# Patient Record
Sex: Male | Born: 1965 | Race: Black or African American | Hispanic: No | Marital: Married | Smoking: Never smoker
Health system: Southern US, Community
[De-identification: ages and names within clinical notes are randomized; demographics above are authoritative.]

## PROBLEM LIST (undated history)

## (undated) DIAGNOSIS — T7840XA Allergy, unspecified, initial encounter: Secondary | ICD-10-CM

## (undated) DIAGNOSIS — S83006A Unspecified dislocation of unspecified patella, initial encounter: Secondary | ICD-10-CM

## (undated) DIAGNOSIS — F419 Anxiety disorder, unspecified: Secondary | ICD-10-CM

## (undated) DIAGNOSIS — E669 Obesity, unspecified: Secondary | ICD-10-CM

## (undated) DIAGNOSIS — R0902 Hypoxemia: Secondary | ICD-10-CM

## (undated) DIAGNOSIS — N200 Calculus of kidney: Secondary | ICD-10-CM

## (undated) DIAGNOSIS — J309 Allergic rhinitis, unspecified: Secondary | ICD-10-CM

## (undated) DIAGNOSIS — G4730 Sleep apnea, unspecified: Secondary | ICD-10-CM

## (undated) DIAGNOSIS — K649 Unspecified hemorrhoids: Secondary | ICD-10-CM

## (undated) DIAGNOSIS — K219 Gastro-esophageal reflux disease without esophagitis: Secondary | ICD-10-CM

## (undated) DIAGNOSIS — K529 Noninfective gastroenteritis and colitis, unspecified: Secondary | ICD-10-CM

## (undated) DIAGNOSIS — G4733 Obstructive sleep apnea (adult) (pediatric): Secondary | ICD-10-CM

## (undated) DIAGNOSIS — M109 Gout, unspecified: Secondary | ICD-10-CM

## (undated) HISTORY — PX: HEMORRHOID SURGERY: SHX153

## (undated) HISTORY — DX: Unspecified dislocation of unspecified patella, initial encounter: S83.006A

## (undated) HISTORY — PX: WISDOM TOOTH EXTRACTION: SHX21

## (undated) HISTORY — DX: Allergy, unspecified, initial encounter: T78.40XA

## (undated) HISTORY — DX: Unspecified hemorrhoids: K64.9

## (undated) HISTORY — DX: Hypoxemia: R09.02

## (undated) HISTORY — DX: Obesity, unspecified: E66.9

## (undated) HISTORY — DX: Anxiety disorder, unspecified: F41.9

## (undated) HISTORY — DX: Gout, unspecified: M10.9

## (undated) HISTORY — DX: Obstructive sleep apnea (adult) (pediatric): G47.33

## (undated) HISTORY — DX: Calculus of kidney: N20.0

## (undated) HISTORY — DX: Noninfective gastroenteritis and colitis, unspecified: K52.9

## (undated) HISTORY — DX: Sleep apnea, unspecified: G47.30

## (undated) HISTORY — DX: Gastro-esophageal reflux disease without esophagitis: K21.9

## (undated) HISTORY — DX: Allergic rhinitis, unspecified: J30.9

---

## 1998-07-11 ENCOUNTER — Ambulatory Visit: Admission: RE | Admit: 1998-07-11 | Discharge: 1998-07-11 | Payer: Self-pay | Admitting: Internal Medicine

## 1998-12-23 ENCOUNTER — Ambulatory Visit: Admission: RE | Admit: 1998-12-23 | Discharge: 1998-12-23 | Payer: Self-pay | Admitting: Pulmonary Disease

## 2000-08-19 ENCOUNTER — Encounter: Admission: RE | Admit: 2000-08-19 | Discharge: 2000-08-19 | Payer: Self-pay | Admitting: Internal Medicine

## 2000-08-19 ENCOUNTER — Encounter: Payer: Self-pay | Admitting: Internal Medicine

## 2004-05-19 DIAGNOSIS — N200 Calculus of kidney: Secondary | ICD-10-CM

## 2004-05-19 HISTORY — DX: Calculus of kidney: N20.0

## 2004-06-07 ENCOUNTER — Ambulatory Visit: Payer: Self-pay | Admitting: Internal Medicine

## 2004-06-08 ENCOUNTER — Ambulatory Visit: Payer: Self-pay | Admitting: Cardiology

## 2005-12-12 ENCOUNTER — Ambulatory Visit: Payer: Self-pay | Admitting: Internal Medicine

## 2006-02-03 ENCOUNTER — Ambulatory Visit: Payer: Self-pay | Admitting: Internal Medicine

## 2006-02-03 LAB — CONVERTED CEMR LAB
ALT: 22 units/L (ref 0–40)
AST: 20 units/L (ref 0–37)
Albumin: 4 g/dL (ref 3.5–5.2)
Alkaline Phosphatase: 78 units/L (ref 39–117)
BUN: 11 mg/dL (ref 6–23)
Basophils Absolute: 0.1 10*3/uL (ref 0.0–0.1)
Basophils Relative: 0.4 % (ref 0.0–1.0)
Bilirubin Urine: NEGATIVE
CO2: 28 meq/L (ref 19–32)
Calcium: 9.3 mg/dL (ref 8.4–10.5)
Chloride: 107 meq/L (ref 96–112)
Chol/HDL Ratio, serum: 3.3
Cholesterol: 110 mg/dL (ref 0–200)
Creatinine, Ser: 1.2 mg/dL (ref 0.4–1.5)
Eosinophil percent: 1.7 % (ref 0.0–5.0)
GFR calc non Af Amer: 71 mL/min
Glomerular Filtration Rate, Af Am: 86 mL/min/{1.73_m2}
Glucose, Bld: 103 mg/dL — ABNORMAL HIGH (ref 70–99)
HCT: 37.5 % — ABNORMAL LOW (ref 39.0–52.0)
HDL: 33.5 mg/dL — ABNORMAL LOW (ref >39.0)
Hemoglobin, Urine: NEGATIVE
Hemoglobin: 11.7 g/dL — ABNORMAL LOW (ref 13.0–17.0)
Ketones, ur: NEGATIVE mg/dL
LDL Cholesterol: 69 mg/dL (ref 0–99)
Leukocytes, UA: NEGATIVE
Lymphocytes Relative: 22.5 % (ref 12.0–46.0)
MCHC: 31.3 g/dL (ref 30.0–36.0)
MCV: 68.8 fL — ABNORMAL LOW (ref 78.0–100.0)
Monocytes Absolute: 0.6 10*3/uL (ref 0.2–0.7)
Monocytes Relative: 5.8 % (ref 3.0–11.0)
Neutro Abs: 6.7 10*3/uL (ref 1.4–7.7)
Neutrophils Relative %: 69.6 % (ref 43.0–77.0)
Nitrite: NEGATIVE
Platelets: 241 10*3/uL (ref 150–400)
Potassium: 4.1 meq/L (ref 3.5–5.1)
RBC: 5.44 M/uL (ref 4.22–5.81)
RDW: 16.7 % — ABNORMAL HIGH (ref 11.5–14.6)
Sodium: 142 meq/L (ref 135–145)
Specific Gravity, Urine: >=1.03 (ref 1.000–1.03)
TSH: 1.76 microintl units/mL (ref 0.35–5.50)
Total Bilirubin: 1.2 mg/dL (ref 0.3–1.2)
Total Protein, Urine: NEGATIVE mg/dL
Total Protein: 6.9 g/dL (ref 6.0–8.3)
Triglyceride fasting, serum: 36 mg/dL (ref 0–149)
Urine Glucose: NEGATIVE mg/dL
Urobilinogen, UA: 0.2 (ref 0.0–1.0)
VLDL: 7 mg/dL (ref 0–40)
WBC: 9.7 10*3/uL (ref 4.5–10.5)
pH: 6 (ref 5.0–8.0)

## 2006-02-07 ENCOUNTER — Ambulatory Visit: Payer: Self-pay | Admitting: Internal Medicine

## 2006-06-02 ENCOUNTER — Emergency Department (HOSPITAL_COMMUNITY): Admission: EM | Admit: 2006-06-02 | Discharge: 2006-06-02 | Payer: Self-pay | Admitting: Family Medicine

## 2007-04-28 ENCOUNTER — Encounter: Payer: Self-pay | Admitting: *Deleted

## 2007-04-28 DIAGNOSIS — G4733 Obstructive sleep apnea (adult) (pediatric): Secondary | ICD-10-CM | POA: Insufficient documentation

## 2007-04-28 DIAGNOSIS — Z8719 Personal history of other diseases of the digestive system: Secondary | ICD-10-CM

## 2007-04-28 DIAGNOSIS — S83006A Unspecified dislocation of unspecified patella, initial encounter: Secondary | ICD-10-CM

## 2007-04-28 DIAGNOSIS — Z9189 Other specified personal risk factors, not elsewhere classified: Secondary | ICD-10-CM | POA: Insufficient documentation

## 2007-04-28 DIAGNOSIS — J309 Allergic rhinitis, unspecified: Secondary | ICD-10-CM | POA: Insufficient documentation

## 2007-09-14 ENCOUNTER — Ambulatory Visit: Payer: Self-pay | Admitting: Internal Medicine

## 2007-09-14 DIAGNOSIS — B079 Viral wart, unspecified: Secondary | ICD-10-CM | POA: Insufficient documentation

## 2008-06-16 ENCOUNTER — Ambulatory Visit: Payer: Self-pay | Admitting: Internal Medicine

## 2008-06-16 LAB — CONVERTED CEMR LAB
ALT: 26 units/L (ref 0–53)
AST: 24 units/L (ref 0–37)
Albumin: 4.4 g/dL (ref 3.5–5.2)
Alkaline Phosphatase: 74 units/L (ref 39–117)
BUN: 11 mg/dL (ref 6–23)
Basophils Absolute: 0 10*3/uL (ref 0.0–0.1)
Basophils Relative: 0.2 % (ref 0.0–3.0)
Bilirubin Urine: NEGATIVE
Bilirubin, Direct: 0.3 mg/dL (ref 0.0–0.3)
CO2: 32 meq/L (ref 19–32)
Calcium: 9.6 mg/dL (ref 8.4–10.5)
Chloride: 107 meq/L (ref 96–112)
Cholesterol: 130 mg/dL (ref 0–200)
Creatinine, Ser: 1.1 mg/dL (ref 0.4–1.5)
Eosinophils Absolute: 0.1 10*3/uL (ref 0.0–0.7)
Eosinophils Relative: 1.1 % (ref 0.0–5.0)
GFR calc non Af Amer: 93.67 mL/min (ref >60)
Glucose, Bld: 82 mg/dL (ref 70–99)
HCT: 36.6 % — ABNORMAL LOW (ref 39.0–52.0)
HDL: 37.3 mg/dL — ABNORMAL LOW (ref >39.00)
Hemoglobin, Urine: NEGATIVE
Hemoglobin: 11.8 g/dL — ABNORMAL LOW (ref 13.0–17.0)
Ketones, ur: NEGATIVE mg/dL
LDL Cholesterol: 85 mg/dL (ref 0–99)
Leukocytes, UA: NEGATIVE
Lymphocytes Relative: 16.7 % (ref 12.0–46.0)
Lymphs Abs: 2 10*3/uL (ref 0.7–4.0)
MCHC: 32.2 g/dL (ref 30.0–36.0)
MCV: 68.4 fL — ABNORMAL LOW (ref 78.0–100.0)
Monocytes Absolute: 0.6 10*3/uL (ref 0.1–1.0)
Monocytes Relative: 5.3 % (ref 3.0–12.0)
Neutro Abs: 9.1 10*3/uL — ABNORMAL HIGH (ref 1.4–7.7)
Neutrophils Relative %: 76.7 % (ref 43.0–77.0)
Nitrite: NEGATIVE
PSA: 0.38 ng/mL (ref 0.10–4.00)
Platelets: 207 10*3/uL (ref 150.0–400.0)
Potassium: 4.3 meq/L (ref 3.5–5.1)
RBC: 5.35 M/uL (ref 4.22–5.81)
RDW: 18.1 % — ABNORMAL HIGH (ref 11.5–14.6)
Sodium: 142 meq/L (ref 135–145)
Specific Gravity, Urine: 1.015 (ref 1.000–1.030)
TSH: 1.43 microintl units/mL (ref 0.35–5.50)
Total Bilirubin: 1.8 mg/dL — ABNORMAL HIGH (ref 0.3–1.2)
Total CHOL/HDL Ratio: 3
Total Protein, Urine: NEGATIVE mg/dL
Total Protein: 7.7 g/dL (ref 6.0–8.3)
Triglycerides: 40 mg/dL (ref 0.0–149.0)
Urine Glucose: NEGATIVE mg/dL
Urobilinogen, UA: 1 (ref 0.0–1.0)
VLDL: 8 mg/dL (ref 0.0–40.0)
WBC: 11.8 10*3/uL — ABNORMAL HIGH (ref 4.5–10.5)
pH: 6.5 (ref 5.0–8.0)

## 2008-06-20 ENCOUNTER — Ambulatory Visit: Payer: Self-pay | Admitting: Internal Medicine

## 2008-12-08 ENCOUNTER — Ambulatory Visit: Payer: Self-pay | Admitting: Internal Medicine

## 2009-08-02 ENCOUNTER — Encounter: Payer: Self-pay | Admitting: Internal Medicine

## 2009-08-15 ENCOUNTER — Ambulatory Visit: Payer: Self-pay | Admitting: Internal Medicine

## 2009-08-15 LAB — CONVERTED CEMR LAB
ALT: 49 units/L (ref 0–53)
AST: 32 units/L (ref 0–37)
Albumin: 4.2 g/dL (ref 3.5–5.2)
Alkaline Phosphatase: 62 units/L (ref 39–117)
BUN: 18 mg/dL (ref 6–23)
Basophils Absolute: 0 10*3/uL (ref 0.0–0.1)
Basophils Relative: 0.3 % (ref 0.0–3.0)
Bilirubin Urine: NEGATIVE
Bilirubin, Direct: 0.2 mg/dL (ref 0.0–0.3)
CO2: 29 meq/L (ref 19–32)
Calcium: 9.1 mg/dL (ref 8.4–10.5)
Chloride: 106 meq/L (ref 96–112)
Cholesterol: 120 mg/dL (ref 0–200)
Creatinine, Ser: 1.1 mg/dL (ref 0.4–1.5)
Eosinophils Absolute: 0.1 10*3/uL (ref 0.0–0.7)
Eosinophils Relative: 1.2 % (ref 0.0–5.0)
GFR calc non Af Amer: 96.19 mL/min (ref >60)
Glucose, Bld: 91 mg/dL (ref 70–99)
HCT: 37.2 % — ABNORMAL LOW (ref 39.0–52.0)
HDL: 29.2 mg/dL — ABNORMAL LOW (ref >39.00)
Hemoglobin, Urine: NEGATIVE
Hemoglobin: 11.9 g/dL — ABNORMAL LOW (ref 13.0–17.0)
Hgb A1c MFr Bld: 5.9 % (ref 4.6–6.5)
Ketones, ur: NEGATIVE mg/dL
LDL Cholesterol: 76 mg/dL (ref 0–99)
Leukocytes, UA: NEGATIVE
Lymphocytes Relative: 19 % (ref 12.0–46.0)
Lymphs Abs: 2.1 10*3/uL (ref 0.7–4.0)
MCHC: 31.8 g/dL (ref 30.0–36.0)
MCV: 70.6 fL — ABNORMAL LOW (ref 78.0–100.0)
Monocytes Absolute: 0.6 10*3/uL (ref 0.1–1.0)
Monocytes Relative: 5.5 % (ref 3.0–12.0)
Neutro Abs: 8.1 10*3/uL — ABNORMAL HIGH (ref 1.4–7.7)
Neutrophils Relative %: 74 % (ref 43.0–77.0)
Nitrite: NEGATIVE
PSA: 0.4 ng/mL (ref 0.10–4.00)
Platelets: 258 10*3/uL (ref 150.0–400.0)
Potassium: 4.7 meq/L (ref 3.5–5.1)
RBC: 5.28 M/uL (ref 4.22–5.81)
RDW: 18.7 % — ABNORMAL HIGH (ref 11.5–14.6)
Sodium: 140 meq/L (ref 135–145)
Specific Gravity, Urine: 1.025 (ref 1.000–1.030)
TSH: 1 microintl units/mL (ref 0.35–5.50)
Total Bilirubin: 1.4 mg/dL — ABNORMAL HIGH (ref 0.3–1.2)
Total CHOL/HDL Ratio: 4
Total Protein, Urine: NEGATIVE mg/dL
Total Protein: 7 g/dL (ref 6.0–8.3)
Triglycerides: 75 mg/dL (ref 0.0–149.0)
Urine Glucose: NEGATIVE mg/dL
Urobilinogen, UA: 0.2 (ref 0.0–1.0)
VLDL: 15 mg/dL (ref 0.0–40.0)
WBC: 10.9 10*3/uL — ABNORMAL HIGH (ref 4.5–10.5)
pH: 6 (ref 5.0–8.0)

## 2009-08-17 ENCOUNTER — Ambulatory Visit: Payer: Self-pay | Admitting: Internal Medicine

## 2010-03-22 NOTE — Assessment & Plan Note (Signed)
Summary: physical--stc   Vital Signs:  Patient profile:   45 year old male Height:      70 inches Weight:      312 pounds BMI:     44.93 O2 Sat:      96 % on Room air Temp:     98.2 degrees F oral Pulse rate:   71 / minute BP sitting:   114 / 76  (left arm) Cuff size:   large  Vitals Entered By: Bill Salinas CMA (August 17, 2009 1:36 PM)  O2 Flow:  Room air CC: Pt here for CPX/ ab  Vision Screening:      Vision Comments: Normal eye exam April 2011   Primary Care Provider:  Jacques Navy MD  CC:  Pt here for CPX/ ab.  History of Present Illness: Patient presents for routine medical exam. IN the interval since his last visit he has been seen once for abdominal  pain which has resolved. He has no other intercurrent illness, injury or surgery. He has not been able to gain control of his weight with a gain of several pounds since his last exam. He has no specific complaints.   Preventive Screening-Counseling & Management  Alcohol-Tobacco     Alcohol drinks/day: 0     Smoking Status: never     Smoking Cessation Counseling: no  Current Medications (verified): 1)  Multivitamins   Tabs (Multiple Vitamin) .... Take One Tablet Once Daily 2)  Allegra 180 Mg  Tabs (Fexofenadine Hcl) .... Take One Tablet Once Daily 3)  Astepro 0.15 % Soln (Azelastine Hcl) .... Use As Directed 4)  Nasonex 50 Mcg/act  Susp (Mometasone Furoate) .... Use Daily As Directed. 5)  Sulfamethoxazole-Tmp Ds 800-160 Mg Tabs (Sulfamethoxazole-Trimethoprim) .Marland Kitchen.. 1 By Mouth Two Times A Day X 5 For Cystitis  Allergies (verified): No Known Drug Allergies  Past History:  Past Medical History: Last updated: 04/28/2007 ALLERGIC RHINITIS (ICD-477.9) HEMORRHOIDS, HX OF (ICD-V12.79) GASTROENTERITIS, HX OF (ICD-V12.79) SLEEP APNEA (ICD-780.57) Hx of PATELLAR DISLOCATION, RIGHT (ICD-836.3)  Past Surgical History: Last updated: 04/28/2007 * HEMORRHOID INCISION AND DRAINAGE. WISDOM TEETH EXTRACTION, HX OF  (ICD-V15.9)    Family History: Last updated: 06/20/2008 father - 1940: CVA at 73; early dementia; HTN;  mother - 61: DM; chronic anemia; immune deficiency problem;  Neg-prostate or colon cancer.  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   Risk Factors: Alcohol Use: 0 (08/17/2009) Caffeine Use: 3 caffeinated beverages/wk (06/20/2008) Diet: needs low calorie diet.  (06/20/2008) Exercise: no (06/20/2008)  Risk Factors: Smoking Status: never (08/17/2009)  Review of Systems  The patient denies anorexia, fever, weight loss, weight gain, vision loss, decreased hearing, hoarseness, chest pain, syncope, dyspnea on exertion, peripheral edema, prolonged cough, headaches, abdominal pain, severe indigestion/heartburn, incontinence, genital sores, muscle weakness, suspicious skin lesions, difficulty walking, depression, abnormal bleeding, enlarged lymph nodes, angioedema, and testicular masses.    Physical Exam  General:  Obese AA male in no distress Head:  Normocephalic and atraumatic without obvious abnormalities. No apparent alopecia or balding. Eyes:  No corneal or conjunctival inflammation noted. EOMI. Perrla. Funduscopic exam benign, without hemorrhages, exudates or papilledema. Vision grossly normal. Ears:  External ear exam shows no significant lesions or deformities.  Otoscopic examination reveals clear canals, tympanic membranes  are intact bilaterally without bulging, retraction, inflammation or discharge. Hearing is grossly normal bilaterally. Nose:  no external deformity and no external erythema.   Mouth:  Oral mucosa and oropharynx without lesions or exudates.  Teeth in good repair. Neck:  supple, full ROM, no thyromegaly, and no carotid  bruits.   Chest Wall:  No deformities, masses, tenderness or gynecomastia noted. Lungs:  Normal respiratory effort, chest expands symmetrically. Lungs are clear to auscultation, no crackles or wheezes. Heart:  Normal rate and regular rhythm. S1 and S2 normal without gallop, murmur, click, rub or other extra sounds. Abdomen:  obese, soft, non-tender, and normal bowel sounds.   Prostate:  deferred to normal PSA Msk:  normal ROM, no joint tenderness, no joint swelling, no joint warmth, no redness over joints, and no joint instability.   Pulses:  2+ radial pulses Extremities:  1+ distal LE edema, increased distal LE rubor Neurologic:  alert & oriented X3, cranial nerves II-XII intact, strength normal in all extremities, sensation intact to light touch, and gait normal.   Skin:  turgor normal, color normal, and no suspicious lesions.   Cervical Nodes:  no anterior cervical adenopathy and no posterior cervical adenopathy.   Psych:  Oriented X3, memory intact for recent and remote, normally interactive, and good eye contact.     Impression & Recommendations:  Problem # 1:  OBESITY, CLASS II (ICD-278.00) revisited counseling about weight management: Smart food choices; PORTION SIZE, PORTION SIZE, PORTION SIZE; regular exercise. [employer] -  250 lbs, GOAL to loose 2 lbs/month.  Problem # 2:  ALLERGIC RHINITIS (ICD-477.9) stable   His updated medication list for this problem includes:    Allegra 180 Mg Tabs (Fexofenadine hcl) .Marland Kitchen... Take one tablet once daily    Astepro 0.15 % Soln (Azelastine hcl) ..... Use as directed    Nasonex 50 Mcg/act Susp (Mometasone furoate) ..... Use daily as directed.  Problem # 3:  SLEEP APNEA (ICD-780.57) no report by patient of any on-going problems.   Problem # 4:  Preventive Health Care (ICD-V70.0) Unremarkable exam except for obesity. Lab results are normal. Last tetnus '06  In summary - a very nice man who is medically stable with the primary medical issue being  weight management.   Complete Medication List: 1)  Multivitamins Tabs (Multiple vitamin) .... Take one tablet once daily 2)  Allegra 180 Mg Tabs (Fexofenadine hcl) .... Take one tablet once daily 3)  Astepro 0.15 % Soln (Azelastine hcl) .... Use as directed 4)  Nasonex 50 Mcg/act Susp (Mometasone furoate) .... Use daily as directed. 5)  Sulfamethoxazole-tmp Ds 800-160 Mg Tabs (Sulfamethoxazole-trimethoprim) .Marland Kitchen.. 1 by mouth two times a day x 5 for cystitis  Patient: Randy Hendrix Note: All result statuses are Final unless otherwise noted.  Tests: (1) BMP (METABOL)   Sodium                    140 mEq/L                   135-145   Potassium                 4.7 mEq/L                   3.5-5.1   Chloride                  106 mEq/L  96-112   Carbon Dioxide            29 mEq/L                    19-32   Glucose                   91 mg/dL                    04-54   BUN                       18 mg/dL                    0-98   Creatinine                1.1 mg/dL                   1.1-9.1   Calcium                   9.1 mg/dL                   4.7-82.9   GFR                       96.19 mL/min                >60  Tests: (2) Lipid Panel (LIPID)   Cholesterol               120 mg/dL                   5-621     ATP III Classification            Desirable:  < 200 mg/dL                    Borderline High:  200 - 239 mg/dL               High:  > = 240 mg/dL   Triglycerides             75.0 mg/dL                  3.0-865.7     Normal:  <150 mg/dL     Borderline High:  846 - 199 mg/dL   HDL                  [L]  96.29 mg/dL                 >52.84   VLDL Cholesterol          15.0 mg/dL                  1.3-24.4   LDL Cholesterol           76 mg/dL                    0-10  CHO/HDL Ratio:  CHD Risk                             4                    Men          Women     1/2 Average  Risk     3.4          3.3     Average Risk          5.0          4.4     2X Average Risk           9.6          7.1     3X Average Risk          15.0          11.0                           Tests: (3) CBC Platelet w/Diff (CBCD)   White Cell Count     [H]  10.9 K/uL                   4.5-10.5   Red Cell Count            5.28 Mil/uL                 4.22-5.81   Hemoglobin           [L]  11.9 g/dL                   04.5-40.9   Hematocrit           [L]  37.2 %                      39.0-52.0   MCV                  [L]  70.6 fl                     78.0-100.0     Rechecked and verified result.   MCHC                      31.8 g/dL                   81.1-91.4   RDW                  [H]  18.7 %                      11.5-14.6   Platelet Count            258.0 K/uL                  150.0-400.0   Neutrophil %              74.0 %                      43.0-77.0   Lymphocyte %              19.0 %                      12.0-46.0   Monocyte %                5.5 %                       3.0-12.0   Eosinophils%              1.2 %  0.0-5.0   Basophils %               0.3 %                       0.0-3.0   Neutrophill Absolute [H]  8.1 K/uL                    1.4-7.7   Lymphocyte Absolute       2.1 K/uL                    0.7-4.0   Monocyte Absolute         0.6 K/uL                    0.1-1.0  Eosinophils, Absolute                             0.1 K/uL                    0.0-0.7   Basophils Absolute        0.0 K/uL                    0.0-0.1  Tests: (4) Hepatic/Liver Function Panel (HEPATIC)   Total Bilirubin      [H]  1.4 mg/dL                   4.0-9.8   Direct Bilirubin          0.2 mg/dL                   1.1-9.1   Alkaline Phosphatase      62 U/L                      39-117   AST                       32 U/L                      0-37   ALT                       49 U/L                      0-53   Total Protein             7.0 g/dL                    4.7-8.2   Albumin                   4.2 g/dL                    9.5-6.2  Tests: (5) TSH (TSH)   FastTSH                   1.00  uIU/mL                 0.35-5.50  Tests: (6) Prostate Specific Antigen (PSA)   PSA-Hyb                   0.40 ng/mL  0.10-4.00  Tests: (7) UDip Only (UDIP)   Color                     LT. YELLOW       RANGE:  Yellow;Lt. Yellow   Clarity                   CLEAR                       Clear   Specific Gravity          1.025                       1.000 - 1.030   Urine Ph                  6.0                         5.0-8.0   Protein                   NEGATIVE                    Negative   Urine Glucose             NEGATIVE                    Negative   Ketones                   NEGATIVE                    Negative   Urine Bilirubin           NEGATIVE                    Negative   Blood                     NEGATIVE                    Negative   Urobilinogen              0.2                         0.0 - 1.0   Leukocyte Esterace        NEGATIVE                    Negative   Nitrite                   NEGATIVE                    Negative  Tests: (8) Hemoglobin A1C (A1C)   Hemoglobin A1C            5.9 %                       4.6-6.5

## 2010-03-22 NOTE — Letter (Signed)
Summary: CMN/[facility]  CMN/[facility]   Imported By: Lester [place] 08/04/2009 07:44:52  _____________________________________________________________________  External Attachment:    Type:   Image     Comment:   External Document

## 2010-07-06 NOTE — Assessment & Plan Note (Signed)
[facility]                           PRIMARY CARE OFFICE NOTE   NAME:Randy Hendrix                MRN:          [id number]  DATE:02/09/2006                            DOB:          Dec 19, 1965    [name] is a pleasant 45 year old African-American gentleman who  presents for routine followup evaluation and exam.  Last physical exam  was March 05, 2003.  The patient's last office visit was December 12, 2005 for painful hemorrhoid requiring incision and drainage.   The patient reports that he is now feeling well.  He has had no  recurrent problems with his hemorrhoids and he feels he has healed  completely with no rectal pain or discomfort.   PAST MEDICAL HISTORY:  Well documented in my note of March 08, 2003  without significant change.   FAMILY HISTORY/SOCIAL HISTORY:  Likewise documented.  The patient does  continue to work for [employer].  He has now been there 17  years and gets 4 weeks of vacation.  His children are very active and  healthy.   CURRENT MEDICATIONS:  Multivitamin daily, Allegra 180 mg daily, Afrin  nasal spray daily, Nasonex daily, lidocaine hydrochloride patch as  needed.  Anucort HC 2.5 mg suppositories p.r.n.   REVIEW OF SYSTEMS:  The patient has had no fevers or chills.  He has had  a mild weight loss.  We discussed weight loss program with a target of  210 over 4 years with a rate of 18 to 20 pounds per year.  The patient  has had an eye exam in the last 24 months.  The patient is scheduled for  cavity repair.  He has had no cardiovascular, respiratory, GI, GU  complaints.  He does have mild carpal tunnel on the left, not requiring  splinting.  No dermatologic or neurological problems.   EXAMINATION:  Temperature was 97.5, blood pressure 115/70, pulse 71,  weight 288, height is 5 feet 10 inches.  GENERAL APPEARANCE:  This is an overweight African-American gentleman in  no acute distress.  HEENT:  Normocephalic, atraumatic.  Unremarkable.  Pupils are equal,  round, and reactive to light and accommodation.  NECK:  Supple without thyromegaly.  NODES:  Adenopathy is not noted in the cervical or supraclavicular  regions.  CHEST:  No CVA tenderness.  LUNGS:  Clear to auscultation and percussion.  CARDIOVASCULAR:  2+ radial pulses.  He had a quiet precordium.  He had a  regular rate and rhythm without murmurs, rubs, or gallops.  He had no  JVD.  No carotid bruits.  ABDOMEN:  Obese, soft, no guarding or rebound.  No organo-splenomegaly  was noted.  GENITALIA:  Normal male.  Bilaterally descended testicles without  masses.  RECTAL:  The patient has no residual scarring from hemorrhoid incision  and drainage.  No palpable hemorrhoids are present.  He had normal  sphincter tone.  Prostate was smooth, round, normal size and contour  without tenderness.  EXTREMITIES:  Without cyanosis, clubbing, or edema, deformity.  NEUROLOGIC:  Nonfocal.   DATABASE:  Hemoglobin 11.7 g, white count 9700 with a normal  differential.  Cholesterol 110, triglycerides 26, HDL 33.5, LDL 69.  Serum glucose 103.  Electrolytes are normal.  Kidney function normal  with a creatinine of 1.2 and a glomerular filtration rate of 86 ml per  minute.  Liver function tests normal.  Thyroid function normal with a  TSH of 1.76.  Urinalysis was negative.   ASSESSMENT AND PLAN:  1. Weight management.  Discussed this at length with the patient.  I      have advised smart food choices, single portions, and portion size      control.  Avoiding empty calories in snacks.  Plan is a weight loss      of 1 to 1.5 pounds per month with a total goal of 18 to 20 pounds      per year with a target weight of 210.  2. Hemorrhoids, resolved.  3. Chronic allergies.  Stable on his present regimen.  4. Sleep apnea.  The patient is using a continuous positive airway      pressure mask and reports that he has been sleeping and  resting      better.  5. Health maintenance.  The patient is current with a normal      examination as noted.  Laboratory as noted.  6. The patient is asked to return to see me on a p.r.n. basis or in 1      year.     Rosalyn Gess Norins, MD  Electronically Signed    MEN/MedQ  DD: 02/09/2006  DT: 02/09/2006  Job #: [id number]   cc:   Adora Fridge

## 2011-03-19 ENCOUNTER — Telehealth: Payer: Self-pay | Admitting: *Deleted

## 2011-03-19 NOTE — Telephone Encounter (Signed)
Last ov May '00; last appt with Dr. Shelle Iron Nov '00. Needs OV with Dr. Shelle Iron for follow-up of CPAP

## 2011-03-19 NOTE — Telephone Encounter (Signed)
Patients wife called and left voice message stating she was advised to contact Dr Debby Bud by [facility] in [place].She is requesting a rx for cpap change.  Drinda Butts states the patients pressure is at 15.They are requesting this by Friday.

## 2011-03-20 NOTE — Telephone Encounter (Signed)
LMOM to Inform patient. 

## 2011-04-09 ENCOUNTER — Encounter: Payer: Self-pay | Admitting: Pulmonary Disease

## 2011-04-10 ENCOUNTER — Ambulatory Visit (INDEPENDENT_AMBULATORY_CARE_PROVIDER_SITE_OTHER): Payer: BC Managed Care – PPO | Admitting: Pulmonary Disease

## 2011-04-10 ENCOUNTER — Encounter: Payer: Self-pay | Admitting: Pulmonary Disease

## 2011-04-10 VITALS — BP 114/66 | HR 68 | Temp 97.7°F | Ht 70.0 in | Wt 321.4 lb

## 2011-04-10 DIAGNOSIS — G4730 Sleep apnea, unspecified: Secondary | ICD-10-CM

## 2011-04-10 NOTE — Assessment & Plan Note (Signed)
The patient has a history of very severe obstructive sleep apnea, but states that he has been doing well since his initial evaluation 13 years ago.  He has remained compliant with CPAP, but currently feels that he is not sleeping as well and is noting some daytime sleepiness.  His weight has increased by 30 pounds, and likely his pressure needs have increased.  He has a fairly new machine, but feels that it is making noise and possibly not functioning properly.  At this point, I would like to have his machine checked, get him new supplies and mask, and also re\re optimize his pressure in light of his weight gain and worsening symptoms.  I have also encouraged the patient to work aggressively on weight loss, and to followup with me on a yearly basis.

## 2011-04-10 NOTE — Progress Notes (Signed)
Subjective:    Patient ID: Randy Hendrix, male    DOB: 11/14/1965, 46 y.o.   MRN: [id number]  HPI The patient is a 46 your old male who I've been asked to see for management of obstructive sleep apnea.  He has a history of very severe sleep apnea, with an AHI of 124 events per hour.  He was started on CPAP and optimize to a pressure of 15 cm of water, but has not been seen since 2000.  He comes in today where he is having a little more of an issue with his sleep apnea, and is concerned that his machine may not be working properly.  His current machine is only 47-17 years old.  The patient states that his wife has noted an abnormal breathing pattern during sleep even on CPAP, and he states that he has had increased daytime sleepiness with periods of inactivity.  He states that his machine is louder than it used to be, and it is also noted that his weight is up 30 pounds since his last visit here.  He is currently using nasal pillows, and denies mouth opening.  His mask is only one year old by his history.  The patient's Epworth score today is 9  Sleep Questionnaire: What time do you typically go to bed?( Between what hours) 11:00-12:00 How long does it take you to fall asleep? 5 mins How many times during the night do you wake up? What time do you get out of bed to start your day? 0500 Do you drive or operate heavy machinery in your occupation? Yes How much has your weight changed (up or down) over the past two years? (In pounds) 20 lb (9.072 kg) Have you ever had a sleep study before? Yes If yes, location of study? [facility] If yes, date of study? 2000 Do you currently use CPAP? Yes If so, what pressure? ? Do you wear oxygen at any time? No     Review of Systems  Constitutional: Positive for unexpected weight change. Negative for fever.  HENT: Positive for sneezing. Negative for ear pain, nosebleeds, congestion, sore throat, rhinorrhea, trouble swallowing, dental problem, postnasal drip and sinus  pressure.   Eyes: Negative for redness and itching.  Respiratory: Negative for cough, chest tightness, shortness of breath and wheezing.   Cardiovascular: Negative for palpitations and leg swelling.  Gastrointestinal: Positive for abdominal pain. Negative for nausea and vomiting.  Genitourinary: Negative for dysuria.  Musculoskeletal: Negative for joint swelling.  Skin: Negative for rash.  Neurological: Negative for headaches.  Hematological: Does not bruise/bleed easily.  Psychiatric/Behavioral: Negative for dysphoric mood. The patient is not nervous/anxious.        Objective:   Physical Exam Constitutional:  Obese male, no acute distress  HENT:  Nares patent without discharge  Oropharynx without exudate, palate and uvula are very thickened and elongated.  Eyes:  Perrla, eomi, no scleral icterus  Neck:  No JVD, no TMG  Cardiovascular:  Normal rate, regular rhythm, no rubs or gallops.  No murmurs        Intact distal pulses  Pulmonary :  Normal breath sounds, no stridor or respiratory distress   No rales, rhonchi, or wheezing  Abdominal:  Soft, nondistended, bowel sounds present.  No tenderness noted.   Musculoskeletal:  2+ lower extremity edema noted.  Lymph Nodes:  No cervical lymphadenopathy noted  Skin:  No cyanosis noted  Neurologic:  Alert, appropriate, moves all 4 extremities without obvious deficit.  Assessment & Plan:

## 2011-04-10 NOTE — Patient Instructions (Signed)
Will have your dme check your current machine to see if in working order.  It will be up to insurance company whether you qualify for a new machine.  Will re-optimize your pressure on auto setting for the next 2 weeks.  Will call you with the results. Work on weight loss followup with me in one year, but call if having issues with cpap.

## 2011-04-12 ENCOUNTER — Emergency Department (HOSPITAL_COMMUNITY)
Admission: EM | Admit: 2011-04-12 | Discharge: 2011-04-12 | Disposition: A | Discharge status: Home / Self Care | Payer: BC Managed Care – PPO | Attending: Emergency Medicine | Admitting: Emergency Medicine

## 2011-04-12 ENCOUNTER — Encounter (HOSPITAL_COMMUNITY): Payer: Self-pay

## 2011-04-12 ENCOUNTER — Emergency Department (HOSPITAL_COMMUNITY): Payer: BC Managed Care – PPO

## 2011-04-12 ENCOUNTER — Ambulatory Visit: Payer: BC Managed Care – PPO | Admitting: Endocrinology

## 2011-04-12 DIAGNOSIS — R197 Diarrhea, unspecified: Secondary | ICD-10-CM | POA: Insufficient documentation

## 2011-04-12 DIAGNOSIS — G4733 Obstructive sleep apnea (adult) (pediatric): Secondary | ICD-10-CM | POA: Insufficient documentation

## 2011-04-12 DIAGNOSIS — R109 Unspecified abdominal pain: Secondary | ICD-10-CM | POA: Insufficient documentation

## 2011-04-12 LAB — CBC
HCT: 36.9 % — ABNORMAL LOW (ref 39.0–52.0)
Hemoglobin: 11.8 g/dL — ABNORMAL LOW (ref 13.0–17.0)
MCH: 21.7 pg — ABNORMAL LOW (ref 26.0–34.0)
MCV: 67.8 fL — ABNORMAL LOW (ref 78.0–100.0)
RBC: 5.44 MIL/uL (ref 4.22–5.81)

## 2011-04-12 LAB — URINALYSIS, ROUTINE W REFLEX MICROSCOPIC
Bilirubin Urine: NEGATIVE
Hgb urine dipstick: NEGATIVE
Specific Gravity, Urine: 1.025 (ref 1.005–1.030)
Urobilinogen, UA: 1 mg/dL (ref 0.0–1.0)
pH: 6 (ref 5.0–8.0)

## 2011-04-12 LAB — DIFFERENTIAL
Basophils Relative: 0 % (ref 0–1)
Eosinophils Relative: 1 % (ref 0–5)
Lymphs Abs: 1.6 10*3/uL (ref 0.7–4.0)
Monocytes Absolute: 0.4 10*3/uL (ref 0.1–1.0)
Neutro Abs: 8.2 10*3/uL — ABNORMAL HIGH (ref 1.7–7.7)

## 2011-04-12 LAB — COMPREHENSIVE METABOLIC PANEL
ALT: 31 U/L (ref 0–53)
CO2: 27 mEq/L (ref 19–32)
Calcium: 9.7 mg/dL (ref 8.4–10.5)
Creatinine, Ser: 1.12 mg/dL (ref 0.50–1.35)
GFR calc Af Amer: 89 mL/min — ABNORMAL LOW (ref >90)
GFR calc non Af Amer: 77 mL/min — ABNORMAL LOW (ref >90)
Glucose, Bld: 100 mg/dL — ABNORMAL HIGH (ref 70–99)
Sodium: 140 mEq/L (ref 135–145)

## 2011-04-12 MED ORDER — ONDANSETRON HCL 4 MG/2ML IJ SOLN
4.0000 mg | Freq: Once | INTRAMUSCULAR | Status: AC
  Administered 2011-04-12: 4 mg via INTRAVENOUS

## 2011-04-12 MED ORDER — SODIUM CHLORIDE 0.9 % IV SOLN
INTRAVENOUS | Status: DC
  Administered 2011-04-12: 11:00:00 via INTRAVENOUS

## 2011-04-12 MED ORDER — ONDANSETRON HCL 4 MG/2ML IJ SOLN
INTRAMUSCULAR | Status: AC
  Filled 2011-04-12: qty 2

## 2011-04-12 MED ORDER — FENTANYL CITRATE 0.05 MG/ML IJ SOLN
50.0000 ug | Freq: Once | INTRAMUSCULAR | Status: AC
  Administered 2011-04-12: 50 ug via INTRAVENOUS
  Filled 2011-04-12: qty 2

## 2011-04-12 NOTE — ED Provider Notes (Signed)
History     CSN: [id number]  Arrival date & time 04/12/11  1041   First MD Initiated Contact with Patient 04/12/11 1100      Chief Complaint  Patient presents with  . Abdominal Pain    also having diahreea and lt. rib pain     (Consider location/radiation/quality/duration/timing/severity/associated sxs/prior treatment) HPI  Past Medical History  Diagnosis Date  . Allergic rhinitis   . Hemorrhoid   . Gastroenteritis   . OSA (obstructive sleep apnea)   . Patellar dislocation     right    Past Surgical History  Procedure Date  . Hemorrhoid surgery   . Wisdom tooth extraction     Family History  Problem Relation Age of Onset  . Stroke Father   . Dementia Father   . Hypertension Father   . Diabetes Mother   . Anemia Mother   . Immunodeficiency Mother     History  Substance Use Topics  . Smoking status: Never Smoker   . Smokeless tobacco: Not on file  . Alcohol Use: No      Review of Systems  Allergies  Review of patient's allergies indicates no known allergies.  Home Medications   Current Outpatient Rx  Name Route Sig Dispense Refill  . AZELASTINE HCL 0.15 % NA SOLN Nasal Place 2 sprays into the nose daily.    Marland Kitchen VITAMIN B 12 PO Oral Take 1 tablet by mouth daily.    Marland Kitchen FEXOFENADINE HCL 180 MG PO TABS Oral Take 180 mg by mouth daily.    . OMEGA-3 FATTY ACIDS 1000 MG PO CAPS Oral Take 1 g by mouth daily.    Marland Kitchen GREEN TEA PO Oral Take 1 capsule by mouth daily.    . MOMETASONE FUROATE 50 MCG/ACT NA SUSP Nasal Place 2 sprays into the nose daily.    . MULTIVITAMINS PO CAPS Oral Take 1 capsule by mouth daily.      BP 113/69  Pulse 86  Temp(Src) 98.5 F (36.9 C) (Oral)  Resp 20  Ht 5\' 10"  (1.778 m)  Wt 312 lb (141.522 kg)  BMI 44.77 kg/m2  SpO2 98%  Physical Exam  ED Course  Procedures (including critical care time)  Labs Reviewed  CBC - Abnormal; Notable for the following:    Hemoglobin 11.8 (*)    HCT 36.9 (*)    MCV 67.8 (*)    MCH 21.7 (*)     RDW 18.5 (*)    All other components within normal limits  DIFFERENTIAL - Abnormal; Notable for the following:    Neutrophils Relative 79 (*)    Neutro Abs 8.2 (*)    All other components within normal limits  COMPREHENSIVE METABOLIC PANEL - Abnormal; Notable for the following:    Glucose, Bld 100 (*)    Total Bilirubin 1.3 (*)    GFR calc non Af Amer 77 (*)    GFR calc Af Amer 89 (*)    All other components within normal limits  URINALYSIS, ROUTINE W REFLEX MICROSCOPIC  LIPASE, BLOOD   Dg Ribs Unilateral W/chest Left  04/12/2011  *RADIOLOGY REPORT*  Clinical Data: Left rib pain after coughing  LEFT RIBS AND CHEST - 3+ VIEW  Comparison: None.  Findings: Heart and mediastinal contours are within normal limits. The lung fields appear clear with no signs of focal infiltrate or congestive failure.  No pleural fluid is suggested. No definite focal bony abnormality is identified around the thoracic cage  A metallic BB was utilized  to mark the location of the patient's pain.  Visualized ribs appear intact with the lower ribs well assessed.  No displaced or apparent nondisplaced rib fracture is identified.  Visualized thoracic and upper lumbar vertebral bodies appear intact.  IMPRESSION: No evidence for tussive rib fracture radiographically.  Clear chest.  Original Report Authenticated By: Bertha Stakes, M.D.     No diagnosis found.    MDM  Duplicate note        Doug Sou, MD 04/12/11 1721

## 2011-04-12 NOTE — ED Notes (Signed)
Complaint of diffuse abdominal pain onset yesterday pain has resolved since treatment in the emergency department. Patient reports multiple episodes watery diarrhea yesterday one episode today. No blood per him asymptomatic as I examine him on exam  alert nontoxic abdomen obese normal active bowel sounds nontender pain felt to be nonspecific. Plan bland diet for 24 hours avoid milk and milk products return as needed  Doug Sou, MD 04/12/11 1410

## 2011-04-12 NOTE — ED Provider Notes (Signed)
  Physical Exam  BP 101/84  Pulse 86  Temp(Src) 98.5 F (36.9 C) (Oral)  Resp 20  Ht 5\' 10"  (1.778 m)  Wt 312 lb (141.522 kg)  BMI 44.77 kg/m2  Physical Exam  ED Course  Procedures  MDM Patient had his left posterior rib pain after a cough couple weeks ago. Worse with moving and coughing. He also has had diarrhea for the last day. Decreased appetite but no nauseous or vomiting. No fevers. He will be      Juliet Rude. Rubin Payor, MD 04/12/11 [phone]

## 2011-04-12 NOTE — ED Notes (Signed)
Care assumed. Pt here with c/o Mid abd pain radiating to L side.denies urinary symptoms.denies nausea and vomiting but states diarrhea.states pain 3/10 at present.

## 2011-04-12 NOTE — ED Notes (Signed)
Pt states that he has been having generalized abdominal pain with nausea and diarrhea for the past couple of days. He states that recently he also tried to hold in a sneeze and felt like something pulled in his left sided rib area. He is having pain with movement and deep breathing. Alert and oriented. Protocols initiated.

## 2011-04-12 NOTE — Discharge Instructions (Signed)

## 2011-04-12 NOTE — ED Notes (Signed)
Lt. Rib pain when pt. Sneezes, denies any injury also having abdominal pain with diahreea symptoms began yesterday

## 2011-04-12 NOTE — ED Provider Notes (Signed)
History     CSN: [id number]  Arrival date & time 04/12/11  1041   First MD Initiated Contact with Patient 04/12/11 1100     11:47 AM HPI Patient reports the last 2 weeks has had pain in his left lower ribs. When he points, he points to his left lower quadrant. Reports pain is worse with sneezing, coughing, in moving. Reports yesterday had mild diarrhea. Denies nausea, vomiting, urinary symptoms, penile discharge, testicular tenderness, fevers. Denies history of abdominal surgeries. Reports pain improved after medication given in triage.  Patient is a 46 y.o. male presenting with abdominal pain. The history is provided by the patient and the spouse.  Abdominal Pain The primary symptoms of the illness include abdominal pain and diarrhea. The primary symptoms of the illness do not include fever, shortness of breath, nausea, vomiting or dysuria. Episode onset: 2 weeks. The onset of the illness was gradual. The problem has been gradually worsening.  The abdominal pain is located in the left flank. The abdominal pain does not radiate. The severity of the abdominal pain is 3/10. The abdominal pain is exacerbated by coughing and movement.  The patient has not had a change in bowel habit. Symptoms associated with the illness do not include chills, diaphoresis, heartburn, constipation, urgency, hematuria, frequency or back pain.    Past Medical History  Diagnosis Date  . Allergic rhinitis   . Hemorrhoid   . Gastroenteritis   . OSA (obstructive sleep apnea)   . Patellar dislocation     right    Past Surgical History  Procedure Date  . Hemorrhoid surgery   . Wisdom tooth extraction     Family History  Problem Relation Age of Onset  . Stroke Father   . Dementia Father   . Hypertension Father   . Diabetes Mother   . Anemia Mother   . Immunodeficiency Mother     History  Substance Use Topics  . Smoking status: Never Smoker   . Smokeless tobacco: Not on file  . Alcohol Use: No       Review of Systems  Constitutional: Negative for fever, chills and diaphoresis.  Respiratory: Negative for shortness of breath.   Cardiovascular: Negative for chest pain.  Gastrointestinal: Positive for abdominal pain and diarrhea. Negative for heartburn, nausea, vomiting, constipation, blood in stool and rectal pain.  Genitourinary: Negative for dysuria, urgency, frequency, hematuria, flank pain, discharge, penile pain and testicular pain.  Musculoskeletal: Negative for back pain.       Left rib pain  Neurological: Negative for dizziness, weakness, numbness and headaches.  All other systems reviewed and are negative.    Allergies  Review of patient's allergies indicates no known allergies.  Home Medications   Current Outpatient Rx  Name Route Sig Dispense Refill  . AZELASTINE HCL 0.15 % NA SOLN Nasal Place 2 sprays into the nose daily.    Marland Kitchen VITAMIN B 12 PO Oral Take 1 tablet by mouth daily.    Marland Kitchen FEXOFENADINE HCL 180 MG PO TABS Oral Take 180 mg by mouth daily.    . OMEGA-3 FATTY ACIDS 1000 MG PO CAPS Oral Take 1 g by mouth daily.    Marland Kitchen GREEN TEA PO Oral Take 1 capsule by mouth daily.    . MOMETASONE FUROATE 50 MCG/ACT NA SUSP Nasal Place 2 sprays into the nose daily.    . MULTIVITAMINS PO CAPS Oral Take 1 capsule by mouth daily.      BP 101/84  Pulse 86  Temp(Src)  98.5 F (36.9 C) (Oral)  Resp 20  Ht 5\' 10"  (1.778 m)  Wt 312 lb (141.522 kg)  BMI 44.77 kg/m2  Physical Exam  Vitals reviewed. Constitutional: He is oriented to person, place, and time. He appears well-developed and well-nourished.  HENT:  Head: Normocephalic and atraumatic.  Eyes: Conjunctivae are normal. Pupils are equal, round, and reactive to light.  Neck: Normal range of motion. Neck supple.  Cardiovascular: Normal rate, regular rhythm and normal heart sounds.   Pulmonary/Chest: Effort normal and breath sounds normal. No respiratory distress. He has no wheezes. He has no rales. He exhibits no  tenderness.  Abdominal: Soft. Bowel sounds are normal. He exhibits no distension and no mass. There is no tenderness. There is no rebound and no guarding.  Neurological: He is alert and oriented to person, place, and time.  Skin: Skin is warm and dry. No rash noted. No erythema. No pallor.  Psychiatric: He has a normal mood and affect. His behavior is normal.    ED Course  Procedures  Results for orders placed during the hospital encounter of 04/12/11  URINALYSIS, ROUTINE W REFLEX MICROSCOPIC      Component Value Range   Color, Urine YELLOW  YELLOW    APPearance CLEAR  CLEAR    Specific Gravity, Urine 1.025  1.005 - 1.030    pH 6.0  5.0 - 8.0    Glucose, UA NEGATIVE  NEGATIVE (mg/dL)   Hgb urine dipstick NEGATIVE  NEGATIVE    Bilirubin Urine NEGATIVE  NEGATIVE    Ketones, ur NEGATIVE  NEGATIVE (mg/dL)   Protein, ur NEGATIVE  NEGATIVE (mg/dL)   Urobilinogen, UA 1.0  0.0 - 1.0 (mg/dL)   Nitrite NEGATIVE  NEGATIVE    Leukocytes, UA NEGATIVE  NEGATIVE   CBC      Component Value Range   WBC 10.3  4.0 - 10.5 (K/uL)   RBC 5.44  4.22 - 5.81 (MIL/uL)   Hemoglobin 11.8 (*) 13.0 - 17.0 (g/dL)   HCT 16.1 (*) 09.6 - 52.0 (%)   MCV 67.8 (*) 78.0 - 100.0 (fL)   MCH 21.7 (*) 26.0 - 34.0 (pg)   MCHC 32.0  30.0 - 36.0 (g/dL)   RDW 04.5 (*) 40.9 - 15.5 (%)   Platelets 185  150 - 400 (K/uL)  DIFFERENTIAL      Component Value Range   Neutrophils Relative 79 (*) 43 - 77 (%)   Lymphocytes Relative 16  12 - 46 (%)   Monocytes Relative 4  3 - 12 (%)   Eosinophils Relative 1  0 - 5 (%)   Basophils Relative 0  0 - 1 (%)   Neutro Abs 8.2 (*) 1.7 - 7.7 (K/uL)   Lymphs Abs 1.6  0.7 - 4.0 (K/uL)   Monocytes Absolute 0.4  0.1 - 1.0 (K/uL)   Eosinophils Absolute 0.1  0.0 - 0.7 (K/uL)   Basophils Absolute 0.0  0.0 - 0.1 (K/uL)   RBC Morphology POLYCHROMASIA PRESENT    COMPREHENSIVE METABOLIC PANEL      Component Value Range   Sodium 140  135 - 145 (mEq/L)   Potassium 3.8  3.5 - 5.1 (mEq/L)    Chloride 105  96 - 112 (mEq/L)   CO2 27  19 - 32 (mEq/L)   Glucose, Bld 100 (*) 70 - 99 (mg/dL)   BUN 15  6 - 23 (mg/dL)   Creatinine, Ser 8.11  0.50 - 1.35 (mg/dL)   Calcium 9.7  8.4 - 91.4 (mg/dL)  Total Protein 7.3  6.0 - 8.3 (g/dL)   Albumin 3.9  3.5 - 5.2 (g/dL)   AST 21  0 - 37 (U/L)   ALT 31  0 - 53 (U/L)   Alkaline Phosphatase 74  39 - 117 (U/L)   Total Bilirubin 1.3 (*) 0.3 - 1.2 (mg/dL)   GFR calc non Af Amer 77 (*) >90 (mL/min)   GFR calc Af Amer 89 (*) >90 (mL/min)  LIPASE, BLOOD      Component Value Range   Lipase 18  11 - 59 (U/L)   Dg Ribs Unilateral W/chest Left  04/12/2011  *RADIOLOGY REPORT*  Clinical Data: Left rib pain after coughing  LEFT RIBS AND CHEST - 3+ VIEW  Comparison: None.  Findings: Heart and mediastinal contours are within normal limits. The lung fields appear clear with no signs of focal infiltrate or congestive failure.  No pleural fluid is suggested. No definite focal bony abnormality is identified around the thoracic cage  A metallic BB was utilized to mark the location of the patient's pain.  Visualized ribs appear intact with the lower ribs well assessed.  No displaced or apparent nondisplaced rib fracture is identified.  Visualized thoracic and upper lumbar vertebral bodies appear intact.  IMPRESSION: No evidence for tussive rib fracture radiographically.  Clear chest.  Original Report Authenticated By: Bertha Stakes, M.D.   MDM   1:41 PM History reports pain is a 1/10. Discussed differential diagnosis with patient that he may just have muscular pain pain is worse with movement, sneezing, and coughing. Also discussed it could be an early systemic infection with history of recent diarrhea. Spouse reports he is eating a lot of nuts seeds and is concerned he may have diverticulitis. Slight elevation in neutrophil count. Potentially the patient could have a diverticulitis however does not appear to be in acute distress. Discharge with muscle relaxants  antiemetics and advised followup with Dr. Alvera Novel. Discussed reasons for antibiotic use. Patient ready for discharge and voices understanding we discharged instructions.    Thomasene Lot, PA-C 04/12/11 1400

## 2011-04-12 NOTE — ED Provider Notes (Signed)
Medical screening examination/treatment/procedure(s) were conducted as a shared visit with non-physician practitioner(s) and myself.  I personally evaluated the patient during the encounter  Doug Sou, MD 04/12/11 1723

## 2011-05-20 ENCOUNTER — Encounter: Payer: Self-pay | Admitting: Internal Medicine

## 2011-05-21 ENCOUNTER — Encounter: Payer: BC Managed Care – PPO | Admitting: Internal Medicine

## 2011-05-21 DIAGNOSIS — Z0289 Encounter for other administrative examinations: Secondary | ICD-10-CM

## 2011-05-27 ENCOUNTER — Telehealth: Payer: Self-pay | Admitting: *Deleted

## 2011-05-27 ENCOUNTER — Other Ambulatory Visit (INDEPENDENT_AMBULATORY_CARE_PROVIDER_SITE_OTHER): Payer: BC Managed Care – PPO

## 2011-05-27 ENCOUNTER — Ambulatory Visit (INDEPENDENT_AMBULATORY_CARE_PROVIDER_SITE_OTHER): Payer: BC Managed Care – PPO | Admitting: Internal Medicine

## 2011-05-27 ENCOUNTER — Encounter: Payer: Self-pay | Admitting: Internal Medicine

## 2011-05-27 VITALS — BP 112/70 | HR 83 | Temp 98.0°F | Resp 16 | Ht 70.0 in | Wt 322.0 lb

## 2011-05-27 DIAGNOSIS — Z87442 Personal history of urinary calculi: Secondary | ICD-10-CM

## 2011-05-27 DIAGNOSIS — R109 Unspecified abdominal pain: Secondary | ICD-10-CM

## 2011-05-27 LAB — URINALYSIS
Hgb urine dipstick: NEGATIVE
Urine Glucose: NEGATIVE
Urobilinogen, UA: 0.2 (ref 0.0–1.0)

## 2011-05-27 MED ORDER — HYDROCODONE-ACETAMINOPHEN 10-325 MG PO TABS: 1.0000 | ORAL_TABLET | Freq: Three times a day (TID) | ORAL | Status: AC | PRN

## 2011-05-27 MED ORDER — PROMETHAZINE HCL 25 MG PO TABS: 25.0000 mg | ORAL_TABLET | Freq: Four times a day (QID) | ORAL | Status: DC | PRN

## 2011-05-27 NOTE — Telephone Encounter (Signed)
We do what we can do.

## 2011-05-27 NOTE — Patient Instructions (Signed)
Kidney Stones Kidney stones (ureteral lithiasis) are solid masses that form inside your kidneys. The intense pain is caused by the stone moving through the kidney, ureter, bladder, and urethra (urinary tract). When the stone moves, the ureter starts to spasm around the stone. The stone is usually passed in the urine.  HOME CARE  Drink enough fluids to keep your pee (urine) clear or pale yellow. This helps to get the stone out.   Strain all pee through the provided strainer. Do not pee without peeing through the strainer, not even once. If you pee the stone out, catch it. The stone may be as small as a grain of salt. Take this to your doctor.   Only take medicine as told by your doctor.   Follow up with your doctor as told.   Get follow-up X-rays as told by your doctor.  GET HELP RIGHT AWAY IF:   Your pain does not get better with medicine.   You have a fever.   Your pain increases and gets worse over 18 hours.   You have new belly (abdominal) pain.   You feel faint or pass out.  MAKE SURE YOU:   Understand these instructions.   Will watch your condition.   Will get help right away if you are not doing well or get worse.  Document Released: 07/24/2007 Document Revised: 01/24/2011 Document Reviewed: 12/02/2008 [employer] Patient Information 2012 [employer]. 

## 2011-05-27 NOTE — Progress Notes (Signed)
  Subjective:    Patient ID: Randy Hendrix, male    DOB: [birth year], 46 y.o.   MRN: [id number]  HPI complains of L flank pain Feels similar to prior kidney stone Denies overuse or precipitating injury Onset of pain symptoms 3 days ago, currently resolved Pain intensity 6/10 3 days ago, relieved with over-the-counter ibuprofen Denies radiation of pain into abdomen or groin/testicle Prior kidney stone associated with uncontrolled nausea vomiting, but no intervention required  Past Medical History  Diagnosis Date  . Allergic rhinitis   . Hemorrhoid   . Gastroenteritis   . OSA (obstructive sleep apnea)     CPAP qhs  . ALLERGIC RHINITIS   . OBESITY, CLASS II   . PATELLAR DISLOCATION, RIGHT   . Kidney stone on right side 05/2004    Review of Systems  Constitutional: Negative for fever and fatigue.  Genitourinary: Positive for flank pain. Negative for dysuria, urgency, hematuria, decreased urine volume, difficulty urinating and testicular pain.  Skin: Negative for rash and wound.       Objective:   Physical Exam BP 112/70  Pulse 83  Temp(Src) 98 F (36.7 C) (Oral)  Resp 16  Ht 5\' 10"  (1.778 m)  Wt 322 lb (146.058 kg)  BMI 46.20 kg/m2  SpO2 96% Wt Readings from Last 3 Encounters:  05/27/11 322 lb (146.058 kg)  04/12/11 312 lb (141.522 kg)  04/10/11 321 lb 6.4 oz (145.786 kg)   Constitutional:  He is oveweight, but appears well-developed and well-nourished. No distress.  Neck: Normal range of motion. Neck supple. No JVD present. No thyromegaly present.  Cardiovascular: Normal rate, regular rhythm and normal heart sounds.  No murmur heard. no BLE edema Pulmonary/Chest: Effort normal and breath sounds normal. No respiratory distress. no wheezes.  Abdominal: Soft. Bowel sounds are normal. Patient exhibits no distension. There is no tenderness.  Musculoskeletal: Back: full range of motion of thoracic and lumbar spine. Non tender to palpation. Negative straight leg raise.  DTR's are symmetrically intact. Sensation intact in all dermatomes of the lower extremities. Full strength to manual muscle testing. patient is able to heel toe walk without difficulty and ambulates with antalgic gait. Skin: Skin is warm and dry.  No erythema or ulceration.  Psychiatric: he has a normal mood and affect. behavior is normal. Judgment and thought content normal.   Lab Results  Component Value Date   WBC 10.3 04/12/2011   HGB 11.8* 04/12/2011   HCT 36.9* 04/12/2011   PLT 185 04/12/2011   GLUCOSE 100* 04/12/2011   CHOL 120 08/15/2009   TRIG 75.0 08/15/2009   HDL 29.20* 08/15/2009   LDLCALC 76 08/15/2009   ALT 31 04/12/2011   AST 21 04/12/2011   NA 140 04/12/2011   K 3.8 04/12/2011   CL 105 04/12/2011   CREATININE 1.12 04/12/2011   BUN 15 04/12/2011   CO2 27 04/12/2011   TSH 1.00 08/15/2009   PSA 0.40 08/15/2009   HGBA1C 5.9 08/15/2009      Assessment & Plan:  L flank pain Hx prior kidney stone (similar to current pain) MSkel exam benign - no history to suggest back problem  Check UA - reviewed recent labs (normal BUN/Cr 03/2011) Norco prn pain unrelieved by ibuprofen  Also promethazine if needed for nausea (none at this time) If pain symptoms uncontrolled with current medications, or if other new symptoms such as fever, uncontrolled nausea/vomiting or new abdominal pain, patient will call for reevaluation as needed

## 2011-05-27 NOTE — Telephone Encounter (Signed)
LM on both home & mobile phone numbers to inform patient that if possible for him, per MEN to come on in to office where we can do urinalysis and see if there is further need for testing today for possible kidney stone; per Harriett Sine, when wife called to schedule OV this AM, she stated that patient could not get here earlier do to work.

## 2011-06-01 ENCOUNTER — Other Ambulatory Visit: Payer: Self-pay | Admitting: Internal Medicine

## 2011-06-09 ENCOUNTER — Other Ambulatory Visit: Payer: Self-pay | Admitting: Pulmonary Disease

## 2011-06-10 ENCOUNTER — Telehealth: Payer: Self-pay | Admitting: Pulmonary Disease

## 2011-06-10 NOTE — Telephone Encounter (Signed)
Spoke to tammy @[facility] and she will redo the download on this pt and refax it Randy Hendrix

## 2011-07-25 ENCOUNTER — Ambulatory Visit (INDEPENDENT_AMBULATORY_CARE_PROVIDER_SITE_OTHER): Payer: BC Managed Care – PPO | Admitting: Internal Medicine

## 2011-07-25 ENCOUNTER — Encounter: Payer: Self-pay | Admitting: Internal Medicine

## 2011-07-25 VITALS — BP 110/68 | HR 72 | Temp 98.1°F | Resp 16 | Ht 70.0 in | Wt 315.0 lb

## 2011-07-25 DIAGNOSIS — Z0000 Encounter for general adult medical examination without abnormal findings: Secondary | ICD-10-CM

## 2011-07-25 DIAGNOSIS — G4733 Obstructive sleep apnea (adult) (pediatric): Secondary | ICD-10-CM

## 2011-07-25 DIAGNOSIS — E669 Obesity, unspecified: Secondary | ICD-10-CM

## 2011-07-28 ENCOUNTER — Encounter: Payer: Self-pay | Admitting: Internal Medicine

## 2011-07-28 DIAGNOSIS — Z0000 Encounter for general adult medical examination without abnormal findings: Secondary | ICD-10-CM | POA: Insufficient documentation

## 2011-07-28 NOTE — Assessment & Plan Note (Signed)
Tolerates and benefits from CPAP. Last saw Dr. Shelle Iron in February '13.  Plan -  no change in treatment.  Follow-up with Dr. Shelle Iron as directed.

## 2011-07-28 NOTE — Assessment & Plan Note (Signed)
Interval medical history negative. Physical exam notable for obesity, otherwise OK. Reviewed all recent lab - no indication for repeat labs. Blood sugar and cholesterol levels are in normal range. Immunizations - last tetanus in '06, good until 2016.  In summary - a very nice man who appears to be medically stable. His biggest health issue is his weight and he is willing to work on this.

## 2011-07-28 NOTE — Assessment & Plan Note (Signed)
Discussed the importance of weight management in regard to his overall health - a key risk factor and contributor to his chronic medical problems.  Plan: weight mangement - smart food choices - always taking the low calorie option, i.e. Baked not fried, pretzels over chips, etc.; PORTION SIZE CONTROL  - hand as a guide to portion size proportional to his frame; regular exercise - 3 times a week for 30 minutes with a target heart rate of 120 (multiple short episodes of exercise equal to single longer episode).  Target weight 220 lbs; goal - to loose 2 lbs per month ( 5 year project).

## 2011-07-28 NOTE — Progress Notes (Signed)
Subjective:    Patient ID: Randy Hendrix, male    DOB: 11/11/1965, 46 y.o.   MRN: [id number]  HPI Mr. Griffith presents for a wellness exam. He reports that he is feeling well and has not had any major illness, surgery or injury in the interval since his last visit. He is independent in all his activities of daily living. He has kept up with his dentist and has had an eye exam in the last 2 years. He does try to eat a healthy diet but does not have a regular exercise program.  Past Medical History  Diagnosis Date  . Allergic rhinitis   . Hemorrhoid   . Gastroenteritis   . OSA (obstructive sleep apnea)     CPAP qhs  . ALLERGIC RHINITIS   . OBESITY, CLASS II   . PATELLAR DISLOCATION, RIGHT   . Kidney stone on right side 05/2004   Past Surgical History  Procedure Date  . Hemorrhoid surgery   . Wisdom tooth extraction    Family History  Problem Relation Age of Onset  . Stroke Father   . Dementia Father   . Hypertension Father   . Diabetes Mother   . Anemia Mother   . Immunodeficiency Mother   . Colon cancer Neg Hx   . Prostate cancer Neg Hx   . Cancer Neg Hx   . Heart disease Neg Hx   . COPD Neg Hx    History   Social History  . Marital Status: Married    Spouse Name: Annetta    Number of Children: 2  . Years of Education: 12   Occupational History  . [employer] in maintenance   .  [employer]   Social History Main Topics  . Smoking status: Never Smoker   . Smokeless tobacco: Never Used  . Alcohol Use: No  . Drug Use: No  . Sexually Active: Yes -- Male partner(s)   Other Topics Concern  . 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 Married 1990. 1 daughter 2002 and 1 son 60. SO-multiple medical problems- but currently stable. Daughter w/ petit mal seizures. Marriage is in good health.         Review of Systems Constitutional:  Negative for fever, chills, activity change and  unexpected weight change.  HEENT:  Negative for hearing loss, ear pain, congestion, neck stiffness and postnasal drip. Negative for sore throat or swallowing problems. Negative for dental complaints.   Eyes: Negative for vision loss or change in visual acuity.  Respiratory: Negative for chest tightness and wheezing. Negative for DOE.   Cardiovascular: Negative for chest pain or palpitations. No decreased exercise tolerance Gastrointestinal: No change in bowel habit. No bloating or gas. No reflux or indigestion Genitourinary: Negative for urgency, frequency, flank pain and difficulty urinating.  Musculoskeletal: Negative for myalgias, back pain, arthralgias and gait problem.  Neurological: Negative for dizziness, tremors, weakness and headaches.  Hematological: Negative for adenopathy.  Psychiatric/Behavioral: Negative for behavioral problems and dysphoric mood.       Objective:   Physical Exam Filed Vitals:   07/25/11 1311  BP: 110/68  Pulse: 72  Temp: 98.1 F (36.7 C)  Resp: 16   Wt Readings from Last 3 Encounters:  07/25/11 315 lb (142.883 kg)  05/27/11 322 lb (146.058 kg)  04/12/11 312 lb (141.522 kg)    Gen'l: Well nourished well developed, obese AA male in no acute distress  HEENT: Head: Normocephalic and atraumatic. Right Ear: External ear normal. EAC/TM nl. Left Ear: External ear normal.  EAC/TM nl. Nose: Nose normal. Mouth/Throat: Oropharynx is clear and moist. Dentition - native, in good repair. No buccal or palatal lesions. Posterior pharynx clear. Eyes: Conjunctivae and sclera clear. EOM intact. Pupils are equal, round, and reactive to light. Right eye exhibits no discharge. Left eye exhibits no discharge. Neck: Normal range of motion. Neck supple. No JVD present. No tracheal deviation present. No thyromegaly present.  Cardiovascular: Normal rate, regular rhythm, no gallop, no friction rub, no murmur heard.      Quiet precordium. 2+ radial and DP pulses . No carotid  bruits Pulmonary/Chest: Effort normal. No respiratory distress or increased WOB, no wheezes, no rales. No chest wall deformity or CVAT. Abdominal: Soft. Bowel sounds are normal in all quadrants. He exhibits no distension, no tenderness, no rebound or guarding, No heptosplenomegaly  Genitourinary:  deferred Musculoskeletal: Normal range of motion. He exhibits no edema and no tenderness.       Small and large joints without redness, synovial thickening or deformity. Full range of motion preserved about all small, median and large joints.  Lymphadenopathy:    He has no cervical or supraclavicular adenopathy.  Neurological: He is alert and oriented to person, place, and time. CN II-XII intact. DTRs 2+ and symmetrical biceps, radial and patellar tendons. Cerebellar function normal with no tremor, rigidity, normal gait and station.  Skin: Skin is warm and dry. No rash noted. No erythema.  Psychiatric: He has a normal mood and affect. His behavior is normal. Thought content normal.   Lab Results  Component Value Date   WBC 10.3 04/12/2011   HGB 11.8* 04/12/2011   HCT 36.9* 04/12/2011   PLT 185 04/12/2011   GLUCOSE 100* 04/12/2011   CHOL 120 08/15/2009   TRIG 75.0 08/15/2009   HDL 29.20* 08/15/2009   LDLCALC 76 08/15/2009   ALT 31 04/12/2011   AST 21 04/12/2011   NA 140 04/12/2011   K 3.8 04/12/2011   CL 105 04/12/2011   CREATININE 1.12 04/12/2011   BUN 15 04/12/2011   CO2 27 04/12/2011   TSH 1.00 08/15/2009   PSA 0.40 08/15/2009   HGBA1C 5.9 08/15/2009           Assessment & Plan:

## 2011-12-04 ENCOUNTER — Ambulatory Visit: Payer: BC Managed Care – PPO

## 2011-12-11 ENCOUNTER — Ambulatory Visit (INDEPENDENT_AMBULATORY_CARE_PROVIDER_SITE_OTHER): Payer: BC Managed Care – PPO | Admitting: *Deleted

## 2011-12-11 DIAGNOSIS — Z23 Encounter for immunization: Secondary | ICD-10-CM

## 2012-01-29 ENCOUNTER — Ambulatory Visit: Payer: BC Managed Care – PPO | Admitting: Internal Medicine

## 2012-02-13 ENCOUNTER — Encounter: Payer: Self-pay | Admitting: Internal Medicine

## 2012-02-13 ENCOUNTER — Ambulatory Visit (INDEPENDENT_AMBULATORY_CARE_PROVIDER_SITE_OTHER): Payer: BC Managed Care – PPO | Admitting: Internal Medicine

## 2012-02-13 VITALS — BP 122/68 | HR 84 | Temp 98.0°F | Resp 12 | Wt 320.1 lb

## 2012-02-13 NOTE — Patient Instructions (Addendum)
  Weight management - this is the major threat to your health. Fortunately you do not have co-morbidities at this time. It is still a major health threat in the long run.  Plan  Weight management: smart food choices, portion size control and exercise. Try to loose 1-2 lbs a month. You should eat three meals a day. Snacks need to be in portions.  Sleep - getting adequate sleep is very important. Try to get your rest. Try to avoid excessive video: TV or other. Shoot for 6 hours sleep every night.  Come see me in June for your annual.

## 2012-02-15 NOTE — Assessment & Plan Note (Signed)
Discussed the high risk for medical illness and orthopedic illness associated with morbid obesity. Reviewed all his labs which are really ok. Reviewed the principles of weight management: smart food choices, portion size control including snacks and regular exercise. Reemphasized the health hazards and gave real encouragement to a gradual weight loss program. His wife is present and definitely reenforces the "nag."  Plan  weight management with goal of loosing 1-2 lbs/month.  (greater than 50% of 30 min visit spent on education and counseling)

## 2012-02-15 NOTE — Progress Notes (Signed)
Subjective:    Patient ID: Randy Hendrix, male    DOB: Jul 12, 1965, 46 y.o.   MRN: [id number]  HPI Randy Hendrix presents for follow up re: weight management. He has no complaints but does admit that he has had a hard time adhering to weight management regimen.  Past Medical History  Diagnosis Date  . Allergic rhinitis   . Hemorrhoid   . Gastroenteritis   . OSA (obstructive sleep apnea)     CPAP qhs  . ALLERGIC RHINITIS   . OBESITY, CLASS II   . PATELLAR DISLOCATION, RIGHT   . Kidney stone on right side 05/2004   Past Surgical History  Procedure Date  . Hemorrhoid surgery   . Wisdom tooth extraction    Family History  Problem Relation Age of Onset  . Stroke Father   . Dementia Father   . Hypertension Father   . Diabetes Mother   . Anemia Mother   . Immunodeficiency Mother   . Colon cancer Neg Hx   . Prostate cancer Neg Hx   . Cancer Neg Hx   . Heart disease Neg Hx   . COPD Neg Hx    History   Social History  . Marital Status: Married    Spouse Name: Annetta    Number of Children: 2  . Years of Education: 12   Occupational History  . [employer] in maintenance   .  [employer]   Social History Main Topics  . Smoking status: Never Smoker   . Smokeless tobacco: Never Used  . Alcohol Use: No  . Drug Use: No  . Sexually Active: Yes -- Male partner(s)   Other Topics Concern  . 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 Married 1990. 1 daughter 2002 and 1 son 53. SO-multiple medical problems- but currently stable. Daughter w/ petit mal seizures. Marriage is in good health.      Current Outpatient Prescriptions on File Prior to Visit  Medication Sig Dispense Refill  . Azelastine HCl (ASTEPRO) 0.15 % SOLN Place 2 sprays into the nose daily.      . Cyanocobalamin (VITAMIN B 12 PO) Take 1 tablet by mouth daily.      . fexofenadine (ALLEGRA) 180 MG tablet Take 180 mg by mouth daily.       . fish oil-omega-3 fatty acids 1000 MG capsule Take 1 g by mouth daily.      Chilton Si Tea, Camillia sinensis, (GREEN TEA PO) Take 1 capsule by mouth daily.      . mometasone (NASONEX) 50 MCG/ACT nasal spray Place 2 sprays into the nose daily.      . Multiple Vitamin (MULTIVITAMIN) capsule Take 1 capsule by mouth daily.      . promethazine (PHENERGAN) 25 MG tablet TAKE 1 TABLET (25 MG TOTAL) BY MOUTH EVERY 6 (SIX) HOURS  AS NEEDEDFOR  NAUSEA.  30 tablet  1      Review of Systems System review is negative for any constitutional, cardiac, pulmonary, GI or neuro symptoms or complaints other than as described in the HPI.      Objective:   Physical Exam Filed Vitals:   02/13/12 1454  BP: 122/68  Pulse: 84  Temp: 98 F (36.7 C)  Resp: 12   Wt Readings from Last 3 Encounters:  02/13/12 320 lb 1.9 oz (145.205 kg)  07/25/11 315 lb (142.883 kg)  05/27/11 322 lb (146.058 kg)  Gen'l- obese AA man in no distress Cor 2+ radial pulse, RRR Pulm - normal respirations Neuro - A&O x 3.        Assessment & Plan:

## 2012-04-19 ENCOUNTER — Emergency Department (INDEPENDENT_AMBULATORY_CARE_PROVIDER_SITE_OTHER)
Admission: EM | Admit: 2012-04-19 | Discharge: 2012-04-19 | Disposition: A | Discharge status: Home / Self Care | Payer: BC Managed Care – PPO | Source: Home / Self Care | Attending: Family Medicine | Admitting: Family Medicine

## 2012-04-19 ENCOUNTER — Encounter (HOSPITAL_COMMUNITY): Payer: Self-pay | Admitting: Emergency Medicine

## 2012-04-19 DIAGNOSIS — H1130 Conjunctival hemorrhage, unspecified eye: Secondary | ICD-10-CM

## 2012-04-19 DIAGNOSIS — H1131 Conjunctival hemorrhage, right eye: Secondary | ICD-10-CM

## 2012-04-19 NOTE — ED Notes (Signed)
Pt c/o redness of right eye since Friday evening. Pt denies injury, pain, and irritation.  Pt states "eye just turned red Friday evening"

## 2012-04-29 NOTE — ED Provider Notes (Signed)
History     CSN: [id number]  Arrival date & time 04/19/12  1224   First MD Initiated Contact with Patient 04/19/12 1248      Chief Complaint  Patient presents with  . Eye Problem    redness of right eye since friday evening.      Patient is a 47 y.o. male presenting with eye problem. The history is provided by the patient.  Eye Problem Location:  L eye Onset quality:  Sudden Duration:  2 days Timing:  Constant Progression:  Unchanged Chronicity:  New Context: contact lenses   Context: not burn, not chemical exposure, not direct trauma, not foreign body, not using machinery, not scratch, not smoke exposure and not tanning booth use   Associated symptoms: no blurred vision, no crusting, no decreased vision, no discharge, no foreign body sensation, no itching, no photophobia and no swelling   Risk factors: conjunctival hemorrhage   Risk factors: no previous injury to eye   Pt noted red spot on the sclera of his (L) eye on Friday evening whe he looked in the mirror. The area of redness has not progressed. Denies pain, visual disturbances or discharge. Denies trauma to the eye though admits he does a lot of heavy lifting as a part of his job.   Past Medical History  Diagnosis Date  . Allergic rhinitis   . Hemorrhoid   . Gastroenteritis   . OSA (obstructive sleep apnea)     CPAP qhs  . ALLERGIC RHINITIS   . OBESITY, CLASS II   . PATELLAR DISLOCATION, RIGHT   . Kidney stone on right side 05/2004    Past Surgical History  Procedure Laterality Date  . Hemorrhoid surgery    . Wisdom tooth extraction      Family History  Problem Relation Age of Onset  . Stroke Father   . Dementia Father   . Hypertension Father   . Diabetes Mother   . Anemia Mother   . Immunodeficiency Mother   . Colon cancer Neg Hx   . Prostate cancer Neg Hx   . Cancer Neg Hx   . Heart disease Neg Hx   . COPD Neg Hx     History  Substance Use Topics  . Smoking status: Never Smoker   . Smokeless  tobacco: Never Used  . Alcohol Use: No      Review of Systems  Eyes: Negative for blurred vision, photophobia, discharge and itching.  All other systems reviewed and are negative.    Allergies  Review of patient's allergies indicates no known allergies.  Home Medications   Current Outpatient Rx  Name  Route  Sig  Dispense  Refill  . Azelastine HCl (ASTEPRO) 0.15 % SOLN   Nasal   Place 2 sprays into the nose daily.         . fexofenadine (ALLEGRA) 180 MG tablet   Oral   Take 180 mg by mouth daily.         . Cyanocobalamin (VITAMIN B 12 PO)   Oral   Take 1 tablet by mouth daily.         . fish oil-omega-3 fatty acids 1000 MG capsule   Oral   Take 1 g by mouth daily.         Chilton Si Tea, Camillia sinensis, (GREEN TEA PO)   Oral   Take 1 capsule by mouth daily.         . mometasone (NASONEX) 50 MCG/ACT nasal spray  Nasal   Place 2 sprays into the nose daily.         . Multiple Vitamin (MULTIVITAMIN) capsule   Oral   Take 1 capsule by mouth daily.         . promethazine (PHENERGAN) 25 MG tablet      TAKE 1 TABLET (25 MG TOTAL) BY MOUTH EVERY 6 (SIX) HOURS  AS NEEDEDFOR  NAUSEA.   30 tablet   1     BP 116/82  Pulse 75  Temp(Src) 99.5 F (37.5 C) (Oral)  Resp 18  SpO2 97%  Physical Exam  Constitutional: He appears well-developed and well-nourished.  HENT:  Head: Normocephalic and atraumatic.  Eyes: EOM are normal. Pupils are equal, round, and reactive to light. Right eye exhibits no chemosis, no discharge, no exudate and no hordeolum. No foreign body present in the right eye. Left eye exhibits no chemosis, no discharge, no exudate and no hordeolum. No foreign body present in the left eye. Right conjunctiva is not injected. Right conjunctiva has no hemorrhage. Left conjunctiva is not injected. Left conjunctiva has a hemorrhage. No scleral icterus.    Subconjunctival hemorrhage to outer aspect of conjunctiva of to (L) eye that extends to but  does not involve the iris. . No other findings c/w inflammation/infectious process.   Neck: Neck supple.    ED Course  Procedures (including critical care time)  Labs Reviewed - No data to display No results found.   1. Subconjunctival hemorrhage, right       MDM  2 day h/o spontaneous subconjunctival hemorrhage of (L) eye. No trauma. No findings c/w infectious/inflammatory process. Pt does do a lot of heavy lifting on his job. Pt not on anticoagulants. Scheduled for eye exam with his ophthalmologist on 05/09/2012. Pt reassured. Web based pt education print outs provided. Pt encouraged to keep scheduled appointment w/ ophthalmologist.         Roma Kayser Sharrieff Spratlin, NP 04/30/12 0023

## 2012-05-05 NOTE — ED Provider Notes (Signed)
Medical screening examination/treatment/procedure(s) were performed by resident physician or non-physician practitioner and as supervising physician I was immediately available for consultation/collaboration.   Barkley Bruns MD.   Linna Hoff, MD 05/05/12 [phone]

## 2013-11-16 ENCOUNTER — Emergency Department (HOSPITAL_COMMUNITY)
Admission: EM | Admit: 2013-11-16 | Discharge: 2013-11-16 | Disposition: A | Discharge status: Home / Self Care | Payer: BC Managed Care – PPO | Source: Home / Self Care | Attending: Family Medicine | Admitting: Family Medicine

## 2013-11-16 ENCOUNTER — Emergency Department (INDEPENDENT_AMBULATORY_CARE_PROVIDER_SITE_OTHER): Payer: BC Managed Care – PPO

## 2013-11-16 ENCOUNTER — Encounter (HOSPITAL_COMMUNITY): Payer: Self-pay | Admitting: Emergency Medicine

## 2013-11-16 DIAGNOSIS — M25572 Pain in left ankle and joints of left foot: Secondary | ICD-10-CM

## 2013-11-16 DIAGNOSIS — M25579 Pain in unspecified ankle and joints of unspecified foot: Secondary | ICD-10-CM

## 2013-11-16 LAB — POCT I-STAT, CHEM 8
BUN: 17 mg/dL (ref 6–23)
CHLORIDE: 101 meq/L (ref 96–112)
Calcium, Ion: 1.2 mmol/L (ref 1.12–1.23)
Creatinine, Ser: 1.4 mg/dL — ABNORMAL HIGH (ref 0.50–1.35)
GLUCOSE: 98 mg/dL (ref 70–99)
HEMATOCRIT: 38 % — AB (ref 39.0–52.0)
HEMOGLOBIN: 12.9 g/dL — AB (ref 13.0–17.0)
POTASSIUM: 4.2 meq/L (ref 3.7–5.3)
SODIUM: 137 meq/L (ref 137–147)
TCO2: 27 mmol/L (ref 0–100)

## 2013-11-16 LAB — URIC ACID: Uric Acid, Serum: 9.5 mg/dL — ABNORMAL HIGH (ref 4.0–7.8)

## 2013-11-16 MED ORDER — TRAMADOL HCL 50 MG PO TABS: 50.0000 mg | ORAL_TABLET | Freq: Four times a day (QID) | ORAL | Status: DC | PRN

## 2013-11-16 MED ORDER — COLCHICINE 0.6 MG PO TABS: 0.6000 mg | ORAL_TABLET | Freq: Every day | ORAL | Status: DC

## 2013-11-16 NOTE — ED Notes (Signed)
Reports left ankle pain and swelling. On set Sunday.  States "with standing after sitting for a long period of time pain is at its worse but after up and walking around the pain eases up".  Denies any known injury.  Mild relief with aleve.  Reports the pain being a throbbing sensation.

## 2013-11-16 NOTE — ED Provider Notes (Signed)
Dickey Ashur Glatfelter is a 48 y.o. male who presents to Urgent Care today for left ankle pain. Patient is a three-day history of moderate left ankle pain and swelling. Patient denies any injury. The pain is located medially. Pain is worse with initial standing from a seated position. No radiating pain weakness or numbness. No history of gout.   Past Medical History  Diagnosis Date  . Allergic rhinitis   . Hemorrhoid   . Gastroenteritis   . OSA (obstructive sleep apnea)     CPAP qhs  . ALLERGIC RHINITIS   . OBESITY, CLASS II   . PATELLAR DISLOCATION, RIGHT   . Kidney stone on right side 05/2004   History  Substance Use Topics  . Smoking status: Never Smoker   . Smokeless tobacco: Never Used  . Alcohol Use: No   ROS as above Medications: No current facility-administered medications for this encounter.   Current Outpatient Prescriptions  Medication Sig Dispense Refill  . fexofenadine (ALLEGRA) 180 MG tablet Take 180 mg by mouth daily.      . Azelastine HCl (ASTEPRO) 0.15 % SOLN Place 2 sprays into the nose daily.      . colchicine 0.6 MG tablet Take 1 tablet (0.6 mg total) by mouth daily.  30 tablet  0  . Cyanocobalamin (VITAMIN B 12 PO) Take 1 tablet by mouth daily.      . fish oil-omega-3 fatty acids 1000 MG capsule Take 1 g by mouth daily.      Nyoka Cowden Tea, Camillia sinensis, (GREEN TEA PO) Take 1 capsule by mouth daily.      . mometasone (NASONEX) 50 MCG/ACT nasal spray Place 2 sprays into the nose daily.      . Multiple Vitamin (MULTIVITAMIN) capsule Take 1 capsule by mouth daily.      . promethazine (PHENERGAN) 25 MG tablet TAKE 1 TABLET (25 MG TOTAL) BY MOUTH EVERY 6 (SIX) HOURS  AS NEEDEDFOR  NAUSEA.  30 tablet  1  . traMADol (ULTRAM) 50 MG tablet Take 1 tablet (50 mg total) by mouth every 6 (six) hours as needed.  15 tablet  0    Exam:  BP 114/53  Pulse 83  Temp(Src) 98.1 F (36.7 C) (Oral)  Resp 16  SpO2 97% Gen: Well NAD morbidly obese HEENT: EOMI,  MMM Lungs:  Normal work of breathing. CTABL Heart: RRR no MRG Abd: NABS, Soft. Nondistended, Nontender Exts: Brisk capillary refill, warm and well perfused.  Left ankle: Moderate effusion. Tender palpation along the medial ankle joint line. Normal ankle motion is intact. Capillary refill pulses and sensation are intact.  Results for orders placed during the hospital encounter of 11/16/13 (from the past 24 hour(s))  POCT I-STAT, CHEM 8     Status: Abnormal   Collection Time    11/16/13  1:51 PM      Result Value Ref Range   Sodium 137  137 - 147 mEq/L   Potassium 4.2  3.7 - 5.3 mEq/L   Chloride 101  96 - 112 mEq/L   BUN 17  6 - 23 mg/dL   Creatinine, Ser 1.40 (*) 0.50 - 1.35 mg/dL   Glucose, Bld 98  70 - 99 mg/dL   Calcium, Ion 1.20  1.12 - 1.23 mmol/L   TCO2 27  0 - 100 mmol/L   Hemoglobin 12.9 (*) 13.0 - 17.0 g/dL   HCT 38.0 (*) 39.0 - 52.0 %   Dg Ankle Complete Left  11/16/2013   CLINICAL DATA:  48 year old male with left ankle pain and swelling for 3 days with no known injury. Initial encounter.  EXAM: LEFT ANKLE COMPLETE - 3+ VIEW  COMPARISON:  None.  FINDINGS: Large body habitus. Mortise joint alignment preserved, portable lateral view positioning suboptimal. Calcaneus intact. Talar dome intact. Distal tibia and fibula intact. No joint effusion identified. No acute osseous abnormality identified. No subcutaneous gas or radiopaque foreign body identified.  IMPRESSION: No acute osseous abnormality identified at the left ankle.   Electronically Signed   By: Lars Pinks M.D.   On: 11/16/2013 14:03    Assessment and Plan: 48 y.o. male with left ankle pain and effusion. Suspicious for gout flare. Uric acid pending. Treatment with colchicine tramadol. Followup with PCP  Discussed warning signs or symptoms. Please see discharge instructions. Patient expresses understanding.     Gregor Hams, MD 11/16/13 1414

## 2013-11-16 NOTE — Discharge Instructions (Signed)
Thank you for coming in today. Take colchicine daily for pain. His tramadol for severe pain. Followup with your primary care provider.   Gout Gout is an inflammatory arthritis caused by a buildup of uric acid crystals in the joints. Uric acid is a chemical that is normally present in the blood. When the level of uric acid in the blood is too high it can form crystals that deposit in your joints and tissues. This causes joint redness, soreness, and swelling (inflammation). Repeat attacks are common. Over time, uric acid crystals can form into masses (tophi) near a joint, destroying bone and causing disfigurement. Gout is treatable and often preventable. CAUSES  The disease begins with elevated levels of uric acid in the blood. Uric acid is produced by your body when it breaks down a naturally found substance called purines. Certain foods you eat, such as meats and fish, contain high amounts of purines. Causes of an elevated uric acid level include:  Being passed down from parent to child (heredity).  Diseases that cause increased uric acid production (such as obesity, psoriasis, and certain cancers).  Excessive alcohol use.  Diet, especially diets rich in meat and seafood.  Medicines, including certain cancer-fighting medicines (chemotherapy), water pills (diuretics), and aspirin.  Chronic kidney disease. The kidneys are no longer able to remove uric acid well.  Problems with metabolism. Conditions strongly associated with gout include:  Obesity.  High blood pressure.  High cholesterol.  Diabetes. Not everyone with elevated uric acid levels gets gout. It is not understood why some people get gout and others do not. Surgery, joint injury, and eating too much of certain foods are some of the factors that can lead to gout attacks. SYMPTOMS   An attack of gout comes on quickly. It causes intense pain with redness, swelling, and warmth in a joint.  Fever can occur.  Often, only one  joint is involved. Certain joints are more commonly involved:  Base of the big toe.  Knee.  Ankle.  Wrist.  Finger. Without treatment, an attack usually goes away in a few days to weeks. Between attacks, you usually will not have symptoms, which is different from many other forms of arthritis. DIAGNOSIS  Your caregiver will suspect gout based on your symptoms and exam. In some cases, tests may be recommended. The tests may include:  Blood tests.  Urine tests.  X-rays.  Joint fluid exam. This exam requires a needle to remove fluid from the joint (arthrocentesis). Using a microscope, gout is confirmed when uric acid crystals are seen in the joint fluid. TREATMENT  There are two phases to gout treatment: treating the sudden onset (acute) attack and preventing attacks (prophylaxis).  Treatment of an Acute Attack.  Medicines are used. These include anti-inflammatory medicines or steroid medicines.  An injection of steroid medicine into the affected joint is sometimes necessary.  The painful joint is rested. Movement can worsen the arthritis.  You may use warm or cold treatments on painful joints, depending which works best for you.  Treatment to Prevent Attacks.  If you suffer from frequent gout attacks, your caregiver may advise preventive medicine. These medicines are started after the acute attack subsides. These medicines either help your kidneys eliminate uric acid from your body or decrease your uric acid production. You may need to stay on these medicines for a very long time.  The early phase of treatment with preventive medicine can be associated with an increase in acute gout attacks. For this reason, during  the first few months of treatment, your caregiver may also advise you to take medicines usually used for acute gout treatment. Be sure you understand your caregiver's directions. Your caregiver may make several adjustments to your medicine dose before these medicines  are effective.  Discuss dietary treatment with your caregiver or dietitian. Alcohol and drinks high in sugar and fructose and foods such as meat, poultry, and seafood can increase uric acid levels. Your caregiver or dietitian can advise you on drinks and foods that should be limited. HOME CARE INSTRUCTIONS   Do not take aspirin to relieve pain. This raises uric acid levels.  Only take over-the-counter or prescription medicines for pain, discomfort, or fever as directed by your caregiver.  Rest the joint as much as possible. When in bed, keep sheets and blankets off painful areas.  Keep the affected joint raised (elevated).  Apply warm or cold treatments to painful joints. Use of warm or cold treatments depends on which works best for you.  Use crutches if the painful joint is in your leg.  Drink enough fluids to keep your urine clear or pale yellow. This helps your body get rid of uric acid. Limit alcohol, sugary drinks, and fructose drinks.  Follow your dietary instructions. Pay careful attention to the amount of protein you eat. Your daily diet should emphasize fruits, vegetables, whole grains, and fat-free or low-fat milk products. Discuss the use of coffee, vitamin C, and cherries with your caregiver or dietitian. These may be helpful in lowering uric acid levels.  Maintain a healthy body weight. SEEK MEDICAL CARE IF:   You develop diarrhea, vomiting, or any side effects from medicines.  You do not feel better in 24 hours, or you are getting worse. SEEK IMMEDIATE MEDICAL CARE IF:   Your joint becomes suddenly more tender, and you have chills or a fever. MAKE SURE YOU:   Understand these instructions.  Will watch your condition.  Will get help right away if you are not doing well or get worse. Document Released: 02/02/2000 Document Revised: 06/21/2013 Document Reviewed: 09/18/2011 [facility] Patient Information 2015 [place], [place]. This information is not intended to replace  advice given to you by your health care provider. Make sure you discuss any questions you have with your health care provider.

## 2013-11-19 ENCOUNTER — Telehealth (HOSPITAL_COMMUNITY): Payer: Self-pay | Admitting: *Deleted

## 2013-11-19 NOTE — ED Notes (Signed)
Uric Acid 9.5 H.  Treated with Colchicine and Tramadol.  Dr. Georgina Snell wrote to notify pt. That he has gout and to f/u with his PCP.  I called and left a message to call. Roselyn Meier 11/19/2013

## 2013-11-26 NOTE — ED Notes (Signed)
Unable to reach pt. by phone x 3.  Confidential marked letter sent with result and instructions to f/u with PCP. 11/26/2013

## 2014-04-11 ENCOUNTER — Ambulatory Visit (INDEPENDENT_AMBULATORY_CARE_PROVIDER_SITE_OTHER): Payer: 59 | Admitting: Internal Medicine

## 2014-04-11 ENCOUNTER — Encounter: Payer: Self-pay | Admitting: Internal Medicine

## 2014-04-11 VITALS — BP 118/84 | HR 79 | Temp 98.1°F | Resp 16 | Ht 70.0 in | Wt 324.1 lb

## 2014-04-11 DIAGNOSIS — M10072 Idiopathic gout, left ankle and foot: Secondary | ICD-10-CM

## 2014-04-11 DIAGNOSIS — M109 Gout, unspecified: Secondary | ICD-10-CM

## 2014-04-11 MED ORDER — NAPROXEN 500 MG PO TABS: 500.0000 mg | ORAL_TABLET | Freq: Two times a day (BID) | ORAL | Status: DC

## 2014-04-11 MED ORDER — COLCHICINE 0.6 MG PO TABS: 0.6000 mg | ORAL_TABLET | Freq: Every day | ORAL | Status: DC

## 2014-04-11 NOTE — Patient Instructions (Addendum)
We have sent in the colchicine that you will take daily for the gout. The other medicine is a prescription strength of naproxen which you can take twice a day for pain. Even when the pain is gone keep taking the colchicine.  Call us back in about 2 weeks and we will have you come in for blood work to see how the uric acid levels are. If they are high we will start a medicine for gout which helps to prevent you from having problems. It is important to check the uric acid levels when you are not in the middle of a flare because the levels can be changed up or down while you are having the pain. If we need to start this medicine we will need you to come back 4-6 weeks after starting the medicine.   I will ask you to come back 6-8 weeks from now (which is 4-6 weeks from starting medicine if we need to).   You are also overdue for a physical and should schedule that when you have time.  Gout Gout is an inflammatory arthritis caused by a buildup of uric acid crystals in the joints. Uric acid is a chemical that is normally present in the blood. When the level of uric acid in the blood is too high it can form crystals that deposit in your joints and tissues. This causes joint redness, soreness, and swelling (inflammation). Repeat attacks are common. Over time, uric acid crystals can form into masses (tophi) near a joint, destroying bone and causing disfigurement. Gout is treatable and often preventable. CAUSES  The disease begins with elevated levels of uric acid in the blood. Uric acid is produced by your body when it breaks down a naturally found substance called purines. Certain foods you eat, such as meats and fish, contain high amounts of purines. Causes of an elevated uric acid level include:  Being passed down from parent to child (heredity).  Diseases that cause increased uric acid production (such as obesity, psoriasis, and certain cancers).  Excessive alcohol use.  Diet, especially diets rich in  meat and seafood.  Medicines, including certain cancer-fighting medicines (chemotherapy), water pills (diuretics), and aspirin.  Chronic kidney disease. The kidneys are no longer able to remove uric acid well.  Problems with metabolism. Conditions strongly associated with gout include:  Obesity.  High blood pressure.  High cholesterol.  Diabetes. Not everyone with elevated uric acid levels gets gout. It is not understood why some people get gout and others do not. Surgery, joint injury, and eating too much of certain foods are some of the factors that can lead to gout attacks. SYMPTOMS   An attack of gout comes on quickly. It causes intense pain with redness, swelling, and warmth in a joint.  Fever can occur.  Often, only one joint is involved. Certain joints are more commonly involved:  Base of the big toe.  Knee.  Ankle.  Wrist.  Finger. Without treatment, an attack usually goes away in a few days to weeks. Between attacks, you usually will not have symptoms, which is different from many other forms of arthritis. DIAGNOSIS  Your caregiver will suspect gout based on your symptoms and exam. In some cases, tests may be recommended. The tests may include:  Blood tests.  Urine tests.  X-rays.  Joint fluid exam. This exam requires a needle to remove fluid from the joint (arthrocentesis). Using a microscope, gout is confirmed when uric acid crystals are seen in the joint fluid.  TREATMENT  There are two phases to gout treatment: treating the sudden onset (acute) attack and preventing attacks (prophylaxis).  Treatment of an Acute Attack.  Medicines are used. These include anti-inflammatory medicines or steroid medicines.  An injection of steroid medicine into the affected joint is sometimes necessary.  The painful joint is rested. Movement can worsen the arthritis.  You may use warm or cold treatments on painful joints, depending which works best for you.  Treatment  to Prevent Attacks.  If you suffer from frequent gout attacks, your caregiver may advise preventive medicine. These medicines are started after the acute attack subsides. These medicines either help your kidneys eliminate uric acid from your body or decrease your uric acid production. You may need to stay on these medicines for a very long time.  The early phase of treatment with preventive medicine can be associated with an increase in acute gout attacks. For this reason, during the first few months of treatment, your caregiver may also advise you to take medicines usually used for acute gout treatment. Be sure you understand your caregiver's directions. Your caregiver may make several adjustments to your medicine dose before these medicines are effective.  Discuss dietary treatment with your caregiver or dietitian. Alcohol and drinks high in sugar and fructose and foods such as meat, poultry, and seafood can increase uric acid levels. Your caregiver or dietitian can advise you on drinks and foods that should be limited. HOME CARE INSTRUCTIONS   Do not take aspirin to relieve pain. This raises uric acid levels.  Only take over-the-counter or prescription medicines for pain, discomfort, or fever as directed by your caregiver.  Rest the joint as much as possible. When in bed, keep sheets and blankets off painful areas.  Keep the affected joint raised (elevated).  Apply warm or cold treatments to painful joints. Use of warm or cold treatments depends on which works best for you.  Use crutches if the painful joint is in your leg.  Drink enough fluids to keep your urine clear or pale yellow. This helps your body get rid of uric acid. Limit alcohol, sugary drinks, and fructose drinks.  Follow your dietary instructions. Pay careful attention to the amount of protein you eat. Your daily diet should emphasize fruits, vegetables, whole grains, and fat-free or low-fat milk products. Discuss the use of  coffee, vitamin C, and cherries with your caregiver or dietitian. These may be helpful in lowering uric acid levels.  Maintain a healthy body weight. SEEK MEDICAL CARE IF:   You develop diarrhea, vomiting, or any side effects from medicines.  You do not feel better in 24 hours, or you are getting worse. SEEK IMMEDIATE MEDICAL CARE IF:   Your joint becomes suddenly more tender, and you have chills or a fever. MAKE SURE YOU:   Understand these instructions.  Will watch your condition.  Will get help right away if you are not doing well or get worse. Document Released: 02/02/2000 Document Revised: 06/21/2013 Document Reviewed: 09/18/2011 [facility] Patient Information 2015 [place], [place]. This information is not intended to replace advice given to you by your health care provider. Make sure you discuss any questions you have with your health care provider.

## 2014-04-11 NOTE — Progress Notes (Signed)
Pre visit review using our clinic review tool, if applicable. No additional management support is needed unless otherwise documented below in the visit note. 

## 2014-04-12 DIAGNOSIS — M109 Gout, unspecified: Secondary | ICD-10-CM | POA: Insufficient documentation

## 2014-04-12 NOTE — Assessment & Plan Note (Signed)
Previous episode of gout in the same joint, resolved with colchicine. Uric acid during that episode 9.4 but cannot trust. Will do colchicine and naproxen for the pain. Come back in 2 weeks for labs and likely start medication for gout with goal uric acid <6. Needs to return for visit 4-6 weeks after starting that medication (if he does indeed need it).

## 2014-04-12 NOTE — Progress Notes (Signed)
   Subjective:    Patient ID: Randy Hendrix, male    DOB: 1965-03-31, 49 y.o.   MRN: [id number]  HPI The patient is a 49 YO man who is coming in today for ankle pain of 1 week duration. It did start acutely and hurt 10/10 pain. It was so tender to the touch he could not stand clothes on it. He has had one episode like it which resolved with colchicine after going to urgent care. He thought it would go away. He has not tried anything for it at home. It is a little better but still about 6/10 now. Denies any dietary changes or injury to the ankle. It was red at first and now is not so red. He does work nights and is on his feet a lot and this has kept him from work. Denies fevers or chills.   Review of Systems  Constitutional: Positive for activity change. Negative for fever, appetite change, fatigue and unexpected weight change.  Respiratory: Negative.   Cardiovascular: Negative.   Gastrointestinal: Negative.   Musculoskeletal: Positive for joint swelling, arthralgias and gait problem.  Skin: Positive for color change.  Neurological: Negative.       Objective:   Physical Exam  Constitutional: He appears well-developed and well-nourished.  HENT:  Head: Normocephalic and atraumatic.  Eyes: EOM are normal.  Neck: Normal range of motion.  Cardiovascular: Normal rate and regular rhythm.   Pulmonary/Chest: Effort normal and breath sounds normal.  Abdominal: Soft.  Skin: Skin is warm and dry. There is erythema.  Ankle with some redness around the joint and exquisitely tender to touch. Able to move in all directions.    Filed Vitals:   04/11/14 1103  BP: 118/84  Pulse: 79  Temp: 98.1 F (36.7 C)  TempSrc: Oral  Resp: 16  Height: 5\' 10"  (1.778 m)  Weight: 324 lb 1.3 oz (147.002 kg)  SpO2: 94%      Assessment & Plan:

## 2014-04-13 ENCOUNTER — Other Ambulatory Visit: Payer: Self-pay

## 2014-04-13 ENCOUNTER — Telehealth: Payer: Self-pay

## 2014-04-13 MED ORDER — COLCHICINE 0.6 MG PO CAPS: 0.6000 mg | ORAL_CAPSULE | Freq: Every day | ORAL | Status: DC | PRN

## 2014-04-13 NOTE — Telephone Encounter (Signed)
PA for Colchicine has been started via phone to [facility] and expedited as been added to the request.  Response should be received in 24 hrs.

## 2014-04-13 NOTE — Telephone Encounter (Signed)
PA needed for colchicine

## 2014-04-22 ENCOUNTER — Encounter: Payer: Self-pay | Admitting: Internal Medicine

## 2014-04-25 MED ORDER — MITIGARE 0.6 MG PO CAPS: 1.0000 | ORAL_CAPSULE | Freq: Every day | ORAL | Status: DC

## 2014-04-25 MED ORDER — ALLOPURINOL 100 MG PO TABS: 100.0000 mg | ORAL_TABLET | Freq: Every day | ORAL | Status: DC

## 2014-04-25 NOTE — Telephone Encounter (Signed)
Pt pharmacy called.

## 2014-04-25 NOTE — Telephone Encounter (Signed)
Pharmacy confirmed that the [place] is covered rx.

## 2014-04-25 NOTE — Addendum Note (Signed)
Addended by: Lowella Dandy on: 04/25/2014 12:43 PM   Modules accepted: Orders

## 2014-04-25 NOTE — Telephone Encounter (Signed)
Pt informed that pharmacy needs to order colchicine. Pt informed that allopurinol was also sent to pharmacy.

## 2014-08-20 ENCOUNTER — Ambulatory Visit (INDEPENDENT_AMBULATORY_CARE_PROVIDER_SITE_OTHER): Payer: 59 | Admitting: Emergency Medicine

## 2014-08-20 VITALS — BP 128/76 | HR 71 | Temp 98.6°F | Resp 18 | Ht 69.0 in | Wt 313.2 lb

## 2014-08-20 DIAGNOSIS — B029 Zoster without complications: Secondary | ICD-10-CM

## 2014-08-20 MED ORDER — VALACYCLOVIR HCL 1 G PO TABS: 1000.0000 mg | ORAL_TABLET | Freq: Three times a day (TID) | ORAL | Status: DC

## 2014-08-20 NOTE — Patient Instructions (Signed)

## 2014-08-20 NOTE — Progress Notes (Signed)
   Subjective:  This chart was scribed for Nena Jordan, MD by [facility], medical scribe at [facility].The patient was seen in exam room 09 and the patient's care was started at 12:52 PM.   Patient ID: Randy Hendrix, male    DOB: 1965-08-15, 49 y.o.   MRN: [id number] Chief Complaint  Patient presents with  . Insect Bite    x 3 days, back   HPI HPI Comments: Randy Hendrix is a 49 y.o. male who presents to [facility] complaining of a rash on his upper back first noticed three days ago. Dr. Doristine Devoid is his PCP.   Review of Systems  Skin: Positive for rash.      Objective:  BP 128/76 mmHg  Pulse 71  Temp(Src) 98.6 F (37 C)  Resp 18  Ht 5\' 9"  (1.753 m)  Wt 313 lb 3.2 oz (142.067 kg)  BMI 46.23 kg/m2  SpO2 98% Physical Exam  Vitals reviewed. CONSTITUTIONAL: Well developed/well nourished HEAD: Normocephalic/atraumatic EYES: EOMI/PERRL ENMT: Mucous membranes moist NECK: supple no meningeal signs SPINE/BACK:entire spine nontender CV: S1/S2 noted, no murmurs/rubs/gallops noted LUNGS: Lungs are clear to auscultation bilaterally, no apparent distress ABDOMEN: soft, nontender, no rebound or guarding, bowel sounds noted throughout abdomen GU:no cva tenderness NEURO: Pt is awake/alert/appropriate, moves all extremitiesx4.  No facial droop.   EXTREMITIES: pulses normal/equal, full ROM SKIN: warm, color normal. There is a cluster of vesicles right mid scapula. PSYCH: no abnormalities of mood noted, alert and oriented to situation.     Assessment & Plan:    Patient is shingles T5 dermatome on the right. We'll treat with Valtrex and recheck in 1 week if persistent pain.I personally performed the services described in this documentation, which was scribed in my presence. The recorded information has been reviewed and is accurate.  Nena Jordan, MD

## 2014-08-20 NOTE — Progress Notes (Signed)
° °  Subjective:  This chart was scribed for Nena Jordan, MD by [facility] medical scribe at [facility]The patient was seen in exam room 09 and the patient's care was started at 12:52 PM.   Patient ID: Randy Hendrix, male    DOB: [birth year], 49 y.o.   MRN: [id number] Chief Complaint  Patient presents with   Insect Bite    x 3 days, back   HPI HPI Comments: Randy Hendrix is a 49 y.o. male who presents to [facility] complaining of a rash on his upper back first noticed three days ago. Dr. Doristine Devoid is his PCP.   Review of Systems  Skin: Positive for rash.      Objective:  BP 128/76 mmHg   Pulse 71   Temp(Src) 98.6 F (37 C)   Resp 18   Ht 5\' 9"  (1.245 m)   Wt 313 lb 3.2 oz (142.067 kg)   BMI 46.23 kg/m2   SpO2 98% Physical Exam  Vitals reviewed. CONSTITUTIONAL: Well developed/well nourished HEAD: Normocephalic/atraumatic EYES: EOMI/PERRL ENMT: Mucous membranes moist NECK: supple no meningeal signs SPINE/BACK:entire spine nontender CV: S1/S2 noted, no murmurs/rubs/gallops noted LUNGS: Lungs are clear to auscultation bilaterally, no apparent distress ABDOMEN: soft, nontender, no rebound or guarding, bowel sounds noted throughout abdomen GU:no cva tenderness NEURO: Pt is awake/alert/appropriate, moves all extremitiesx4.  No facial droop.   EXTREMITIES: pulses normal/equal, full ROM SKIN: warm, color normal. There is a cluster of vesicles right mid scapula. PSYCH: no abnormalities of mood noted, alert and oriented to situation.     Assessment & Plan:    Patient is shingles T5 dermatome on the right. We'll treat with Valtrex and recheck in 1 week if persistent pain.I personally performed the services described in this documentation, which was scribed in my presence. The recorded information has been reviewed and is accurate.  Nena Jordan, MD

## 2014-11-30 ENCOUNTER — Encounter: Payer: 59 | Admitting: Internal Medicine

## 2014-12-15 ENCOUNTER — Encounter: Payer: Self-pay | Admitting: Internal Medicine

## 2014-12-15 ENCOUNTER — Ambulatory Visit (INDEPENDENT_AMBULATORY_CARE_PROVIDER_SITE_OTHER): Payer: 59 | Admitting: Internal Medicine

## 2014-12-15 ENCOUNTER — Encounter: Payer: 59 | Admitting: Internal Medicine

## 2014-12-15 VITALS — BP 116/74 | HR 91 | Temp 98.6°F | Ht 69.0 in | Wt 308.0 lb

## 2014-12-15 DIAGNOSIS — G4733 Obstructive sleep apnea (adult) (pediatric): Secondary | ICD-10-CM

## 2014-12-15 DIAGNOSIS — M109 Gout, unspecified: Secondary | ICD-10-CM | POA: Insufficient documentation

## 2014-12-15 DIAGNOSIS — Z23 Encounter for immunization: Secondary | ICD-10-CM | POA: Diagnosis not present

## 2014-12-15 DIAGNOSIS — Z0000 Encounter for general adult medical examination without abnormal findings: Secondary | ICD-10-CM

## 2014-12-15 MED ORDER — AZELASTINE HCL 0.15 % NA SOLN: 2.0000 | Freq: Every day | NASAL | Status: DC

## 2014-12-15 MED ORDER — MOMETASONE FUROATE 50 MCG/ACT NA SUSP: 2.0000 | Freq: Every day | NASAL | Status: DC

## 2014-12-15 NOTE — Assessment & Plan Note (Signed)
For pulm f/u , will refer

## 2014-12-15 NOTE — Addendum Note (Signed)
Addended by: Lyman Bishop on: 12/15/2014 04:49 PM   Modules accepted: Orders

## 2014-12-15 NOTE — Progress Notes (Signed)
Pre visit review using our clinic review tool, if applicable. No additional management support is needed unless otherwise documented below in the visit note. 

## 2014-12-15 NOTE — Assessment & Plan Note (Signed)

## 2014-12-15 NOTE — Progress Notes (Signed)
Subjective:    Patient ID: Randy Hendrix, male    DOB: Jul 01, 1965, 49 y.o.   MRN: [id number]  HPI  Here for wellness and f/u;  Overall doing ok;  Pt denies Chest pain, worsening SOB, DOE, wheezing, orthopnea, PND, worsening LE edema, palpitations, dizziness or syncope.  Pt denies neurological change such as new headache, facial or extremity weakness.  Pt denies polydipsia, polyuria, or low sugar symptoms. Pt states overall good compliance with treatment and medications, good tolerability, and has been trying to follow appropriate diet.  Pt denies worsening depressive symptoms, suicidal ideation or panic. No fever, night sweats, wt loss, loss of appetite, or other constitutional symptoms.  Pt states good ability with ADL's, has low fall risk, home safety reviewed and adequate, no other significant changes in hearing or vision, and only occasionally active with exercise. Due for tetanus, colonoscopy, OSA f/u. No new complaints Past Medical History  Diagnosis Date  . Allergic rhinitis   . Hemorrhoid   . Gastroenteritis   . OSA (obstructive sleep apnea)     CPAP qhs  . ALLERGIC RHINITIS   . OBESITY, CLASS II   . PATELLAR DISLOCATION, RIGHT   . Kidney stone on right side 05/2004   Past Surgical History  Procedure Laterality Date  . Hemorrhoid surgery    . Wisdom tooth extraction      reports that he has never smoked. He has never used smokeless tobacco. He reports that he does not drink alcohol or use illicit drugs. family history includes Anemia in his mother; Dementia in his father; Diabetes in his mother; Hypertension in his father; Immunodeficiency in his mother; Stroke in his father. There is no history of Colon cancer, Prostate cancer, Cancer, Heart disease, or COPD. No Known Allergies Current Outpatient Prescriptions on File Prior to Visit  Medication Sig Dispense Refill  . allopurinol (ZYLOPRIM) 100 MG tablet Take 1 tablet (100 mg total) by mouth daily. 30 tablet 6  .  Cyanocobalamin (VITAMIN B 12 PO) Take 1 tablet by mouth daily.    . fexofenadine (ALLEGRA) 180 MG tablet Take 180 mg by mouth daily.    . fish oil-omega-3 fatty acids 1000 MG capsule Take 1 g by mouth daily.    Nyoka Cowden Tea, Camillia sinensis, (GREEN TEA PO) Take 1 capsule by mouth daily.    . Multiple Vitamin (MULTIVITAMIN) capsule Take 1 capsule by mouth daily.    Marland Kitchen MITIGARE 0.6 MG CAPS Take 1 capsule by mouth daily. Daily until Uloric Acid levels are at normal range. Then it is to be taken as needed for flare ups. (Patient not taking: Reported on 08/20/2014) 30 capsule 2  . naproxen (NAPROSYN) 500 MG tablet Take 1 tablet (500 mg total) by mouth 2 (two) times daily with a meal. (Patient not taking: Reported on 08/20/2014) 30 tablet 0  . promethazine (PHENERGAN) 25 MG tablet TAKE 1 TABLET (25 MG TOTAL) BY MOUTH EVERY 6 (SIX) HOURS  AS NEEDEDFOR  NAUSEA. (Patient not taking: Reported on 04/11/2014) 30 tablet 1  . valACYclovir (VALTREX) 1000 MG tablet Take 1 tablet (1,000 mg total) by mouth 3 (three) times daily. (Patient not taking: Reported on 12/15/2014) 21 tablet 0   No current facility-administered medications on file prior to visit.    Review of Systems Constitutional: Negative for increased diaphoresis, other activity, appetite or siginficant weight change other than noted HENT: Negative for worsening hearing loss, ear pain, facial swelling, mouth sores and neck stiffness.   Eyes: Negative for  other worsening pain, redness or visual disturbance.  Respiratory: Negative for shortness of breath and wheezing  Cardiovascular: Negative for chest pain and palpitations.  Gastrointestinal: Negative for diarrhea, blood in stool, abdominal distention or other pain Genitourinary: Negative for hematuria, flank pain or change in urine volume.  Musculoskeletal: Negative for myalgias or other joint complaints.  Skin: Negative for color change and wound or drainage.  Neurological: Negative for syncope and  numbness. other than noted Hematological: Negative for adenopathy. or other swelling Psychiatric/Behavioral: Negative for hallucinations, SI, self-injury, decreased concentration or other worsening agitation.      Objective:   Physical Exam BP 116/74 mmHg  Pulse 91  Temp(Src) 98.6 F (37 C) (Oral)  Ht 5\' 9"  (1.753 m)  Wt 308 lb (139.708 kg)  BMI 45.46 kg/m2  SpO2 96% VS noted,  Constitutional: Pt is oriented to person, place, and time. Appears well-developed and well-nourished, in no significant distress Head: Normocephalic and atraumatic.  Right Ear: External ear normal.  Left Ear: External ear normal.  Nose: Nose normal.  Mouth/Throat: Oropharynx is clear and moist.  Eyes: Conjunctivae and EOM are normal. Pupils are equal, round, and reactive to light.  Neck: Normal range of motion. Neck supple. No JVD present. No tracheal deviation present or significant neck LA or mass Cardiovascular: Normal rate, regular rhythm, normal heart sounds and intact distal pulses.   Pulmonary/Chest: Effort normal and breath sounds without rales or wheezing  Abdominal: Soft. Bowel sounds are normal. NT. No HSM  Musculoskeletal: Normal range of motion. Exhibits no edema.  Lymphadenopathy:  Has no cervical adenopathy.  Neurological: Pt is alert and oriented to person, place, and time. Pt has normal reflexes. No cranial nerve deficit. Motor grossly intact Skin: Skin is warm and dry. No rash noted.  Psychiatric:  Has normal mood and affect. Behavior is normal.     Assessment & Plan:

## 2014-12-15 NOTE — Patient Instructions (Addendum)
You had the Tetanus (Tdap) shot today  Please continue all other medications as before, and refills have been done if requested.  Please have the pharmacy call with any other refills you may need.  Please continue your efforts at being more active, low cholesterol diet, and weight control.  You are otherwise up to date with prevention measures today.  Please keep your appointments with your specialists as you may have planned  You will be contacted regarding the referral for: colonoscopy, and Pulmonary for the sleep apnea  Please go to the LAB in the Basement (turn left off the elevator) for the tests to be done today  You will be contacted by phone if any changes need to be made immediately.  Otherwise, you will receive a letter about your results with an explanation, but please check with MyChart first.  Please remember to sign up for MyChart if you have not done so, as this will be important to you in the future with finding out test results, communicating by private email, and scheduling acute appointments online when needed.  Please return in 6 months, or sooner if needed

## 2014-12-19 ENCOUNTER — Encounter: Payer: Self-pay | Admitting: Gastroenterology

## 2014-12-19 ENCOUNTER — Other Ambulatory Visit (INDEPENDENT_AMBULATORY_CARE_PROVIDER_SITE_OTHER): Payer: 59

## 2014-12-19 DIAGNOSIS — Z0000 Encounter for general adult medical examination without abnormal findings: Secondary | ICD-10-CM | POA: Diagnosis not present

## 2014-12-19 LAB — CBC WITH DIFFERENTIAL/PLATELET
BASOS ABS: 0.1 10*3/uL (ref 0.0–0.1)
Basophils Relative: 0.5 % (ref 0.0–3.0)
Eosinophils Absolute: 0.2 10*3/uL (ref 0.0–0.7)
Eosinophils Relative: 1.5 % (ref 0.0–5.0)
HCT: 37.4 % — ABNORMAL LOW (ref 39.0–52.0)
Hemoglobin: 11.6 g/dL — ABNORMAL LOW (ref 13.0–17.0)
LYMPHS ABS: 2.1 10*3/uL (ref 0.7–4.0)
Lymphocytes Relative: 19.6 % (ref 12.0–46.0)
MCHC: 31.1 g/dL (ref 30.0–36.0)
MCV: 68.7 fl — ABNORMAL LOW (ref 78.0–100.0)
MONO ABS: 0.5 10*3/uL (ref 0.1–1.0)
Monocytes Relative: 5 % (ref 3.0–12.0)
NEUTROS PCT: 73.4 % (ref 43.0–77.0)
Neutro Abs: 7.8 10*3/uL — ABNORMAL HIGH (ref 1.4–7.7)
Platelets: 186 10*3/uL (ref 150.0–400.0)
RBC: 5.45 Mil/uL (ref 4.22–5.81)
RDW: 17.9 % — ABNORMAL HIGH (ref 11.5–15.5)
WBC: 10.6 10*3/uL — ABNORMAL HIGH (ref 4.0–10.5)

## 2014-12-19 LAB — HEPATIC FUNCTION PANEL
ALBUMIN: 4 g/dL (ref 3.5–5.2)
ALK PHOS: 72 U/L (ref 39–117)
ALT: 30 U/L (ref 0–53)
AST: 21 U/L (ref 0–37)
Bilirubin, Direct: 0.2 mg/dL (ref 0.0–0.3)
Total Bilirubin: 1.1 mg/dL (ref 0.2–1.2)
Total Protein: 6.8 g/dL (ref 6.0–8.3)

## 2014-12-19 LAB — URINALYSIS, ROUTINE W REFLEX MICROSCOPIC
BILIRUBIN URINE: NEGATIVE
Ketones, ur: NEGATIVE
LEUKOCYTES UA: NEGATIVE
Nitrite: NEGATIVE
PH: 6 (ref 5.0–8.0)
Specific Gravity, Urine: 1.025 (ref 1.000–1.030)
TOTAL PROTEIN, URINE-UPE24: NEGATIVE
UROBILINOGEN UA: 0.2 (ref 0.0–1.0)
Urine Glucose: NEGATIVE

## 2014-12-19 LAB — BASIC METABOLIC PANEL
BUN: 12 mg/dL (ref 6–23)
CO2: 27 meq/L (ref 19–32)
Calcium: 9.5 mg/dL (ref 8.4–10.5)
Chloride: 105 mEq/L (ref 96–112)
Creatinine, Ser: 0.94 mg/dL (ref 0.40–1.50)
GFR: 109.18 mL/min (ref >60.00)
GLUCOSE: 110 mg/dL — AB (ref 70–99)
POTASSIUM: 4.2 meq/L (ref 3.5–5.1)
SODIUM: 139 meq/L (ref 135–145)

## 2014-12-19 LAB — LIPID PANEL
CHOLESTEROL: 105 mg/dL (ref 0–200)
HDL: 38 mg/dL — ABNORMAL LOW (ref >39.00)
LDL Cholesterol: 52 mg/dL (ref 0–99)
NonHDL: 67.44
TRIGLYCERIDES: 75 mg/dL (ref 0.0–149.0)
Total CHOL/HDL Ratio: 3
VLDL: 15 mg/dL (ref 0.0–40.0)

## 2014-12-19 LAB — TSH: TSH: 0.68 u[IU]/mL (ref 0.35–4.50)

## 2014-12-19 LAB — PSA: PSA: 0.36 ng/mL (ref 0.10–4.00)

## 2014-12-30 ENCOUNTER — Other Ambulatory Visit: Payer: Self-pay | Admitting: Internal Medicine

## 2015-01-03 ENCOUNTER — Ambulatory Visit (INDEPENDENT_AMBULATORY_CARE_PROVIDER_SITE_OTHER): Payer: 59 | Admitting: Internal Medicine

## 2015-01-03 ENCOUNTER — Encounter: Payer: Self-pay | Admitting: Internal Medicine

## 2015-01-03 VITALS — BP 132/78 | HR 89 | Ht 69.0 in | Wt 319.8 lb

## 2015-01-03 DIAGNOSIS — G4733 Obstructive sleep apnea (adult) (pediatric): Secondary | ICD-10-CM

## 2015-01-03 NOTE — Patient Instructions (Signed)
We need to arrange a download from you cpap machine but it appears to be working well   Weight control is simply a matter of calorie balance which needs to be tilted in your favor by eating less and exercising more.  To get the most out of exercise, you need to be continuously aware that you are short of breath, but never out of breath, for 30 minutes daily. As you improve, it will actually be easier for you to do the same amount of exercise  in  30 minutes so always push to the level where you are short of breath.  If this does not result in gradual weight reduction then I strongly recommend you see a nutritionist with a food diary x 2 weeks so that we can work out a negative calorie balance which is universally effective in steady weight loss programs.  Think of your calorie balance like you do your bank account where in this case you want the balance to go down so you must take in less calories than you burn up.  It's just that simple:  Hard to do, but easy to understand.  Good luck!   Sleep follow up in one year by Sleep medicine

## 2015-01-03 NOTE — Progress Notes (Signed)
Subjective:     Patient ID: Randy Hendrix, male   DOB: May 02, 1965,   MRN: [id number]  HPI  84 yobm with morbid obesity previously followed by Dr Gwenette Greet for osa last seen in 2013    01/03/2015 1st [facility] office visit/ [facility]   Chief Complaint  Patient presents with  . Pulmonary Consult    Former [facility] pt. Pt has OSA and wears CPAP nightly. Pt works 3rd shift and denies any increased somnolence. Pt denies cough/wheeze/SOB/CP.   Sleep Questionnaire: What time do you typically go to bed?( Between what hours) 730-8am How long does it take you to fall asleep? 5 mins How many times during the night do you wake up?  None  What time do you get out of bed to start your day? 2pm Do you drive or operate heavy machinery in your occupation? Sometimes  How much has your weight changed (up or down) over the past two years? (In pounds) 10 lb  Have you ever had a sleep study before? Yes If yes, location of study? [facility] If yes, date of study?  5/200  Do you currently use CPAP? Yes If so, what pressure? ? Don't know  you wear oxygen at any time? No  Epworth score 4     Wife satisfied he's no longer snoring or having apnea/ feels great and using cpap consistently but has gained wt since last study      Review of Systems  Constitutional: Negative.  Negative for fever and unexpected weight change.  HENT: Negative.  Negative for congestion, dental problem, ear pain, nosebleeds, postnasal drip, rhinorrhea, sinus pressure, sneezing, sore throat and trouble swallowing.   Eyes: Negative.  Negative for redness and itching.  Respiratory: Negative.  Negative for cough, chest tightness, shortness of breath and wheezing.   Cardiovascular: Negative.  Negative for palpitations and leg swelling.  Gastrointestinal: Negative.  Negative for nausea and vomiting.  Endocrine: Negative.   Genitourinary: Negative.  Negative for dysuria.  Musculoskeletal: Negative for joint swelling.  Skin: Negative for rash.   Allergic/Immunologic: Negative.   Neurological: Negative.  Negative for headaches.  Hematological: Negative.  Does not bruise/bleed easily.  Psychiatric/Behavioral: Negative.  Negative for dysphoric mood. The patient is not nervous/anxious.        Objective:   Physical Exam    amb pleasant bm nad  Wt Readings from Last 3 Encounters:  01/03/15 319 lb 12.8 oz (145.06 kg)  12/15/14 308 lb (139.708 kg)  08/20/14 313 lb 3.2 oz (142.067 kg)    Vital signs reviewed   HEENT: nl   turbinates, and oropharynx. Nl external ear canals without cough reflex - Modified Mallampati Score = 4    NECK :  without JVD/Nodes/TM/ nl carotid upstrokes bilaterally   LUNGS: no acc muscle use, clear to A and P bilaterally without cough on insp or exp maneuvers   CV:  RRR  no s3 or murmur or increase in P2, no edema   ABD:  soft and nontender with nl excursion in the supine position. No bruits or organomegaly, bowel sounds nl  MS:  warm without deformities, calf tenderness, cyanosis or clubbing  SKIN: warm and dry without lesions    NEURO:  alert, approp, no deficits   Assessment:

## 2015-01-04 ENCOUNTER — Encounter: Payer: Self-pay | Admitting: Internal Medicine

## 2015-01-04 NOTE — Assessment & Plan Note (Signed)
Body mass index is 47.2 kg/(m^2).  Lab Results  Component Value Date   TSH 0.68 12/19/2014     Contributing to gerd tendency/ doe/reviewed the need and the process to achieve and maintain neg calorie balance > defer f/u primary care including intermittently monitoring thyroid status

## 2015-01-04 NOTE — Assessment & Plan Note (Signed)
NPSG 2000:  AHI 124/hr CPAP titration 2000: optimal pressure 15cm.  - download requested 01/04/2015   Appears to be using cpap well but limited hours due to schedule / has gained further wt and may need titration   Will refer to sleep medicine for longterm f/u after review download for immediate needs  I had an extended discussion with the patient reviewing all relevant studies completed to date and  Lasting 25 minutes of a 64minute visit    Each maintenance medication was reviewed in detail including most importantly the difference between maintenance and prns and under what circumstances the prns are to be triggered using an action plan format that is not reflected in the computer generated alphabetically organized AVS.    Please see instructions for details which were reviewed in writing and the patient given a copy highlighting the part that I personally wrote and discussed at today's ov.

## 2015-03-02 ENCOUNTER — Telehealth: Payer: Self-pay | Admitting: Internal Medicine

## 2015-03-02 NOTE — Telephone Encounter (Signed)
Pt returning call.Randy Hendrix ° °

## 2015-03-02 NOTE — Telephone Encounter (Signed)
I spoke with patient about results and he verbalized understanding and had no questions 

## 2015-03-02 NOTE — Telephone Encounter (Signed)
Per MW- CPAP DL shows good compliance and no changes are needed  LMTCB for the pt

## 2015-03-17 ENCOUNTER — Encounter: Payer: 59 | Admitting: Gastroenterology

## 2015-04-21 ENCOUNTER — Encounter: Payer: Self-pay | Admitting: Gastroenterology

## 2015-06-06 ENCOUNTER — Ambulatory Visit (INDEPENDENT_AMBULATORY_CARE_PROVIDER_SITE_OTHER): Payer: 59 | Admitting: Podiatry

## 2015-06-06 ENCOUNTER — Encounter: Payer: Self-pay | Admitting: Podiatry

## 2015-06-06 VITALS — BP 127/77 | HR 90 | Resp 16

## 2015-06-06 DIAGNOSIS — B353 Tinea pedis: Secondary | ICD-10-CM | POA: Diagnosis not present

## 2015-06-06 DIAGNOSIS — L603 Nail dystrophy: Secondary | ICD-10-CM | POA: Diagnosis not present

## 2015-06-06 MED ORDER — NAFTIFINE HCL 2 % EX GEL: 1.0000 "application " | Freq: Two times a day (BID) | CUTANEOUS | Status: DC

## 2015-06-06 NOTE — Progress Notes (Signed)
   Subjective:    Patient ID: Randy Hendrix, male    DOB: [birth year], 50 y.o.   MRN: [id number]  HPI: He presents today with a chief complaint of painfully thick toenails that are discolored with malodor. His wife is also complaining of the malodor it is also complaining of itching between his toes.    Review of Systems  All other systems reviewed and are negative.      Objective:   Physical Exam: Vital signs are stable alert and oriented 3 pulses are palpable bilateral. Neurologic sensorium is intact. Deep tendon reflexes are intact muscle strength is normal bilateral. Orthopedic evaluationpes planus bilateral. Mild hammertoe deformities. Cutaneous evaluation of a straight supple well-hydrated cutis interdigital tinea pedis as noted xerotic skin plantar aspect of the foot with thick yellow dystrophic clinically mycotic and discolored nails.        Assessment & Plan:  Interdigital tinea pedis and onychomycosis nail dystrophy.  Plan: To sample of the nail and skin today to be similar pathologic evaluation. Started him on Naftin gel. I also discussed the use of aluminum chloride spray to help control swelling.

## 2015-06-12 ENCOUNTER — Telehealth: Payer: Self-pay | Admitting: Internal Medicine

## 2015-06-12 ENCOUNTER — Ambulatory Visit (AMBULATORY_SURGERY_CENTER): Payer: Self-pay | Admitting: *Deleted

## 2015-06-12 VITALS — Ht 70.0 in | Wt 324.0 lb

## 2015-06-12 DIAGNOSIS — Z1211 Encounter for screening for malignant neoplasm of colon: Secondary | ICD-10-CM

## 2015-06-12 MED ORDER — NA SULFATE-K SULFATE-MG SULF 17.5-3.13-1.6 GM/177ML PO SOLN
1.0000 | Freq: Once | ORAL | Status: DC
Start: 2015-06-12 — End: 2015-06-26

## 2015-06-12 NOTE — Telephone Encounter (Signed)
Ok with me 

## 2015-06-12 NOTE — Telephone Encounter (Signed)
Patient is requesting to change to dr crawford along with his wife.   Dr Jenny Reichmann,  Is this ok with you?  Dr Sharlet Salina,  Is this ok with you?

## 2015-06-12 NOTE — Progress Notes (Signed)
No egg or soy allergy known to patient  No issues with past sedation with any surgeries  or procedures, no intubation problems  Takes diet pills per patient but they do not contain phentermine  No home 02 use per patient  No blood thinners per patient  Pt denies issues with constipation  emmi video to   [email or web address]

## 2015-06-15 NOTE — Telephone Encounter (Signed)
Would recommend that I am not taking elective transfers right now but they can call back in the fall if still wanting to switch.

## 2015-06-16 NOTE — Telephone Encounter (Signed)
Called Randy Hendrix and advised. appt has already been scheduled for 09/25/2015

## 2015-06-26 ENCOUNTER — Encounter: Payer: Self-pay | Admitting: Gastroenterology

## 2015-06-26 ENCOUNTER — Ambulatory Visit (AMBULATORY_SURGERY_CENTER): Payer: 59 | Admitting: Gastroenterology

## 2015-06-26 VITALS — BP 100/66 | HR 77 | Temp 96.6°F | Resp 12 | Ht 70.0 in | Wt 324.0 lb

## 2015-06-26 DIAGNOSIS — D124 Benign neoplasm of descending colon: Secondary | ICD-10-CM | POA: Diagnosis not present

## 2015-06-26 DIAGNOSIS — D123 Benign neoplasm of transverse colon: Secondary | ICD-10-CM | POA: Diagnosis not present

## 2015-06-26 DIAGNOSIS — Z1211 Encounter for screening for malignant neoplasm of colon: Secondary | ICD-10-CM | POA: Diagnosis present

## 2015-06-26 MED ORDER — SODIUM CHLORIDE 0.9 % IV SOLN: 500.0000 mL | INTRAVENOUS | Status: DC

## 2015-06-26 NOTE — Progress Notes (Signed)
A/ox3, pleased with MAC, report to RN 

## 2015-06-26 NOTE — Progress Notes (Signed)
No problems noted in the recovery room. maw 

## 2015-06-26 NOTE — Progress Notes (Signed)
Called to room to assist during endoscopic procedure.  Patient ID and intended procedure confirmed with present staff. Received instructions for my participation in the procedure from the performing physician.  

## 2015-06-26 NOTE — Patient Instructions (Signed)
YOU HAD AN ENDOSCOPIC PROCEDURE TODAY AT [facility]:   Refer to the procedure report that was given to you for any specific questions about what was found during the examination.  If the procedure report does not answer your questions, please call your gastroenterologist to clarify.  If you requested that your care partner not be given the details of your procedure findings, then the procedure report has been included in a sealed envelope for you to review at your convenience later.  YOU SHOULD EXPECT: Some feelings of bloating in the abdomen. Passage of more gas than usual.  Walking can help get rid of the air that was put into your GI tract during the procedure and reduce the bloating. If you had a lower endoscopy (such as a colonoscopy or flexible sigmoidoscopy) you may notice spotting of blood in your stool or on the toilet paper. If you underwent a bowel prep for your procedure, you may not have a normal bowel movement for a few days.  Please Note:  You might notice some irritation and congestion in your nose or some drainage.  This is from the oxygen used during your procedure.  There is no need for concern and it should clear up in a day or so.  SYMPTOMS TO REPORT IMMEDIATELY:   Following lower endoscopy (colonoscopy or flexible sigmoidoscopy):  Excessive amounts of blood in the stool  Significant tenderness or worsening of abdominal pains  Swelling of the abdomen that is new, acute  Fever of 100F or higher   For urgent or emergent issues, a gastroenterologist can be reached at any hour by calling [phone].   DIET: Your first meal following the procedure should be a small meal and then it is ok to progress to your normal diet. Heavy or fried foods are harder to digest and may make you feel nauseous or bloated.  Likewise, meals heavy in dairy and vegetables can increase bloating.  Drink plenty of fluids but you should avoid alcoholic beverages for 24  hours.  ACTIVITY:  You should plan to take it easy for the rest of today and you should NOT DRIVE or use heavy machinery until tomorrow (because of the sedation medicines used during the test).    FOLLOW UP: Our staff will call the number listed on your records the next business day following your procedure to check on you and address any questions or concerns that you may have regarding the information given to you following your procedure. If we do not reach you, we will leave a message.  However, if you are feeling well and you are not experiencing any problems, there is no need to return our call.  We will assume that you have returned to your regular daily activities without incident.  If any biopsies were taken you will be contacted by phone or by letter within the next 1-3 weeks.  Please call us at [phone] if you have not heard about the biopsies in 3 weeks.    SIGNATURES/CONFIDENTIALITY: You and/or your care partner have signed paperwork which will be entered into your electronic medical record.  These signatures attest to the fact that that the information above on your After Visit Summary has been reviewed and is understood.  Full responsibility of the confidentiality of this discharge information lies with you and/or your care-partner.    Handouts were given to your care partner on polyps, diverticulosis, hemorrhoids, and a high fiber diet with liberal fluid intake. No aspirin,  aspirin products,  ibuprofen, naproxen, advil, motrin, aleve, or other non-steroidal anti-inflammatory drugs for 14 days after polyp removal. Tylenol is ok if you need pain medication. You may resume your other current medications today. Await biopsy results. Please call if any questions or concerns.

## 2015-06-26 NOTE — Op Note (Signed)
[facility] Patient Name: Randy Hendrix Procedure Date: 06/26/2015 8:27 AM MRN: [id number] Endoscopist: Remo Lipps P. Havery Moros , MD Age: 50 Date of Birth: 12-01-1965 Gender: Male Procedure:                Colonoscopy Indications:              Screening for malignant neoplasm in the colon, This                            is the patient's first colonoscopy Medicines:                Monitored Anesthesia Care Procedure:                Pre-Anesthesia Assessment:                           - Prior to the procedure, a History and Physical                            was performed, and patient medications and                            allergies were reviewed. The patient's tolerance of                            previous anesthesia was also reviewed. The risks                            and benefits of the procedure and the sedation                            options and risks were discussed with the patient.                            All questions were answered, and informed consent                            was obtained. Prior Anticoagulants: The patient has                            taken no previous anticoagulant or antiplatelet                            agents. ASA Grade Assessment: III - A patient with                            severe systemic disease. After reviewing the risks                            and benefits, the patient was deemed in                            satisfactory condition to undergo the procedure.  After obtaining informed consent, the colonoscope                            was passed under direct vision. Throughout the                            procedure, the patient's blood pressure, pulse, and                            oxygen saturations were monitored continuously. The                            Model CF-HQ190L [id number]) scope was introduced                            through the anus and advanced to the the cecum,                         identified by appendiceal orifice and ileocecal                            valve. The colonoscopy was performed without                            difficulty. The patient tolerated the procedure                            well. The quality of the bowel preparation was                            adequate. The ileocecal valve, appendiceal orifice,                            and rectum were photographed. Scope In: 8:39:50 AM Scope Out: 9:04:09 AM Scope Withdrawal Time: 0 hours 20 minutes 26 seconds  Total Procedure Duration: 0 hours 24 minutes 19 seconds  Findings:                 The perianal and digital rectal examinations were                            normal.                           A 4 mm polyp was found in the transverse colon. The                            polyp was sessile. The polyp was removed with a                            cold snare. Resection and retrieval were complete.                           A 4 mm polyp was found in the descending colon. The  polyp was sessile. The polyp was removed with a                            cold snare. Resection and retrieval were complete.                           Many medium-mouthed diverticula were found in the                            left colon.                           Non-bleeding internal hemorrhoids were found during                            retroflexion.                           The exam was otherwise without abnormality. Complications:            No immediate complications. Estimated blood loss:                            Minimal. Estimated Blood Loss:     Estimated blood loss was minimal. Impression:               - One 4 mm polyp in the transverse colon, removed                            with a cold snare. Resected and retrieved.                           - One 4 mm polyp in the descending colon, removed                            with a cold snare. Resected and  retrieved.                           - Diverticulosis in the left colon.                           - Non-bleeding internal hemorrhoids.                           - The examination was otherwise normal. Recommendation:           - Patient has a contact number available for                            emergencies. The signs and symptoms of potential                            delayed complications were discussed with the                            patient. Return to normal activities tomorrow.  Written discharge instructions were provided to the                            patient.                           - Resume previous diet.                           - Continue present medications.                           - No aspirin, ibuprofen, naproxen, or other                            non-steroidal anti-inflammatory drugs for 2 weeks                            after polyp removal.                           - Await pathology results.                           - Repeat colonoscopy is recommended for                            surveillance. The colonoscopy date will be                            determined after pathology results from today's                            exam become available for review. Remo Lipps P. Randy Sandell, MD 06/26/2015 9:07:28 AM This report has been signed electronically.

## 2015-06-27 ENCOUNTER — Telehealth: Payer: Self-pay | Admitting: *Deleted

## 2015-06-27 NOTE — Telephone Encounter (Signed)
  Follow up Call-  Call back number 06/26/2015  Post procedure Call Back phone  # [phone] #   Permission to leave phone message Yes     Patient questions:  Do you have a fever, pain , or abdominal swelling? No. Pain Score  0 *  Have you tolerated food without any problems? Yes.    Have you been able to return to your normal activities? Yes.    Do you have any questions about your discharge instructions: Diet   No. Medications  No. Follow up visit  No.  Do you have questions or concerns about your Care? No.  Actions: * If pain score is 4 or above: No action needed, pain <4.

## 2015-06-30 ENCOUNTER — Encounter: Payer: Self-pay | Admitting: Gastroenterology

## 2015-07-04 ENCOUNTER — Telehealth: Payer: Self-pay | Admitting: *Deleted

## 2015-07-04 NOTE — Telephone Encounter (Addendum)
Dr. Milinda Pointer reviewed fungal culture 06/06/2015 results as +, and ordered for pt to make an appt.  Left message for pt to make an appt to discuss results and treatment.

## 2015-08-01 ENCOUNTER — Encounter: Payer: Self-pay | Admitting: Podiatry

## 2015-08-15 ENCOUNTER — Telehealth: Payer: Self-pay

## 2015-08-15 MED ORDER — ALLOPURINOL 100 MG PO TABS: 100.0000 mg | ORAL_TABLET | Freq: Every day | ORAL | Status: DC

## 2015-08-15 NOTE — Telephone Encounter (Signed)
Medication refill sent to pharmacy  

## 2015-09-12 ENCOUNTER — Ambulatory Visit (INDEPENDENT_AMBULATORY_CARE_PROVIDER_SITE_OTHER): Payer: 59 | Admitting: Podiatry

## 2015-09-12 ENCOUNTER — Encounter: Payer: Self-pay | Admitting: Podiatry

## 2015-09-12 DIAGNOSIS — Z79899 Other long term (current) drug therapy: Secondary | ICD-10-CM | POA: Diagnosis not present

## 2015-09-12 DIAGNOSIS — L603 Nail dystrophy: Secondary | ICD-10-CM

## 2015-09-12 MED ORDER — TERBINAFINE HCL 250 MG PO TABS: 250.0000 mg | ORAL_TABLET | Freq: Every day | ORAL | 0 refills | Status: DC

## 2015-09-12 NOTE — Patient Instructions (Signed)

## 2015-09-12 NOTE — Progress Notes (Signed)
He presents today for follow-up of his fungal culture.  Objective: Vital signs are stable he is alert and oriented 3 no change in physical exam. His pathology report was positive for onychomycosis.  Assessment: Onychomycosis.  Plan: We discussed at length today the pros and cons of using oral medications. He understands the risks involved. Motor prescription for liver profile and CBC. I also wrote a prescription for Lamisil 250 mg tablets 1 by mouth daily #30. I will follow-up with him in 1 month for another liver profile. Should his lab work from abnormal we will notify him immediately.  Roselind Messier DPM

## 2015-09-14 LAB — HEPATIC FUNCTION PANEL
ALK PHOS: 69 U/L (ref 40–115)
ALT: 46 U/L (ref 9–46)
AST: 29 U/L (ref 10–35)
Albumin: 4.3 g/dL (ref 3.6–5.1)
BILIRUBIN DIRECT: 0.3 mg/dL — AB (ref <=0.2)
BILIRUBIN INDIRECT: 0.9 mg/dL (ref 0.2–1.2)
BILIRUBIN TOTAL: 1.2 mg/dL (ref 0.2–1.2)
Total Protein: 6.9 g/dL (ref 6.1–8.1)

## 2015-09-19 ENCOUNTER — Telehealth: Payer: Self-pay | Admitting: *Deleted

## 2015-09-19 NOTE — Telephone Encounter (Addendum)
-----   Message from Garrel Ridgel, [place] sent at 09/18/2015  7:59 PM EDT ----- Blood work looks ok.  May continue medication.09/19/2015-Left message informing pt of Dr. Stephenie Acres orders.

## 2015-09-25 ENCOUNTER — Ambulatory Visit: Payer: 59 | Admitting: Internal Medicine

## 2015-10-02 ENCOUNTER — Encounter: Payer: Self-pay | Admitting: Internal Medicine

## 2015-10-02 ENCOUNTER — Ambulatory Visit (INDEPENDENT_AMBULATORY_CARE_PROVIDER_SITE_OTHER): Payer: 59 | Admitting: Internal Medicine

## 2015-10-02 ENCOUNTER — Other Ambulatory Visit (INDEPENDENT_AMBULATORY_CARE_PROVIDER_SITE_OTHER): Payer: 59

## 2015-10-02 VITALS — BP 120/72 | HR 83 | Temp 98.4°F | Resp 18 | Ht 70.0 in | Wt 319.0 lb

## 2015-10-02 DIAGNOSIS — M1A9XX0 Chronic gout, unspecified, without tophus (tophi): Secondary | ICD-10-CM | POA: Diagnosis not present

## 2015-10-02 DIAGNOSIS — Z0000 Encounter for general adult medical examination without abnormal findings: Secondary | ICD-10-CM | POA: Diagnosis not present

## 2015-10-02 LAB — LIPID PANEL
CHOLESTEROL: 102 mg/dL (ref 0–200)
HDL: 31 mg/dL — ABNORMAL LOW (ref >39.00)
LDL Cholesterol: 58 mg/dL (ref 0–99)
NonHDL: 70.58
TRIGLYCERIDES: 62 mg/dL (ref 0.0–149.0)
Total CHOL/HDL Ratio: 3
VLDL: 12.4 mg/dL (ref 0.0–40.0)

## 2015-10-02 LAB — HEMOGLOBIN A1C: HEMOGLOBIN A1C: 5.8 % (ref 4.6–6.5)

## 2015-10-02 LAB — URIC ACID: URIC ACID, SERUM: 6.6 mg/dL (ref 4.0–7.8)

## 2015-10-02 NOTE — Assessment & Plan Note (Signed)
Colonoscopy due in 2022, reminded about flu vaccine yearly. Tetanus up to date. Not exercising and counseled on that. Non-smoker. Counseled on the dangers of distracted driving. Given screening recommendations.

## 2015-10-02 NOTE — Assessment & Plan Note (Signed)
Checking uric acid on allopurinol since he did not return for labs.

## 2015-10-02 NOTE — Progress Notes (Signed)
   Subjective:    Patient ID: Randy Hendrix, male    DOB: [birth year], 50 y.o.   MRN: [id number]  HPI The patient is a 50 YO man coming in for wellness and to switch providers. He denies any new problems and no more gout flares since February.   PMH, [facility], social history reviewed and updated.   Review of Systems  Constitutional: Positive for activity change. Negative for appetite change, fatigue, fever and unexpected weight change.       Not exercising  HENT: Negative.   Eyes: Negative.   Respiratory: Negative for cough, chest tightness and shortness of breath.   Cardiovascular: Negative for chest pain, palpitations and leg swelling.  Gastrointestinal: Negative for abdominal distention, abdominal pain, constipation, diarrhea and nausea.  Musculoskeletal: Negative for arthralgias, gait problem and joint swelling.  Skin: Negative for color change and wound.  Neurological: Negative.   Psychiatric/Behavioral: Negative.       Objective:   Physical Exam  Constitutional: He appears well-developed and well-nourished.  Obese  HENT:  Head: Normocephalic and atraumatic.  Eyes: EOM are normal.  Neck: Normal range of motion.  Cardiovascular: Normal rate and regular rhythm.   Pulmonary/Chest: Effort normal and breath sounds normal. No respiratory distress. He has no wheezes. He has no rales.  Abdominal: Soft. Bowel sounds are normal. He exhibits no distension. There is no tenderness. There is no rebound.  Musculoskeletal: He exhibits no edema.  Skin: Skin is warm and dry.  Psychiatric: He has a normal mood and affect.   Vitals:   10/02/15 0803  BP: 120/72  Pulse: 83  Resp: 18  Temp: 98.4 F (36.9 C)  TempSrc: Oral  SpO2: 96%  Weight: (!) 319 lb (144.7 kg)  Height: 5\' 10"  (1.778 m)      Assessment & Plan:

## 2015-10-02 NOTE — Assessment & Plan Note (Signed)
He is asked to make an effort to exercise more and watch his calories to help with his weight. Checking HgA1c as last sugar 110 and not checked in the past.

## 2015-10-02 NOTE — Patient Instructions (Signed)
Think about starting some exercise 3-4 days per week. This could be walking, biking, marching in place or any other activity that gets your heart rate up some. We recommend starting light and working your way up to 25-30 minutes of activity per day you exercise.   We will check the blood work today and call you back with the results.   Health Maintenance, Male A healthy lifestyle and preventative care can promote health and wellness.  Maintain regular health, dental, and eye exams.  Eat a healthy diet. Foods like vegetables, fruits, whole grains, low-fat dairy products, and lean protein foods contain the nutrients you need and are low in calories. Decrease your intake of foods high in solid fats, added sugars, and salt. Get information about a proper diet from your health care provider, if necessary.  Regular physical exercise is one of the most important things you can do for your health. Most adults should get at least 150 minutes of moderate-intensity exercise (any activity that increases your heart rate and causes you to sweat) each week. In addition, most adults need muscle-strengthening exercises on 2 or more days a week.   Maintain a healthy weight. The body mass index (BMI) is a screening tool to identify possible weight problems. It provides an estimate of body fat based on height and weight. Your health care provider can find your BMI and can help you achieve or maintain a healthy weight. For males 20 years and older:  A BMI below 18.5 is considered underweight.  A BMI of 18.5 to 24.9 is normal.  A BMI of 25 to 29.9 is considered overweight.  A BMI of 30 and above is considered obese.  Maintain normal blood lipids and cholesterol by exercising and minimizing your intake of saturated fat. Eat a balanced diet with plenty of fruits and vegetables. Blood tests for lipids and cholesterol should begin at age 66 and be repeated every 5 years. If your lipid or cholesterol levels are high,  you are over age 32, or you are at high risk for heart disease, you may need your cholesterol levels checked more frequently.Ongoing high lipid and cholesterol levels should be treated with medicines if diet and exercise are not working.  If you smoke, find out from your health care provider how to quit. If you do not use tobacco, do not start.  Lung cancer screening is recommended for adults aged 40-80 years who are at high risk for developing lung cancer because of a history of smoking. A yearly low-dose CT scan of the lungs is recommended for people who have at least a 30-pack-year history of smoking and are current smokers or have quit within the past 15 years. A pack year of smoking is smoking an average of 1 pack of cigarettes a day for 1 year (for example, a 30-pack-year history of smoking could mean smoking 1 pack a day for 30 years or 2 packs a day for 15 years). Yearly screening should continue until the smoker has stopped smoking for at least 15 years. Yearly screening should be stopped for people who develop a health problem that would prevent them from having lung cancer treatment.  If you choose to drink alcohol, do not have more than 2 drinks per day. One drink is considered to be 12 oz (360 mL) of beer, 5 oz (150 mL) of wine, or 1.5 oz (45 mL) of liquor.  Avoid the use of street drugs. Do not share needles with anyone. Ask for help  if you need support or instructions about stopping the use of drugs.  High blood pressure causes heart disease and increases the risk of stroke. High blood pressure is more likely to develop in:  People who have blood pressure in the end of the normal range (100-139/85-89 mm Hg).  People who are overweight or obese.  People who are African American.  If you are 47-39 years of age, have your blood pressure checked every 3-5 years. If you are 19 years of age or older, have your blood pressure checked every year. You should have your blood pressure measured  twice--once when you are at a hospital or clinic, and once when you are not at a hospital or clinic. Record the average of the two measurements. To check your blood pressure when you are not at a hospital or clinic, you can use:  An automated blood pressure machine at a pharmacy.  A home blood pressure monitor.  If you are 68-88 years old, ask your health care provider if you should take aspirin to prevent heart disease.  Diabetes screening involves taking a blood sample to check your fasting blood sugar level. This should be done once every 3 years after age 76 if you are at a normal weight and without risk factors for diabetes. Testing should be considered at a younger age or be carried out more frequently if you are overweight and have at least 1 risk factor for diabetes.  Colorectal cancer can be detected and often prevented. Most routine colorectal cancer screening begins at the age of 62 and continues through age 37. However, your health care provider may recommend screening at an earlier age if you have risk factors for colon cancer. On a yearly basis, your health care provider may provide home test kits to check for hidden blood in the stool. A small camera at the end of a tube may be used to directly examine the colon (sigmoidoscopy or colonoscopy) to detect the earliest forms of colorectal cancer. Talk to your health care provider about this at age 73 when routine screening begins. A direct exam of the colon should be repeated every 5-10 years through age 102, unless early forms of precancerous polyps or small growths are found.  People who are at an increased risk for hepatitis B should be screened for this virus. You are considered at high risk for hepatitis B if:  You were born in a country where hepatitis B occurs often. Talk with your health care provider about which countries are considered high risk.  Your parents were born in a high-risk country and you have not received a shot to  protect against hepatitis B (hepatitis B vaccine).  You have HIV or AIDS.  You use needles to inject street drugs.  You live with, or have sex with, someone who has hepatitis B.  You are a man who has sex with other men (MSM).  You get hemodialysis treatment.  You take certain medicines for conditions like cancer, organ transplantation, and autoimmune conditions.  Hepatitis C blood testing is recommended for all people born from [birth year] through 1966 and any individual with known risk factors for hepatitis C.  Healthy men should no longer receive prostate-specific antigen (PSA) blood tests as part of routine cancer screening. Talk to your health care provider about prostate cancer screening.  Testicular cancer screening is not recommended for adolescents or adult males who have no symptoms. Screening includes self-exam, a health care provider exam, and other screening tests.  Consult with your health care provider about any symptoms you have or any concerns you have about testicular cancer.  Practice safe sex. Use condoms and avoid high-risk sexual practices to reduce the spread of sexually transmitted infections (STIs).  You should be screened for STIs, including gonorrhea and chlamydia if:  You are sexually active and are younger than 24 years.  You are older than 24 years, and your health care provider tells you that you are at risk for this type of infection.  Your sexual activity has changed since you were last screened, and you are at an increased risk for chlamydia or gonorrhea. Ask your health care provider if you are at risk.  If you are at risk of being infected with HIV, it is recommended that you take a prescription medicine daily to prevent HIV infection. This is called pre-exposure prophylaxis (PrEP). You are considered at risk if:  You are a man who has sex with other men (MSM).  You are a heterosexual man who is sexually active with multiple partners.  You take drugs by  injection.  You are sexually active with a partner who has HIV.  Talk with your health care provider about whether you are at high risk of being infected with HIV. If you choose to begin PrEP, you should first be tested for HIV. You should then be tested every 3 months for as long as you are taking PrEP.  Use sunscreen. Apply sunscreen liberally and repeatedly throughout the day. You should seek shade when your shadow is shorter than you. Protect yourself by wearing long sleeves, pants, a wide-brimmed hat, and sunglasses year round whenever you are outdoors.  Tell your health care provider of new moles or changes in moles, especially if there is a change in shape or color. Also, tell your health care provider if a mole is larger than the size of a pencil eraser.  A one-time screening for abdominal aortic aneurysm (AAA) and surgical repair of large AAAs by ultrasound is recommended for men aged 41-75 years who are current or former smokers.  Stay current with your vaccines (immunizations).   This information is not intended to replace advice given to you by your health care provider. Make sure you discuss any questions you have with your health care provider.   Document Released: 08/03/2007 Document Revised: 02/25/2014 Document Reviewed: 07/02/2010 Elsevier Interactive Patient Education [employer].

## 2015-10-02 NOTE — Progress Notes (Signed)
Pre visit review using our clinic review tool, if applicable. No additional management support is needed unless otherwise documented below in the visit note. 

## 2015-10-10 ENCOUNTER — Ambulatory Visit: Payer: 59 | Admitting: Podiatry

## 2015-10-12 ENCOUNTER — Encounter: Payer: Self-pay | Admitting: Podiatry

## 2015-10-12 ENCOUNTER — Ambulatory Visit (INDEPENDENT_AMBULATORY_CARE_PROVIDER_SITE_OTHER): Payer: 59 | Admitting: Podiatry

## 2015-10-12 DIAGNOSIS — L603 Nail dystrophy: Secondary | ICD-10-CM | POA: Diagnosis not present

## 2015-10-12 DIAGNOSIS — Z79899 Other long term (current) drug therapy: Secondary | ICD-10-CM | POA: Diagnosis not present

## 2015-10-12 LAB — HEPATIC FUNCTION PANEL
ALBUMIN: 4 g/dL (ref 3.6–5.1)
ALK PHOS: 68 U/L (ref 40–115)
ALT: 47 U/L — AB (ref 9–46)
AST: 30 U/L (ref 10–35)
BILIRUBIN INDIRECT: 0.5 mg/dL (ref 0.2–1.2)
Bilirubin, Direct: 0.2 mg/dL (ref <=0.2)
TOTAL PROTEIN: 6.6 g/dL (ref 6.1–8.1)
Total Bilirubin: 0.7 mg/dL (ref 0.2–1.2)

## 2015-10-12 MED ORDER — TERBINAFINE HCL 250 MG PO TABS: 250.0000 mg | ORAL_TABLET | Freq: Every day | ORAL | 0 refills | Status: DC

## 2015-10-12 NOTE — Progress Notes (Signed)
He presents today 1 month after starting Lamisil therapy. He states that he has seen no changes as of yet. He denies any complications or side effects rashes or itching associated with Lamisil therapy.  Objective: No change in physical exam.  Assessment: Onychomycosis with long-term therapy.  Plan: Request another liver profile and CBC. Prescribed another 90 days of Lamisil. I will follow up with him in or months. Should his blood work back abnormal he will be notified immediately.

## 2015-10-20 ENCOUNTER — Telehealth: Payer: Self-pay | Admitting: *Deleted

## 2015-10-20 NOTE — Telephone Encounter (Addendum)
-----   Message from Garrel Ridgel, [place] sent at 10/16/2015  8:00 AM EDT ----- Blood work looks ok. 10/20/2015-Left message informing pt of Dr. Stephenie Acres review of labs.

## 2015-12-30 ENCOUNTER — Other Ambulatory Visit: Payer: Self-pay | Admitting: Internal Medicine

## 2016-01-04 ENCOUNTER — Ambulatory Visit (INDEPENDENT_AMBULATORY_CARE_PROVIDER_SITE_OTHER): Payer: 59 | Admitting: Internal Medicine

## 2016-01-04 ENCOUNTER — Encounter: Payer: Self-pay | Admitting: Internal Medicine

## 2016-01-04 VITALS — BP 128/72 | HR 75 | Ht 69.0 in | Wt 332.0 lb

## 2016-01-04 DIAGNOSIS — G4726 Circadian rhythm sleep disorder, shift work type: Secondary | ICD-10-CM | POA: Diagnosis not present

## 2016-01-04 DIAGNOSIS — G4733 Obstructive sleep apnea (adult) (pediatric): Secondary | ICD-10-CM | POA: Diagnosis not present

## 2016-01-04 NOTE — Progress Notes (Signed)
HPI  M never smoker with morbid obesity previously followed by Dr Gwenette Greet for osa last seen in 2013    01/03/2015 1st [facility] office visit/ [facility]   Chief Complaint  Patient presents with  . Pulmonary Consult    Former [facility] pt. Pt has OSA and wears CPAP nightly. Pt works 3rd shift and denies any increased somnolence. Pt denies cough/wheeze/SOB/CP.   Sleep Questionnaire: What time do you typically go to bed?( Between what hours) 730-8am How long does it take you to fall asleep? 5 mins How many times during the night do you wake up?  None  What time do you get out of bed to start your day? 2pm Do you drive or operate heavy machinery in your occupation? Sometimes  How much has your weight changed (up or down) over the past two years? (In pounds) 10 lb  Have you ever had a sleep study before? Yes If yes, location of study? [facility] If yes, date of study?  5/200  Do you currently use CPAP? Yes If so, what pressure? ? Don't know  you wear oxygen at any time? No  Epworth score 4    Wife satisfied he's no longer snoring or having apnea/ feels great and using cpap consistently but has gained wt since last study  01/04/2016-50 year old male never smoker followed for OSA, third shift worker, complicated by GERD, allergic rhinitis, obesity CPAP 15/[facility]-download December 2016 showed excellent compliance and control. FOLLOWS FOR:DME: [employer]; Pt states wears CPAP daily for about 6 hours; pressure works well for him and new supplies needed. Will also need to order DL as patient is not in AV and did not bring SD Card. Patient reports good compliance and comfortable control with CPAP at 15. Not told that he snores through it. Feels well rested. If he forgets, family reminds him to wear it.  ROS-see HPI   Negative unless "+" Constitutional:    weight loss, night sweats, fevers, chills, fatigue, lassitude. HEENT:    headaches, difficulty swallowing, tooth/dental problems, sore throat,        sneezing, itching, ear ache, nasal congestion, post nasal drip, snoring CV:    chest pain, orthopnea, PND, swelling in lower extremities, anasarca,                                                      dizziness, palpitations Resp:   shortness of breath with exertion or at rest.                productive cough,   non-productive cough, coughing up of blood.              change in color of mucus.  wheezing.   Skin:    rash or lesions. GI:  No-   heartburn, indigestion, abdominal pain, nausea, vomiting, diarrhea,                 change in bowel habits, loss of appetite GU: dysuria, change in color of urine, no urgency or frequency.   flank pain. MS:   joint pain, stiffness, decreased range of motion, back pain. Neuro-     nothing unusual Psych:  change in mood or affect.  depression or anxiety.   memory loss.  OBJ- Physical Exam General- Alert, Oriented, Affect-appropriate, Distress- none acute + obese Skin- rash-none,  lesions- none, excoriation- none Lymphadenopathy- none Head- atraumatic            Eyes- Gross vision intact, PERRLA, conjunctivae and secretions clear            Ears- Hearing, canals-normal            Nose- Clear, no-Septal dev, mucus, polyps, erosion, perforation             Throat- Mallampati IV , mucosa clear , drainage- none, tonsils- atrophic Neck- flexible , trachea midline, no stridor , thyroid nl, carotid no bruit Chest - symmetrical excursion , unlabored           Heart/CV- RRR , no murmur , no gallop  , no rub, nl s1 s2                           - JVD- none , edema- none, stasis changes- none, varices- none           Lung- clear to P&A, wheeze- none, cough- none , dullness-none, rub- none           Chest wall-  Abd-  Br/ Gen/ Rectal- Not done, not indicated Extrem- cyanosis- none, clubbing, none, atrophy- none, strength- nl Neuro- grossly intact to observation

## 2016-01-04 NOTE — Patient Instructions (Signed)
Order- DME [facility]  Continue CPAP 15, mask of choice, humidifier, supplies, AirView   Dx OSA                        Patient would like new mask and supplies now please  Please call if we can help

## 2016-01-05 DIAGNOSIS — G4726 Circadian rhythm sleep disorder, shift work type: Secondary | ICD-10-CM | POA: Insufficient documentation

## 2016-01-05 NOTE — Assessment & Plan Note (Signed)
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 We discussed basics of sleep hygiene and dark quiet bedroom.

## 2016-01-05 NOTE — Assessment & Plan Note (Signed)
Weight loss would help we need download for confirmation but by his report compliance and control are quite good and he is very pleased to continue CPAP because it has improved his quality of life.

## 2016-01-31 ENCOUNTER — Other Ambulatory Visit: Payer: Self-pay | Admitting: Podiatry

## 2016-02-08 ENCOUNTER — Ambulatory Visit: Payer: 59 | Admitting: Podiatry

## 2016-02-13 ENCOUNTER — Ambulatory Visit (HOSPITAL_COMMUNITY)
Admission: EM | Admit: 2016-02-13 | Discharge: 2016-02-13 | Disposition: A | Discharge status: Home / Self Care | Payer: 59 | Attending: Emergency Medicine | Admitting: Emergency Medicine

## 2016-02-13 ENCOUNTER — Encounter (HOSPITAL_COMMUNITY): Payer: Self-pay | Admitting: Emergency Medicine

## 2016-02-13 DIAGNOSIS — A09 Infectious gastroenteritis and colitis, unspecified: Secondary | ICD-10-CM | POA: Diagnosis not present

## 2016-02-13 MED ORDER — ONDANSETRON HCL 4 MG PO TABS: 4.0000 mg | ORAL_TABLET | Freq: Three times a day (TID) | ORAL | 0 refills | Status: DC | PRN

## 2016-02-13 MED ORDER — CIPROFLOXACIN HCL 500 MG PO TABS: 500.0000 mg | ORAL_TABLET | Freq: Two times a day (BID) | ORAL | 0 refills | Status: DC

## 2016-02-13 NOTE — ED Triage Notes (Signed)
The patient presented to the [facility] with a complaint of abdominal cramping with N/V/D that started yesterday. The patient stated that he believed it to be related to eating some bad [place].

## 2016-02-13 NOTE — ED Provider Notes (Signed)
[facility]    CSN: [id number] Arrival date & time: 02/13/16  1520     History   Chief Complaint Chief Complaint  Patient presents with  . Abdominal Cramping    HPI Randy Hendrix is a 51 y.o. male.   HPI He is a 50 year old man here with his wife for evaluation of vomiting, abdominal cramping, and diarrhea. Symptoms started yesterday morning. They had [place] for Christmas. They obtained some smokes her keys from a friend. The original turkeys they received smell funny and the friend later called him to stay to throw them out as he did not cut through. He then provided new troches. Unfortunately, the patient sliced all the [place] and ate some of it. He then went on to develop nausea, vomiting, abdominal cramping, and profuse diarrhea. His wife reports 15 episodes of diarrhea yesterday. He reports chills, but denies fevers. His wife states several of her family members are sick with similar symptoms after eating a [place]. He has not been eating or drinking much. His wife did make him drink some Gatorade yesterday and today.  Past Medical History:  Diagnosis Date  . Allergic rhinitis   . ALLERGIC RHINITIS   . Allergy   . Gastroenteritis   . GERD (gastroesophageal reflux disease)    occasionally  . Hemorrhoid   . Kidney stone on right side 05/2004  . OBESITY, CLASS II   . OSA (obstructive sleep apnea)    CPAP qhs  . Oxygen deficiency   . PATELLAR DISLOCATION, RIGHT   . Sleep apnea    wears cpap    Patient Active Problem List   Diagnosis Date Noted  . 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 Gout 12/15/2014  . Routine health maintenance 07/28/2011  . Severe obesity (BMI >= 40) ([place]) 06/21/2008  . ALLERGIC RHINITIS 04/28/2007  . OSA (obstructive sleep apnea) 04/28/2007  . Personal history of other diseases of digestive system 04/28/2007    Past Surgical History:  Procedure Laterality Date  . HEMORRHOID SURGERY    . WISDOM TOOTH EXTRACTION          Home Medications    Prior to Admission medications   Medication Sig Start Date End Date Taking? Authorizing Provider  allopurinol (ZYLOPRIM) 100 MG tablet Take 1 tablet (100 mg total) by mouth daily. 08/15/15  Yes Biagio Borg, MD  Cyanocobalamin (VITAMIN B 12 PO) Take 1 tablet by mouth daily.   Yes Historical Provider, MD  Garcinia Cambogia-Chromium 500-200 MG-MCG TABS Take 1 capsule by mouth daily.   Yes Historical Provider, MD  Nyoka Cowden Tea, Camillia sinensis, (GREEN TEA PO) Take 1 capsule by mouth daily.   Yes Historical Provider, MD  Multiple Vitamin (MULTIVITAMIN) capsule Take 1 capsule by mouth daily.   Yes Historical Provider, MD  ciprofloxacin (CIPRO) 500 MG tablet Take 1 tablet (500 mg total) by mouth 2 (two) times daily. 02/13/16   Melony Overly, MD  ondansetron (ZOFRAN) 4 MG tablet Take 1 tablet (4 mg total) by mouth every 8 (eight) hours as needed for nausea or vomiting. 02/13/16   Melony Overly, MD    Family History Family History  Problem Relation Age of Onset  . Diabetes Mother   . Anemia Mother   . Immunodeficiency Mother   . Stroke Father   . Dementia Father   . Hypertension Father   . Stomach cancer Maternal Grandmother   . Colon cancer Neg Hx   . Prostate cancer Neg Hx   .  Cancer Neg Hx   . Heart disease Neg Hx   . COPD Neg Hx   . Colon polyps Neg Hx   . Esophageal cancer Neg Hx   . Rectal cancer Neg Hx     Social History Social History  Substance Use Topics  . Smoking status: Never Smoker  . Smokeless tobacco: Never Used  . Alcohol use No     Allergies   Patient has no known allergies.   Review of Systems Review of Systems As in history of present illness  Physical Exam Triage Vital Signs ED Triage Vitals  Enc Vitals Group     BP 02/13/16 1616 146/78     Pulse Rate 02/13/16 1616 77     Resp 02/13/16 1616 20     Temp 02/13/16 1616 98.6 F (37 C)     Temp Source 02/13/16 1616 Oral     SpO2 02/13/16 1616 98 %     Weight --       Height --      Head Circumference --      Peak Flow --      Pain Score 02/13/16 1619 4     Pain Loc --      Pain Edu? --      Excl. in [place]? --    No data found.   Updated Vital Signs BP 146/78 (BP Location: Right Wrist)   Pulse 77   Temp 98.6 F (37 C) (Oral)   Resp 20   SpO2 98%   Visual Acuity Right Eye Distance:   Left Eye Distance:   Bilateral Distance:    Right Eye Near:   Left Eye Near:    Bilateral Near:     Physical Exam  Constitutional: He is oriented to person, place, and time. He appears well-developed and well-nourished. No distress.  HENT:  Mouth/Throat: Oropharynx is clear and moist.  Cardiovascular: Normal rate, regular rhythm and normal heart sounds.   No murmur heard. Pulmonary/Chest: Effort normal and breath sounds normal. No respiratory distress. He has no wheezes. He has no rales.  Abdominal: Soft. Bowel sounds are normal. He exhibits no distension. There is no tenderness. There is no rebound and no guarding.  Neurological: He is alert and oriented to person, place, and time.     UC Treatments / Results  Labs (all labs ordered are listed, but only abnormal results are displayed) Labs Reviewed - No data to display  EKG  EKG Interpretation None       Radiology No results found.  Procedures Procedures (including critical care time)  Medications Ordered in UC Medications - No data to display   Initial Impression / Assessment and Plan / UC Course  I have reviewed the triage vital signs and the nursing notes.  Pertinent labs & imaging results that were available during my care of the patient were reviewed by me and considered in my medical decision making (see chart for details).  Clinical Course     Salmonella is certainly a possibility. Given the number of stools he has had in the last 24 hours and his age, will cover with Cipro for 5 days. Zofran as needed for nausea. No antidiarrheals for the next 48 hours. Work note provided.  Return precautions reviewed.  Final Clinical Impressions(s) / UC Diagnoses   Final diagnoses:  Diarrhea of infectious origin    New Prescriptions New Prescriptions   CIPROFLOXACIN (CIPRO) 500 MG TABLET    Take 1 tablet (500 mg total) by  mouth 2 (two) times daily.   ONDANSETRON (ZOFRAN) 4 MG TABLET    Take 1 tablet (4 mg total) by mouth every 8 (eight) hours as needed for nausea or vomiting.     Melony Overly, MD 02/13/16 [phone]

## 2016-02-13 NOTE — Discharge Instructions (Signed)
This is food poisoning. Salmonella is a possibility. Since you are over 50 and you are having greater than 10 stools a day, take Cipro twice a day for 5 days. Use the Zofran every 8 hours as needed for nausea and vomiting. Make sure you are drinking lots of fluids. Do not use any antidiarrheal medication for the next 48 hours. Follow-up as needed.

## 2016-02-22 ENCOUNTER — Ambulatory Visit (INDEPENDENT_AMBULATORY_CARE_PROVIDER_SITE_OTHER): Payer: 59 | Admitting: Podiatry

## 2016-02-22 ENCOUNTER — Encounter: Payer: Self-pay | Admitting: Podiatry

## 2016-02-22 DIAGNOSIS — L603 Nail dystrophy: Secondary | ICD-10-CM

## 2016-02-25 NOTE — Progress Notes (Signed)
He presents today for follow-up of his onychomycosis. He's been taking Lamisil for 120 days days and states that they seem to be looking better.  Objective: Approximately 50% grow out. Skin appears to be perfectly normal.  Assessment: Onychomycosis resolving.  Plan: Prescribed Lamisil 250 mg tablets 1 by mouth  every other day. Follow up with him in 3 months dispensed 30 tablets.

## 2016-02-26 ENCOUNTER — Other Ambulatory Visit: Payer: Self-pay | Admitting: Podiatry

## 2016-02-28 ENCOUNTER — Telehealth: Payer: Self-pay | Admitting: *Deleted

## 2016-02-28 MED ORDER — TERBINAFINE HCL 250 MG PO TABS: ORAL_TABLET | ORAL | 0 refills | Status: DC

## 2016-02-28 NOTE — Telephone Encounter (Signed)
[facility] electronically requested Lamisil with incorrect orders. Dr. Milinda Pointer 02/22/2016 ordered lamisil 250mg  #30 one tablet qod. Correct orders sent to [facility] [id number].

## 2016-03-09 ENCOUNTER — Other Ambulatory Visit: Payer: Self-pay | Admitting: Internal Medicine

## 2016-05-23 ENCOUNTER — Ambulatory Visit: Payer: 59 | Admitting: Podiatry

## 2016-05-30 ENCOUNTER — Encounter: Payer: Self-pay | Admitting: Podiatry

## 2016-05-30 ENCOUNTER — Ambulatory Visit (INDEPENDENT_AMBULATORY_CARE_PROVIDER_SITE_OTHER): Payer: 59 | Admitting: Podiatry

## 2016-05-30 DIAGNOSIS — Z79899 Other long term (current) drug therapy: Secondary | ICD-10-CM | POA: Diagnosis not present

## 2016-05-30 DIAGNOSIS — L603 Nail dystrophy: Secondary | ICD-10-CM | POA: Diagnosis not present

## 2016-05-30 MED ORDER — TERBINAFINE HCL 250 MG PO TABS: ORAL_TABLET | ORAL | 0 refills | Status: DC

## 2016-06-02 NOTE — Progress Notes (Signed)
He presents today for follow-up of his onychomycosis. States it seems to be almost grown out.  Objective: He is a problem only 80% to 90% grown out.  Assessment onychomycosis slowly healing with long-term therapy.  Plan: I encouraged him to do another 60 days of Lamisil every other day. Follow-up with me in 3-4 months if necessary.

## 2016-06-18 DIAGNOSIS — H2513 Age-related nuclear cataract, bilateral: Secondary | ICD-10-CM | POA: Diagnosis not present

## 2016-06-18 DIAGNOSIS — H40023 Open angle with borderline findings, high risk, bilateral: Secondary | ICD-10-CM | POA: Diagnosis not present

## 2016-06-18 DIAGNOSIS — H40053 Ocular hypertension, bilateral: Secondary | ICD-10-CM | POA: Diagnosis not present

## 2016-08-02 ENCOUNTER — Encounter: Payer: Self-pay | Admitting: Physician Assistant

## 2016-08-02 ENCOUNTER — Other Ambulatory Visit (INDEPENDENT_AMBULATORY_CARE_PROVIDER_SITE_OTHER): Payer: 59

## 2016-08-02 ENCOUNTER — Ambulatory Visit (INDEPENDENT_AMBULATORY_CARE_PROVIDER_SITE_OTHER): Payer: 59 | Admitting: Physician Assistant

## 2016-08-02 VITALS — BP 106/70 | HR 68 | Ht 68.11 in | Wt 320.0 lb

## 2016-08-02 DIAGNOSIS — D500 Iron deficiency anemia secondary to blood loss (chronic): Secondary | ICD-10-CM

## 2016-08-02 DIAGNOSIS — R109 Unspecified abdominal pain: Secondary | ICD-10-CM

## 2016-08-02 DIAGNOSIS — Z86010 Personal history of colonic polyps: Secondary | ICD-10-CM | POA: Diagnosis not present

## 2016-08-02 DIAGNOSIS — K5730 Diverticulosis of large intestine without perforation or abscess without bleeding: Secondary | ICD-10-CM | POA: Diagnosis not present

## 2016-08-02 DIAGNOSIS — R197 Diarrhea, unspecified: Secondary | ICD-10-CM | POA: Diagnosis not present

## 2016-08-02 LAB — IBC PANEL
Iron: 101 ug/dL (ref 42–165)
Saturation Ratios: 39.2 % (ref 20.0–50.0)
Transferrin: 184 mg/dL — ABNORMAL LOW (ref 212.0–360.0)

## 2016-08-02 LAB — CBC WITH DIFFERENTIAL/PLATELET
BASOS ABS: 0.1 10*3/uL (ref 0.0–0.1)
Basophils Relative: 0.5 % (ref 0.0–3.0)
EOS ABS: 0.2 10*3/uL (ref 0.0–0.7)
Eosinophils Relative: 1.5 % (ref 0.0–5.0)
HCT: 36.2 % — ABNORMAL LOW (ref 39.0–52.0)
Hemoglobin: 11.3 g/dL — ABNORMAL LOW (ref 13.0–17.0)
LYMPHS ABS: 2 10*3/uL (ref 0.7–4.0)
Lymphocytes Relative: 18.4 % (ref 12.0–46.0)
MCHC: 31.2 g/dL (ref 30.0–36.0)
MCV: 68.4 fl — ABNORMAL LOW (ref 78.0–100.0)
MONOS PCT: 5.8 % (ref 3.0–12.0)
Monocytes Absolute: 0.6 10*3/uL (ref 0.1–1.0)
NEUTROS ABS: 8.2 10*3/uL — AB (ref 1.4–7.7)
NEUTROS PCT: 73.8 % (ref 43.0–77.0)
PLATELETS: 218 10*3/uL (ref 150.0–400.0)
RBC: 5.28 Mil/uL (ref 4.22–5.81)
RDW: 18.2 % — ABNORMAL HIGH (ref 11.5–15.5)
WBC: 11.1 10*3/uL — ABNORMAL HIGH (ref 4.0–10.5)

## 2016-08-02 LAB — IRON: Iron: 101 ug/dL (ref 42–165)

## 2016-08-02 LAB — HIGH SENSITIVITY CRP: CRP, High Sensitivity: 6.35 mg/L — ABNORMAL HIGH (ref 0.000–5.000)

## 2016-08-02 LAB — SEDIMENTATION RATE: Sed Rate: 24 mm/hr — ABNORMAL HIGH (ref 0–20)

## 2016-08-02 LAB — FERRITIN: Ferritin: 222.9 ng/mL (ref 22.0–322.0)

## 2016-08-02 MED ORDER — SACCHAROMYCES BOULARDII 250 MG PO CAPS: 250.0000 mg | ORAL_CAPSULE | Freq: Two times a day (BID) | ORAL | 1 refills | Status: DC

## 2016-08-02 MED ORDER — DICYCLOMINE HCL 10 MG PO CAPS: ORAL_CAPSULE | ORAL | 0 refills | Status: DC

## 2016-08-02 NOTE — Progress Notes (Signed)
Agree with assessment and plan as outlined.  

## 2016-08-02 NOTE — Progress Notes (Signed)
Subjective:    Patient ID: Randy Hendrix, male    DOB: February 26, 1965, 51 y.o.   MRN: [id number]  HPI Randy Hendrix is a pleasant 51 year old African-American male known to Dr. Havery Moros from previous colonoscopy done in May 2017 for screening. He was found to have 2 small polyps both about 4 mm in size, multiple diverticuli and internal hemorrhoids. Path on the polyps 1 was tubular adenoma and the other benign polypoid mucosa. He was advised to have 5 year interval follow-up. Patient comes in today with complaints of 3-4 month history of intermittent episodes of abdominal cramping and diarrhea followed by episodes of constipation and/or normal bowel movements. He says he may not have an episode for couple of weeks but when he does the symptoms last for a day or 2 and he may have multiple bowel movements associated with abdominal cramping. He has not noticed any blood. He feels fine in between these episodes other than having irregular bowel movements. No fever or chills. Appetite has been good, weight has been stable. He says his symptoms started shortly after he had a bad episode of food poisoning in December 2017. Is not aware of any specific triggers for his current episodes of diarrhea no known food intolerances. No new medications or supplements. He believes he did take an antibiotic at some point this  spring.  Review of Systems Pertinent positive and negative review of systems were noted in the above HPI section.  All other review of systems was otherwise negative.  Outpatient Encounter Prescriptions as of 08/02/2016  Medication Sig  . allopurinol (ZYLOPRIM) 100 MG tablet take 1 tablet by mouth once daily  . Cyanocobalamin (VITAMIN B 12 PO) Take 1 tablet by mouth daily.  . fexofenadine (ALLEGRA) 180 MG tablet Take 180 mg by mouth daily.  . Garcinia Cambogia-Chromium 500-200 MG-MCG TABS Take 1 capsule by mouth daily.  Nyoka Cowden Tea, Camillia sinensis, (GREEN TEA PO) Take 1 capsule by mouth  daily.  . mometasone (NASONEX) 50 MCG/ACT nasal spray Place 2 sprays into the nose daily.  . Multiple Vitamin (MULTIVITAMIN) capsule Take 1 capsule by mouth daily.  Marland Kitchen terbinafine (LAMISIL) 250 MG tablet Take one tablet every other day.  . dicyclomine (BENTYL) 10 MG capsule Take 1 tablet by mouth 2-3 times daily as needed for cramping and diarrhea.  . saccharomyces boulardii (FLORASTOR) 250 MG capsule Take 1 capsule (250 mg total) by mouth 2 (two) times daily.   No facility-administered encounter medications on file as of 08/02/2016.    No Known Allergies Patient Active Problem List   Diagnosis Date Noted  . 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 Gout 12/15/2014  . Routine health maintenance 07/28/2011  . Severe obesity (BMI >= 40) ([place]) 06/21/2008  . ALLERGIC RHINITIS 04/28/2007  . OSA (obstructive sleep apnea) 04/28/2007  . Personal history of other diseases of digestive system 04/28/2007   Social History   Social History  . Marital status: Married    Spouse name: Annetta  . Number of children: 2  . Years of education: 12   Occupational History  . [employer] in maintenance   .  [employer]   Social History Main Topics  . Smoking status: Never Smoker  . Smokeless tobacco: Never Used  . Alcohol use No  . Drug use: No  . Sexual activity: Yes    Partners: Female   Other Topics Concern  . Not on file   Social History Narrative   HSG. 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 Married 1990. 1 daughter 2002 and 1 son 72. SO-multiple medical problems- but currently stable. Daughter w/ petit mal seizures. Marriage is in good health.      [name] family history includes Anemia in his mother; Dementia in his father; Diabetes in his mother; Hypertension in his father; Immunodeficiency in his mother; Stomach cancer in his maternal grandmother; Stroke in his father.      Objective:    Vitals:   08/02/16 0834  BP: 106/70  Pulse: 68     Physical Exam well-developed African-American male in no acute distress, pleasant blood pressure 106/70 pulse 68, Height 5 foot 8, weight 320, BMI 48.5. HEENT; nontraumatic normocephalic EOMI PERRLA sclera anicteric, Cardiovascular; regular rate and rhythm with S1-S2 no murmur or gallop, Pulmonary; clear bilaterally, Abdomen ;obese soft nontender nondistended bowel sounds are active there is no palpable mass or hepatosplenomegaly, Rectal ;exam not done, Ext; no clubbing cyanosis or edema skin warm and dry, Neuropsych; mood and affect appropriate       Assessment & Plan:   #38 51 year old African-American male with 4 month history of intermittent episodes of diarrhea and abdominal cramping followed by constipation or normal bowel movements. Also complains of persistent gas. Symptoms started shortly after he had had an episode of acute infectious gastroenteritis. 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 C. difficile or Giardia though doubt  #2 history of adenomatous colon polyp, last colonoscopy May 2017-will be due for interval follow-up 2022 #3 diverticulosis Number for internal hemorrhoids #5 morbid obesity #6 obstructive sleep apnea  Plan; CBC with differential, sedimentation rate, CRP Stool for lactoferrin GI pathogen panel Start Florastor  one by mouth twice a day 6 weeks Start Bentyl 10 mg by mouth twice a day to 3 times a day on a when necessary basis for episodes of cramping and diarrhea If labs and stool studies are negative will offer him a course of Xifaxan 550 mg by mouth 3 times a day 14 days.  Amy S Esterwood PA-C 08/02/2016   Cc: Hoyt Koch, *

## 2016-08-02 NOTE — Patient Instructions (Signed)
Please go to the basement level to have your labs drawn and stool studies.  We have sent the following medications to your pharmacy for you to pick up at your convenience: 1. Florastor 2. Bentyl 10 mg.

## 2016-08-07 ENCOUNTER — Other Ambulatory Visit: Payer: Self-pay

## 2016-08-07 DIAGNOSIS — D508 Other iron deficiency anemias: Secondary | ICD-10-CM

## 2016-08-09 ENCOUNTER — Other Ambulatory Visit: Payer: 59

## 2016-08-09 DIAGNOSIS — R109 Unspecified abdominal pain: Secondary | ICD-10-CM

## 2016-08-09 DIAGNOSIS — R197 Diarrhea, unspecified: Secondary | ICD-10-CM

## 2016-08-12 LAB — GASTROINTESTINAL PATHOGEN PANEL PCR
C. DIFFICILE TOX A/B, PCR: NOT DETECTED
CAMPYLOBACTER, PCR: NOT DETECTED
CRYPTOSPORIDIUM, PCR: NOT DETECTED
E COLI (STEC) STX1/STX2, PCR: NOT DETECTED
E COLI 0157, PCR: NOT DETECTED
E coli (ETEC) LT/ST PCR: NOT DETECTED
Giardia lamblia, PCR: NOT DETECTED
Norovirus, PCR: NOT DETECTED
ROTAVIRUS, PCR: NOT DETECTED
SALMONELLA, PCR: NOT DETECTED
Shigella, PCR: NOT DETECTED

## 2016-08-12 LAB — FECAL LACTOFERRIN, QUANT: LACTOFERRIN: NEGATIVE

## 2016-08-14 ENCOUNTER — Other Ambulatory Visit: Payer: 59

## 2016-08-14 DIAGNOSIS — D508 Other iron deficiency anemias: Secondary | ICD-10-CM

## 2016-08-15 LAB — HEMOGLOBINOPATHY EVALUATION
HEMOGLOBIN A2 QUANTITATION: 4.4 % — AB (ref 1.8–3.2)
HEMOGLOBIN F QUANTITATION: 4.5 % — AB (ref 0.0–2.0)
HGB A: 91.1 % — AB (ref 96.4–98.8)
HGB C: 0 %
HGB S: 0 %
HGB VARIANT: 0 %

## 2016-08-19 ENCOUNTER — Telehealth: Payer: Self-pay | Admitting: Gastroenterology

## 2016-08-19 NOTE — Telephone Encounter (Signed)
See the lab results note please

## 2016-08-19 NOTE — Telephone Encounter (Signed)
Routed to Beth. 

## 2016-08-28 ENCOUNTER — Telehealth: Payer: Self-pay | Admitting: Physician Assistant

## 2016-08-28 NOTE — Telephone Encounter (Signed)
Called back but got her voicemail. The next step would be Xifaxan per Dr Doyne Keel note. If the patient is interested in trying that, please confirm his pharmacy. Dr Havery Moros please advise on Xifaxan dosage and SIG. Thanks

## 2016-08-28 NOTE — Telephone Encounter (Signed)
Thanks. Yes we can order Rifaximin 550mg  TID for 2 weeks (#42, RF0) if symptoms persist. thanks

## 2016-08-29 ENCOUNTER — Other Ambulatory Visit: Payer: Self-pay

## 2016-08-29 MED ORDER — RIFAXIMIN 550 MG PO TABS: 550.0000 mg | ORAL_TABLET | Freq: Three times a day (TID) | ORAL | 0 refills | Status: DC

## 2016-08-29 NOTE — Telephone Encounter (Signed)
Pt's wife states the antibiotic was Dicyclomine Bentyl but does not want this sent in is asking for the other medication mentioned to be sent to [facility] on [address].

## 2016-08-29 NOTE — Telephone Encounter (Signed)
Left a message again. Confirm he wants to take the antibiotics. Confirm his pharmacy.

## 2016-09-03 ENCOUNTER — Encounter: Payer: Self-pay | Admitting: Podiatry

## 2016-09-03 ENCOUNTER — Ambulatory Visit (INDEPENDENT_AMBULATORY_CARE_PROVIDER_SITE_OTHER): Payer: 59 | Admitting: Podiatry

## 2016-09-03 DIAGNOSIS — L603 Nail dystrophy: Secondary | ICD-10-CM | POA: Diagnosis not present

## 2016-09-03 DIAGNOSIS — Z79899 Other long term (current) drug therapy: Secondary | ICD-10-CM | POA: Diagnosis not present

## 2016-09-03 MED ORDER — TERBINAFINE HCL 250 MG PO TABS: ORAL_TABLET | ORAL | 0 refills | Status: DC

## 2016-09-03 NOTE — Progress Notes (Signed)
He presents today states that he is doing very well with his nail fungus. States that he has not started any medicines and these had no side effects from previous Lamisil therapy. He states that his toenails appear to be improving but not grown out 100% yet.  Objective: Vital signs are stable he is alert and oriented 3 there is no erythema cellulitis drainage or odor toenails appear to be 80-85% grown out.  Assessment: Onychomycosis long-term treatment with Lamisil.  Plan: Start him on another 60 day dose of medication 1 tablet every other day and I'll follow-up with him in 3-4 months area this should complete his therapy.

## 2016-12-10 ENCOUNTER — Other Ambulatory Visit: Payer: Self-pay | Admitting: Internal Medicine

## 2017-01-06 ENCOUNTER — Ambulatory Visit: Payer: 59 | Admitting: Internal Medicine

## 2017-01-07 ENCOUNTER — Encounter: Payer: Self-pay | Admitting: Podiatry

## 2017-01-07 ENCOUNTER — Ambulatory Visit (INDEPENDENT_AMBULATORY_CARE_PROVIDER_SITE_OTHER): Payer: 59 | Admitting: Podiatry

## 2017-01-07 DIAGNOSIS — Z79899 Other long term (current) drug therapy: Secondary | ICD-10-CM

## 2017-01-07 MED ORDER — TERBINAFINE HCL 250 MG PO TABS: 250.0000 mg | ORAL_TABLET | Freq: Every day | ORAL | 0 refills | Status: DC

## 2017-01-07 NOTE — Progress Notes (Signed)
He presents today states that he has completed his medication he denies any changes in his past medications or allergies. States that he did well taking medication problems. States that he is concerned that the lesser nails have not grown out all the way.  Objective: Vital signs are stable alert and oriented 3. Pulses are palpable. Hallux nail plates appear to be perfectly normal lesser nail plates. The incurvated with some thickening distally.  Assessment: Long-term therapy with Lamisil for onychomycosis.  Plan: Started him on his final dose of Lamisil every other day for the next 2 months. I will follow-up with him in 3 months. This should complete his therapy.

## 2017-02-20 DIAGNOSIS — H18413 Arcus senilis, bilateral: Secondary | ICD-10-CM | POA: Diagnosis not present

## 2017-02-20 DIAGNOSIS — H40013 Open angle with borderline findings, low risk, bilateral: Secondary | ICD-10-CM | POA: Diagnosis not present

## 2017-02-20 DIAGNOSIS — H11423 Conjunctival edema, bilateral: Secondary | ICD-10-CM | POA: Diagnosis not present

## 2017-03-13 ENCOUNTER — Other Ambulatory Visit: Payer: Self-pay

## 2017-03-13 MED ORDER — ALLOPURINOL 100 MG PO TABS: 100.0000 mg | ORAL_TABLET | Freq: Every day | ORAL | 3 refills | Status: DC

## 2017-03-21 DIAGNOSIS — H1045 Other chronic allergic conjunctivitis: Secondary | ICD-10-CM | POA: Diagnosis not present

## 2017-03-21 DIAGNOSIS — J301 Allergic rhinitis due to pollen: Secondary | ICD-10-CM | POA: Diagnosis not present

## 2017-03-21 DIAGNOSIS — J3089 Other allergic rhinitis: Secondary | ICD-10-CM | POA: Diagnosis not present

## 2017-04-03 ENCOUNTER — Ambulatory Visit (INDEPENDENT_AMBULATORY_CARE_PROVIDER_SITE_OTHER): Payer: 59 | Admitting: Physician Assistant

## 2017-04-03 ENCOUNTER — Encounter: Payer: Self-pay | Admitting: Physician Assistant

## 2017-04-03 VITALS — BP 112/70 | HR 72 | Ht 69.0 in | Wt 320.0 lb

## 2017-04-03 DIAGNOSIS — K580 Irritable bowel syndrome with diarrhea: Secondary | ICD-10-CM | POA: Diagnosis not present

## 2017-04-03 DIAGNOSIS — Z86010 Personal history of colonic polyps: Secondary | ICD-10-CM

## 2017-04-03 DIAGNOSIS — R197 Diarrhea, unspecified: Secondary | ICD-10-CM

## 2017-04-03 MED ORDER — DICYCLOMINE HCL 10 MG PO CAPS: ORAL_CAPSULE | ORAL | 0 refills | Status: DC

## 2017-04-03 NOTE — Progress Notes (Signed)
Subjective:    Patient ID: Randy Hendrix, male    DOB: Nov 10, 1965, 52 y.o.   MRN: [id number]  HPI Randy Hendrix is a pleasant 52 year old African-American male, known to Dr. Havery Moros and myself. He was last seen in the office in June 2018 at that time with complaints of intermittent abdominal pain cramping and diarrhea. He had workup with GI pathogen panel and stool for lactoferrin both of which were negative. He was given Bentyl to use on an as-needed basis and took Publishing copy for a couple of months. 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 Plan was to give him Xifaxan if symptoms persisted. He says that his diarrhea actually resolved after that office visit and he did not have any problems until around Thanksgiving target having recurrent episodes of diarrhea. He says he may have an episode every one to 2 weeks which will last a couple of days. He will get abdominal cramping some bloating urgency for bowel movements and may have 4-5 loose bowel movements per day, no bleeding. Gradually his bowel habits will get back to normal and he'll be fine for a week or so and then have a recurrence. He is unaware of any specific food intolerances or triggers. He has not been on any new medications vitamins supplements antibiotics etc. Last colonoscopy done in May 2017 with 24 mm polyps removed one was a tubular adenoma and one benign colonic mucosa. Also noted have multiple diverticuli and internal hemorrhoids.  Review of Systems Pertinent positive and negative review of systems were noted in the above HPI section.  All other review of systems was otherwise negative.  Outpatient Encounter Medications as of 04/03/2017  Medication Sig  . allopurinol (ZYLOPRIM) 100 MG tablet Take 1 tablet (100 mg total) by mouth daily.  Marland Kitchen azelastine (ASTELIN) 0.1 % nasal spray Place 2 sprays into both nostrils 2 (two) times daily as needed.  . Cyanocobalamin (VITAMIN B 12 PO) Take 1 tablet by mouth daily.  Marland Kitchen  dicyclomine (BENTYL) 10 MG capsule Take 1 tablet by mouth every 6 hours as needed for cramping and diarrhea.  . fexofenadine (ALLEGRA) 180 MG tablet Take 180 mg by mouth daily.  . Garcinia Cambogia-Chromium 500-200 MG-MCG TABS Take 1 capsule by mouth daily.  Nyoka Cowden Tea, Camillia sinensis, (GREEN TEA PO) Take 1 capsule by mouth daily.  . mometasone (NASONEX) 50 MCG/ACT nasal spray Place 2 sprays into the nose daily.  . Multiple Vitamin (MULTIVITAMIN) capsule Take 1 capsule by mouth daily.  . [DISCONTINUED] dicyclomine (BENTYL) 10 MG capsule Take 1 tablet by mouth 2-3 times daily as needed for cramping and diarrhea.  . [DISCONTINUED] mometasone (NASONEX) 50 MCG/ACT nasal spray instill 2 sprays into each nostril once daily  . [DISCONTINUED] rifaximin (XIFAXAN) 550 MG TABS tablet Take 1 tablet (550 mg total) by mouth 3 (three) times daily.  . [DISCONTINUED] saccharomyces boulardii (FLORASTOR) 250 MG capsule Take 1 capsule (250 mg total) by mouth 2 (two) times daily.  . [DISCONTINUED] terbinafine (LAMISIL) 250 MG tablet Take one tablet every other day.  . [DISCONTINUED] terbinafine (LAMISIL) 250 MG tablet Take 1 tablet (250 mg total) daily by mouth.   No facility-administered encounter medications on file as of 04/03/2017.    No Known Allergies Patient Active Problem List   Diagnosis Date Noted  . H/O adenomatous polyp of colon 08/02/2016  . Diverticulosis of colon without hemorrhage 08/02/2016  . 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 Gout 12/15/2014  . Routine health maintenance  07/28/2011  . Severe obesity (BMI >= 40) ([place]) 06/21/2008  . ALLERGIC RHINITIS 04/28/2007  . OSA (obstructive sleep apnea) 04/28/2007  . Personal history of other diseases of digestive system 04/28/2007   Social History   Socioeconomic History  . Marital status: Married    Spouse name: Annetta  . Number of children: 2  . Years of education: 3  . Highest education level: Not on file  Social Needs  .  Financial resource strain: Not on file  . Food insecurity - worry: Not on file  . Food insecurity - inability: Not on file  . Transportation needs - medical: Not on file  . Transportation needs - non-medical: Not on file  Occupational History  . Occupation: Office manager in maintenance    Employer: [employer]  Tobacco Use  . Smoking status: Never Smoker  . Smokeless tobacco: Never Used  Substance and Sexual Activity  . Alcohol use: No    Alcohol/week: 0.0 oz  . Drug use: No  . Sexual activity: Yes    Partners: Female  Other Topics Concern  . Not on file  Social History Narrative   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 Married 1990. 1 daughter 2002 and 1 son 59. SO-multiple medical problems- but currently stable. Daughter w/ petit mal seizures. Marriage is in good health.      Mr. Allred family history includes Anemia in his mother; Dementia in his father; Diabetes in his mother; Hypertension in his father; Immunodeficiency in his mother; Stomach cancer in his maternal grandmother; Stroke in his father.      Objective:    Vitals:   04/03/17 0859  BP: 112/70  Pulse: 72    Physical Exam  well-developed liver African-American male in no acute distress, very pleasant blood pressure 112/70 pulse 72, height 5 foot 9, weight 320, BMI 47.2. HEENT; nontraumatic normocephalic EOMI PERRLA sclera anicteric, Cardiovascular; regular rate and rhythm with S1-S2 no murmur or gallop, Pulmonary; clear bilaterally, Abdomen; obese soft nontender nondistended bowel sounds are active there is no palpable mass or hepatosplenomegaly, Rectal; exam not done, Ext; no clubbing cyanosis or edema skin warm and dry, Neuropsych ;mood and affect appropriate      Assessment & Plan:   #65 52 year old African-American male with 3-4 month history of intermittent episodes of mild abdominal cramping and diarrhea alternating with normal bowel movements. He also relates increased  gas and bloating. His symptoms are most consistent with IBS, rule out component of bacterial overgrowth.  #2 History of adenomatous colon polyps-up-to-date with colonoscopy last in May 2017, due for follow-up May 2022 #3 diverticulosis #4 obstructive sleep apnea #5 morbid obesity  Plan; Patient will be given a trial of Xifaxan 550 mg by mouth 3 times daily 14 days, samples given.  Will refill Bentyl 10 mg by mouth every 6 hours when necessary. Patient is asked to call back in about 3 weeks with a progress report. He'll follow-up in the office with Dr. Havery Moros or myself on an as-needed basis, he'll need a follow-up colonoscopy in 2022.  Dimitry Holsworth S Kadie Balestrieri PA-C 04/03/2017   Cc: Hoyt Koch, *

## 2017-04-03 NOTE — Patient Instructions (Signed)
We sent a refill for Bentyl ( Dicyclomine) to your pharmacy. We have given you samples of Xifaxan 550 mg.  Take 1 tab 3 times daily for 14 days.  Take with food.   Call us in 3 weeks and ask for Amy's nurse, [place]. Give [place] a progress report.

## 2017-04-04 NOTE — Progress Notes (Signed)
Agree with assessment and plan as outlined.  

## 2017-04-08 ENCOUNTER — Ambulatory Visit (INDEPENDENT_AMBULATORY_CARE_PROVIDER_SITE_OTHER): Payer: 59 | Admitting: Podiatry

## 2017-04-08 DIAGNOSIS — L603 Nail dystrophy: Secondary | ICD-10-CM | POA: Diagnosis not present

## 2017-04-08 MED ORDER — TERBINAFINE HCL 250 MG PO TABS: 250.0000 mg | ORAL_TABLET | Freq: Every day | ORAL | 0 refills | Status: DC

## 2017-04-08 NOTE — Progress Notes (Signed)
He presents today after having taken the first dose of an every other day Lamisil tablets for me.  He states that he seems to be doing very good he is happy with the outcome thus far states that it really has helped a lot and he completed his Lamisil back in January.  He states that he had no problems taking the medication whatsoever.  Objective: Toenails appear to be about 98% grown out.  There is still some residual onychomycosis left.  Pulses remain palpable no open lesions or wounds are noted.  Neurologic sensorium is intact.  Deep tendon reflexes are intact.  Assessment: Slowly resolving onychomycosis.  Plan: Encouraged him to take another round of Lamisil  250 mg tablet every other day for 60 days I will follow-up with him in 3 months if necessary.

## 2017-04-29 ENCOUNTER — Encounter: Payer: Self-pay | Admitting: Internal Medicine

## 2017-04-29 ENCOUNTER — Ambulatory Visit (INDEPENDENT_AMBULATORY_CARE_PROVIDER_SITE_OTHER): Payer: 59 | Admitting: Internal Medicine

## 2017-04-29 VITALS — BP 122/80 | HR 62 | Ht 69.0 in | Wt 318.4 lb

## 2017-04-29 DIAGNOSIS — G4726 Circadian rhythm sleep disorder, shift work type: Secondary | ICD-10-CM

## 2017-04-29 DIAGNOSIS — G4733 Obstructive sleep apnea (adult) (pediatric): Secondary | ICD-10-CM | POA: Diagnosis not present

## 2017-04-29 DIAGNOSIS — Z23 Encounter for immunization: Secondary | ICD-10-CM | POA: Diagnosis not present

## 2017-04-29 NOTE — Progress Notes (Signed)
HPI  M never smoker followed for OSA, third shift worker, complicated by morbid obesity, allergic rhinitis, GERD, NPSG 2000:  AHI 124/hr  -----------------------------------------------------------------  01/04/2016-52 year old male never smoker followed for OSA, third shift worker, complicated by GERD, allergic rhinitis, obesity CPAP 15/[facility]-download December 2016 showed excellent compliance and control. FOLLOWS FOR:DME: [employer]; Pt states wears CPAP daily for about 6 hours; pressure works well for him and new supplies needed. Will also need to order DL as patient is not in AV and did not bring SD Card. Patient reports good compliance and comfortable control with CPAP at 15. Not told that he snores through it. Feels well rested. If he forgets, family reminds him to wear it.  04/29/17- 52 year old male never smoker followed for OSA, complicated by GERD, allergic rhinitis, obesity CPAP 15/[facility] ----OSA; DME [employer]. Pt wears CPAP nightly and DL attached. Will need supply order.  Body weight today 318 pounds Now working second shift.  Denies health changes with acute problems.  Not sure how he would do without CPAP because he always uses it.  Download confirms 100% compliance, AHI 0.9/hour.  Family tells him he is not snoring.  ROS-see HPI + = positive Constitutional:    weight loss, night sweats, fevers, chills, fatigue, lassitude. HEENT:    headaches, difficulty swallowing, tooth/dental problems, sore throat,       sneezing, itching, ear ache, nasal congestion, post nasal drip, snoring CV:    chest pain, orthopnea, PND, swelling in lower extremities, anasarca,                                                      dizziness, palpitations Resp:   shortness of breath with exertion or at rest.                productive cough,   non-productive cough, coughing up of blood.              change in color of mucus.  wheezing.   Skin:    rash or  lesions. GI:  No-   heartburn, indigestion, abdominal pain, nausea, vomiting, diarrhea,                 change in bowel habits, loss of appetite GU: dysuria, change in color of urine, no urgency or frequency.   flank pain. MS:   joint pain, stiffness, decreased range of motion, back pain. Neuro-     nothing unusual Psych:  change in mood or affect.  depression or anxiety.   memory loss.  OBJ- Physical Exam General- Alert, Oriented, Affect-appropriate, Distress- none acute + obese Skin- rash-none, lesions- none, excoriation- none Lymphadenopathy- none Head- atraumatic            Eyes- Gross vision intact, PERRLA, conjunctivae and secretions clear            Ears- Hearing, canals-normal            Nose- Clear, no-Septal dev, mucus, polyps, erosion, perforation             Throat- Mallampati IV , mucosa clear , drainage- none, tonsils- atrophic Neck- flexible , trachea midline, no stridor , thyroid nl, carotid no bruit Chest - symmetrical excursion , unlabored           Heart/CV- RRR , no murmur , no gallop  ,  no rub, nl s1 s2                           - JVD- none , edema- none, stasis changes- none, varices- none           Lung- clear to P&A, wheeze- none, cough- none , dullness-none, rub- none           Chest wall-  Abd-  Br/ Gen/ Rectal- Not done, not indicated Extrem- cyanosis- none, clubbing, none, atrophy- none, strength- nl Neuro- grossly intact to observation

## 2017-04-29 NOTE — Assessment & Plan Note (Signed)
Schedule changed as he gained seniority and he is now working second shift which should be better for him.  He is not subject to call.

## 2017-04-29 NOTE — Assessment & Plan Note (Signed)
Great compliance and control as he continues to benefit from CPAP.  Needs replacement supplies, mask and tubing but can continue pressure 15.

## 2017-04-29 NOTE — Patient Instructions (Addendum)
Flu vax standard  Order- DME [facility]  Please replace supplies/ mask/ hoses, continue CPAP 15, mask of choice, humidifer, supplies, AirView

## 2017-06-21 DIAGNOSIS — G4733 Obstructive sleep apnea (adult) (pediatric): Secondary | ICD-10-CM | POA: Diagnosis not present

## 2017-07-08 ENCOUNTER — Ambulatory Visit (INDEPENDENT_AMBULATORY_CARE_PROVIDER_SITE_OTHER): Payer: 59 | Admitting: Podiatry

## 2017-07-08 ENCOUNTER — Encounter: Payer: Self-pay | Admitting: Podiatry

## 2017-07-08 DIAGNOSIS — H52223 Regular astigmatism, bilateral: Secondary | ICD-10-CM | POA: Diagnosis not present

## 2017-07-08 DIAGNOSIS — L603 Nail dystrophy: Secondary | ICD-10-CM | POA: Diagnosis not present

## 2017-07-08 DIAGNOSIS — H5211 Myopia, right eye: Secondary | ICD-10-CM | POA: Diagnosis not present

## 2017-07-08 DIAGNOSIS — H524 Presbyopia: Secondary | ICD-10-CM | POA: Diagnosis not present

## 2017-07-08 NOTE — Progress Notes (Signed)
He presents today for follow-up of the nail fungus.  States that he is completed his 120 days +2 additional doses of every other day.  He states that they appear to be grown out but not completed.  He states that I have not completed all the medication he is to have about a half a bottle left taking it every third day.  Objective: Vital signs are stable alert and oriented x3.  Toenails appear to be 90% resolved from onychomycosis.  Assessment: Resolving onychomycosis with use of long-term Lamisil therapy.  Plan: I highly recommended that he continue his therapy every other day or every third day until completely resolved.  On follow-up within 3 months if necessary.

## 2017-07-21 ENCOUNTER — Ambulatory Visit (INDEPENDENT_AMBULATORY_CARE_PROVIDER_SITE_OTHER): Payer: 59 | Admitting: Sports Medicine

## 2017-07-21 ENCOUNTER — Encounter: Payer: Self-pay | Admitting: Sports Medicine

## 2017-07-21 ENCOUNTER — Ambulatory Visit: Payer: Self-pay

## 2017-07-21 ENCOUNTER — Ambulatory Visit (INDEPENDENT_AMBULATORY_CARE_PROVIDER_SITE_OTHER): Payer: 59

## 2017-07-21 VITALS — BP 144/86 | HR 96 | Ht 69.0 in | Wt 325.4 lb

## 2017-07-21 DIAGNOSIS — M79645 Pain in left finger(s): Secondary | ICD-10-CM | POA: Diagnosis not present

## 2017-07-21 DIAGNOSIS — S63006A Unspecified dislocation of unspecified wrist and hand, initial encounter: Secondary | ICD-10-CM | POA: Diagnosis not present

## 2017-07-21 DIAGNOSIS — M65312 Trigger thumb, left thumb: Secondary | ICD-10-CM | POA: Diagnosis not present

## 2017-07-21 NOTE — Progress Notes (Signed)
Randy Hendrix. [place], [place] at [facility] - 52 y.o. male MRN [id number]  Date of birth: Jul 31, 1965  Visit Date: 07/21/2017  PCP: Hoyt Koch, MD   Referred by: Hoyt Koch, *  Scribe for today's visit: Wendy Poet, LAT, ATC     SUBJECTIVE:  Randy Hendrix is here for New Patient (Initial Visit) (L thumb pain) .   His L thumb pain symptoms INITIALLY: Began a few weeks when he thinks he dislocated thumb but isn't sure what exactly happened. Described as moderate throbbing pain, radiating to L forearm Worsened with gripping and pressure to the L thumb Improved with wearing a L thumb brace and taking Motrin Additional associated symptoms include: no N/T noted in the L hand and no swelling noted.  Does report some popping in the L thumb.    At this time symptoms show no change compared to onset  He has been wearing a thumb brace and taking Motrin.  ROS Reports night time disturbances. Denies fevers, chills, or night sweats. Denies unexplained weight loss. Denies personal history of cancer. Denies changes in bowel or bladder habits. Denies recent unreported falls. Denies new or worsening dyspnea or wheezing. Denies headaches or dizziness.  Denies numbness, tingling or weakness  In the extremities.  Denies dizziness or presyncopal episodes Denies lower extremity edema    HISTORY & PERTINENT PRIOR DATA:  Prior History reviewed and updated per electronic medical record.  Significant/pertinent history, findings, studies include:  reports that he has never smoked. He has never used smokeless tobacco. No results for input(s): HGBA1C, LABURIC, CREATINE in the last 8760 hours. No specialty comments available. Problem  Trigger Finger of Left Thumb    OBJECTIVE:  VS:  HT:5\' 9"  (175.3 cm)   WT:(Abnormal) 325 lb 6.4 oz (147.6 kg)  BMI:48.03    BP:(Abnormal) 144/86  HR:96bpm   TEMP: ( )  RESP:94 %   PHYSICAL EXAM: Constitutional: WDWN, Non-toxic appearing. Psychiatric: Alert & appropriately interactive.  Not depressed or anxious appearing. Respiratory: No increased work of breathing.  Trachea Midline Eyes: Pupils are equal.  EOM intact without nystagmus.  No scleral icterus  Vascular Exam: warm to touch no edema  upper extremity neuro exam: unremarkable normal strength normal sensation  MSK Exam: Left thumb is normally aligned.  His IP joint is ligamentously stable.  No significant pain with CMC compression.  He has marked pain with palpation of the A1 pulley and has palpable and reproducible triggering.  X-rays reviewed that are generally unremarkable. ASSESSMENT & PLAN:   1. Pain of left thumb   2. Trigger finger of left thumb     PLAN: Ultrasound-guided A1 pulley injection of the left thumb.  Follow-up in 6 weeks to ensure clinical resolution.  Use the splint that he has at nighttime as well as during the day over the next 2 weeks but I would like to see him weaning out of this as soon as possible.  Follow-up: Return in about 6 weeks (around 09/01/2017).      Please see additional documentation for Objective, Assessment and Plan sections. Pertinent additional documentation may be included in corresponding procedure notes, imaging studies, problem based documentation and patient instructions. Please see these sections of the encounter for additional information regarding this visit.  CMA/ATC served as Education administrator during this visit. History, Physical, and Plan performed by medical provider. Documentation and orders reviewed and attested to.  Michael D Rigby, DO    [facility] Physician   

## 2017-07-21 NOTE — Procedures (Signed)
PROCEDURE NOTE:  Ultrasound Guided: Injection: Left thumb, A1 pulley trigger finger injection Images were obtained and interpreted by myself, Janalee Grobe, DO  Images have been saved and stored to PACS system. Images obtained on: GE S7 Ultrasound machine    ULTRASOUND FINDINGS:  Thickening of the A1 pulley as well as the tendon with very poor tendon glide.  Normal on the right.  DESCRIPTION OF PROCEDURE:  The patient's clinical condition is marked by substantial pain and/or significant functional disability. Other conservative therapy has not provided relief, is contraindicated, or not appropriate. There is a reasonable likelihood that injection will significantly improve the patient's pain and/or functional impairment.   After discussing the risks, benefits and expected outcomes of the injection and all questions were reviewed and answered, the patient wished to undergo the above named procedure.  Verbal consent was obtained.  The ultrasound was used to identify the target structure and adjacent neurovascular structures. The skin was then prepped in sterile fashion and the target structure was injected under direct visualization using sterile technique as below:  PREP: Alcohol, Ethel Chloride and 1 cc 1% lidocaine on Insulin Needle APPROACH: direct, single injection, 25g 1.5 in. INJECTATE: 0.5 cc 1% lidocaine, 0.5 cc 0.5% Marcaine and 0.5 cc 40mg/mL DepoMedrol ASPIRATE: None DRESSING: Band-Aid  Post procedural instructions including recommending icing and warning signs for infection were reviewed.    This procedure was well tolerated and there were no complications.   IMPRESSION: Succesful Ultrasound Guided: Injection 

## 2017-07-21 NOTE — Patient Instructions (Signed)

## 2017-08-12 ENCOUNTER — Encounter: Payer: Self-pay | Admitting: Internal Medicine

## 2017-08-12 ENCOUNTER — Ambulatory Visit (INDEPENDENT_AMBULATORY_CARE_PROVIDER_SITE_OTHER): Payer: 59 | Admitting: Internal Medicine

## 2017-08-12 ENCOUNTER — Other Ambulatory Visit (INDEPENDENT_AMBULATORY_CARE_PROVIDER_SITE_OTHER): Payer: 59

## 2017-08-12 VITALS — BP 120/90 | HR 68 | Temp 98.4°F | Ht 69.0 in | Wt 319.0 lb

## 2017-08-12 DIAGNOSIS — M1A9XX0 Chronic gout, unspecified, without tophus (tophi): Secondary | ICD-10-CM

## 2017-08-12 DIAGNOSIS — Z0000 Encounter for general adult medical examination without abnormal findings: Secondary | ICD-10-CM | POA: Diagnosis not present

## 2017-08-12 LAB — COMPREHENSIVE METABOLIC PANEL
ALBUMIN: 4.3 g/dL (ref 3.5–5.2)
ALK PHOS: 65 U/L (ref 39–117)
ALT: 22 U/L (ref 0–53)
AST: 18 U/L (ref 0–37)
BILIRUBIN TOTAL: 0.9 mg/dL (ref 0.2–1.2)
BUN: 14 mg/dL (ref 6–23)
CALCIUM: 9.3 mg/dL (ref 8.4–10.5)
CO2: 27 mEq/L (ref 19–32)
Chloride: 106 mEq/L (ref 96–112)
Creatinine, Ser: 1.08 mg/dL (ref 0.40–1.50)
GFR: 92.05 mL/min (ref >60.00)
GLUCOSE: 121 mg/dL — AB (ref 70–99)
Potassium: 4.4 mEq/L (ref 3.5–5.1)
Sodium: 139 mEq/L (ref 135–145)
TOTAL PROTEIN: 7 g/dL (ref 6.0–8.3)

## 2017-08-12 LAB — CBC
HCT: 35.2 % — ABNORMAL LOW (ref 39.0–52.0)
HEMOGLOBIN: 11.3 g/dL — AB (ref 13.0–17.0)
MCHC: 32.2 g/dL (ref 30.0–36.0)
MCV: 67.5 fl — ABNORMAL LOW (ref 78.0–100.0)
PLATELETS: 213 10*3/uL (ref 150.0–400.0)
RBC: 5.22 Mil/uL (ref 4.22–5.81)
RDW: 18.2 % — ABNORMAL HIGH (ref 11.5–15.5)
WBC: 8.5 10*3/uL (ref 4.0–10.5)

## 2017-08-12 LAB — LIPID PANEL
CHOL/HDL RATIO: 3
Cholesterol: 115 mg/dL (ref 0–200)
HDL: 35.8 mg/dL — ABNORMAL LOW (ref >39.00)
LDL Cholesterol: 68 mg/dL (ref 0–99)
NONHDL: 79.02
TRIGLYCERIDES: 56 mg/dL (ref 0.0–149.0)
VLDL: 11.2 mg/dL (ref 0.0–40.0)

## 2017-08-12 LAB — URIC ACID: Uric Acid, Serum: 7 mg/dL (ref 4.0–7.8)

## 2017-08-12 LAB — HEMOGLOBIN A1C: HEMOGLOBIN A1C: 6.2 % (ref 4.6–6.5)

## 2017-08-12 NOTE — Patient Instructions (Signed)

## 2017-08-12 NOTE — Assessment & Plan Note (Signed)
Checking uric acid and adjust allopurinol if needed. No flares since last visit.

## 2017-08-12 NOTE — Assessment & Plan Note (Signed)
Flu shot yearly. Pneumonia not indicated. Shingrix added to waiting list. Tetanus up to date. Colonoscopy up to date. Counseled about sun safety and mole surveillance. Counseled about the dangers of distracted driving. Given 10 year screening recommendations.

## 2017-08-12 NOTE — Assessment & Plan Note (Signed)
Weight is stable from prior but not decreasing. He will work on diet changes.

## 2017-08-12 NOTE — Progress Notes (Signed)
   Subjective:    Patient ID: Temiloluwa Laredo, male    DOB: Apr 20, 1965, 52 y.o.   MRN: [id number]  HPI The patient is a 52 YO man coming coming in for physical. No new concerns.   PMH, [facility] social history reviewed and updated.   Review of Systems  Constitutional: Negative.   HENT: Negative.   Eyes: Negative.   Respiratory: Negative for cough, chest tightness and shortness of breath.   Cardiovascular: Negative for chest pain, palpitations and leg swelling.  Gastrointestinal: Negative for abdominal distention, abdominal pain, constipation, diarrhea, nausea and vomiting.  Musculoskeletal: Negative.   Skin: Negative.   Neurological: Negative.   Psychiatric/Behavioral: Negative.       Objective:   Physical Exam  Constitutional: He is oriented to person, place, and time. He appears well-developed and well-nourished.  HENT:  Head: Normocephalic and atraumatic.  Eyes: EOM are normal.  Neck: Normal range of motion.  Cardiovascular: Normal rate and regular rhythm.  Pulmonary/Chest: Effort normal and breath sounds normal. No respiratory distress. He has no wheezes. He has no rales.  Abdominal: Soft. Bowel sounds are normal. He exhibits no distension. There is no tenderness. There is no rebound.  Musculoskeletal: He exhibits no edema.  Neurological: He is alert and oriented to person, place, and time. Coordination normal.  Skin: Skin is warm and dry.  Psychiatric: He has a normal mood and affect.   Vitals:   08/12/17 0831  BP: 120/90  Pulse: 68  Temp: 98.4 F (36.9 C)  TempSrc: Oral  SpO2: 95%  Weight: (!) 319 lb (144.7 kg)  Height: 5\' 9"  (1.753 m)      Assessment & Plan:

## 2017-08-13 LAB — HIV ANTIBODY (ROUTINE TESTING W REFLEX): HIV 1&2 Ab, 4th Generation: NONREACTIVE

## 2017-09-01 ENCOUNTER — Encounter: Payer: Self-pay | Admitting: Sports Medicine

## 2017-09-01 ENCOUNTER — Ambulatory Visit (INDEPENDENT_AMBULATORY_CARE_PROVIDER_SITE_OTHER): Payer: 59 | Admitting: Sports Medicine

## 2017-09-01 VITALS — BP 118/86 | HR 69 | Ht 69.0 in | Wt 316.6 lb

## 2017-09-01 DIAGNOSIS — M79645 Pain in left finger(s): Secondary | ICD-10-CM | POA: Diagnosis not present

## 2017-09-01 DIAGNOSIS — M65312 Trigger thumb, left thumb: Secondary | ICD-10-CM

## 2017-09-01 NOTE — Progress Notes (Signed)
Randy Hendrix. [place], [place] at [facility] [place] - 52 y.o. male MRN [id number]  Date of birth: 08-03-1965  Visit Date: 09/01/2017  PCP: Hoyt Koch, MD   Referred by: Hoyt Koch, *  Scribe(s) for today's visit: Josepha Pigg, CMA  SUBJECTIVE:  Randy Hendrix is here for Follow-up (L thumb pain)   07/21/2017: His L thumb pain symptoms INITIALLY: Began a few weeks when he thinks he dislocated thumb but isn't sure what exactly happened. Described as moderate throbbing pain, radiating to L forearm Worsened with gripping and pressure to the L thumb Improved with wearing a L thumb brace and taking Motrin Additional associated symptoms include: no N/T noted in the L hand and no swelling noted.  Does report some popping in the L thumb.   At this time symptoms show no change compared to onset  He has been wearing a thumb brace and taking Motrin.  09/01/2017: Compared to the last office visit, his previously described symptoms are improving. He did noticed some catching last week but otherwise sx have resolves.  Current symptoms are mild & are nonradiating He wore thumb brace x 2 weeks and then d/c use. He no longer needs to take Motrin for the pain.  He received steroid injection 07/21/17 and feels that it has been beneficial.    REVIEW OF SYSTEMS: Denies night time disturbances. Denies fevers, chills, or night sweats. Denies unexplained weight loss. Denies personal history of cancer. Denies changes in bowel or bladder habits. Denies recent unreported falls. Denies new or worsening dyspnea or wheezing. Denies headaches or dizziness.  Denies numbness, tingling or weakness  In the extremities.  Denies dizziness or presyncopal episodes Denies lower extremity edema    HISTORY & PERTINENT PRIOR DATA:  Prior History reviewed and updated per electronic medical record.    Significant/pertinent history, findings, studies include:  reports that he has never smoked. He has never used smokeless tobacco. Recent Labs    08/12/17 0903  HGBA1C 6.2  LABURIC 7.0   No specialty comments available. No problems updated.  OBJECTIVE:  VS:  HT:5\' 9"  (175.3 cm)   WT:(Abnormal) 316 lb 9.6 oz (143.6 kg)  BMI:46.73    BP:118/86  HR:69bpm  TEMP: ( )  RESP:96 %   PHYSICAL EXAM: Constitutional: WDWN, Non-toxic appearing. Psychiatric: Alert & appropriately interactive.  Not depressed or anxious appearing. Respiratory: No increased work of breathing.  Trachea Midline Eyes: Pupils are equal.  EOM intact without nystagmus.  No scleral icterus  Vascular Exam: warm to touch no edema  upper extremity neuro exam: unremarkable normal strength normal sensation  MSK Exam: Thumb overall well aligned.  He is a small amount of tenderness over the A1 pulley with good flexion and extension.  No overt triggering.  He has full flexion and extension as well as abduction and opposition.  No pain with axial load and circumduction   ASSESSMENT & PLAN:   1. Pain of left thumb   2. Trigger finger of left thumb     PLAN: Overall he is doing quite well.  Symptoms have essentially resolved.  Recommend over-the-counter Aspercreme and cool water soaking if any early return of symptoms otherwise he will follow-up for repeat injections as needed.  Follow-up: Return if symptoms worsen or fail to improve.      Please see additional documentation for Objective, Assessment and Plan sections. Pertinent additional documentation may be included in corresponding procedure  notes, imaging studies, problem based documentation and patient instructions. Please see these sections of the encounter for additional information regarding this visit.  CMA/ATC served as Education administrator during this visit. History, Physical, and Plan performed by medical provider. Documentation and orders reviewed and attested  to.      Randy Hendrix, [place] [facility] Physician

## 2017-09-16 ENCOUNTER — Telehealth: Payer: Self-pay

## 2017-09-16 NOTE — Telephone Encounter (Signed)
Left message asking patient to call back to schedule nurse visit to get first shingrix vaccine, let Shooter Tangen,RN at [place] office know when appt is made so that both vaccines can be labeled

## 2017-09-17 ENCOUNTER — Ambulatory Visit (INDEPENDENT_AMBULATORY_CARE_PROVIDER_SITE_OTHER): Payer: 59 | Admitting: Physician Assistant

## 2017-09-17 ENCOUNTER — Encounter: Payer: Self-pay | Admitting: Physician Assistant

## 2017-09-17 VITALS — BP 110/72 | HR 72 | Ht 70.0 in | Wt 324.4 lb

## 2017-09-17 DIAGNOSIS — R197 Diarrhea, unspecified: Secondary | ICD-10-CM | POA: Diagnosis not present

## 2017-09-17 DIAGNOSIS — R5383 Other fatigue: Secondary | ICD-10-CM

## 2017-09-17 DIAGNOSIS — R194 Change in bowel habit: Secondary | ICD-10-CM | POA: Diagnosis not present

## 2017-09-17 DIAGNOSIS — R1031 Right lower quadrant pain: Secondary | ICD-10-CM

## 2017-09-17 NOTE — Progress Notes (Signed)
Agree with assessment and plan as outlined.  

## 2017-09-17 NOTE — Progress Notes (Signed)
Subjective:    Patient ID: Randy Hendrix, male    DOB: 1965-08-13, 52 y.o.   MRN: [id number]  HPI Randy Hendrix is a pleasant 52 year old African-American male, known to Dr. Havery Moros.  He has history of obstructive sleep apnea with CPAP use, morbid obesity, gout and history of adenomatous colon polyps. He had colonoscopy in May 2017 with removal of 2 small 4 mm polyps -also noted multiple diverticuli and internal hemorrhoids.  Path on the polyps showed 1 of these to be a tubular adenoma and the other was benign colonic mucosa. Patient was last seen in the office in February 2019 with complaints of episodic diarrhea and abdominal cramping.  He was given Bentyl to use on a as needed basis and we also gave him a course of Xifaxan for IBS D. He comes back in today with his wife.  He has had recurrence of symptoms over the past month or so.  Interestingly the patient did not remember taking the Xifaxan and really is not sure whether or not this helped though he thinks it may have as he got better until over the past 3 to 4 weeks.  This is been the pattern with his GI symptoms over the past year or so.  His wife is concerned because he is complaining of fatigue a lot of times and abdominal cramping and discomfort.  He has not had any nausea or vomiting, no fever.  Appetite is been fine.  No new meds supplements etc.  Is usually having 2-3 loose bowel movements per day, nonbloody.  Review of Systems Pertinent positive and negative review of systems were noted in the above HPI section.  All other review of systems was otherwise negative.  Outpatient Encounter Medications as of 09/17/2017  Medication Sig  . allopurinol (ZYLOPRIM) 100 MG tablet Take 1 tablet (100 mg total) by mouth daily.  Marland Kitchen azelastine (ASTELIN) 0.1 % nasal spray Place 2 sprays into both nostrils 2 (two) times daily as needed.  . Cyanocobalamin (VITAMIN B 12 PO) Take 1 tablet by mouth daily.  Marland Kitchen dicyclomine (BENTYL) 10 MG capsule Take 1  tablet by mouth every 6 hours as needed for cramping and diarrhea.  . fexofenadine (ALLEGRA) 180 MG tablet Take 180 mg by mouth daily.  . Garcinia Cambogia-Chromium 500-200 MG-MCG TABS Take 1 capsule by mouth daily.  Nyoka Cowden Tea, Camillia sinensis, (GREEN TEA PO) Take 1 capsule by mouth daily.  . mometasone (NASONEX) 50 MCG/ACT nasal spray Place 2 sprays into the nose daily.  . Multiple Vitamin (MULTIVITAMIN) capsule Take 1 capsule by mouth daily.  Marland Kitchen terbinafine (LAMISIL) 250 MG tablet Take 1 tablet (250 mg total) by mouth daily. (Patient not taking: Reported on 09/17/2017)   No facility-administered encounter medications on file as of 09/17/2017.    No Known Allergies Patient Active Problem List   Diagnosis Date Noted  . Trigger finger of left thumb 07/21/2017  . 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 Gout 12/15/2014  . Routine health maintenance 07/28/2011  . Severe obesity (BMI >= 40) ([place]) 06/21/2008  . ALLERGIC RHINITIS 04/28/2007  . OSA (obstructive sleep apnea) 04/28/2007  . Personal history of other diseases of digestive system 04/28/2007   Social History   Socioeconomic History  . Marital status: Married    Spouse name: Annetta  . Number of children: 2  . Years of education: 18  . Highest education level: Not on file  Occupational History  . Occupation: Office manager in [employer]  Employer: [employer]  . Financial resource strain: Not on file  . Food insecurity:    Worry: Not on file    Inability: Not on file  . Transportation needs:    Medical: Not on file    Non-medical: Not on file  Tobacco Use  . Smoking status: Never Smoker  . Smokeless tobacco: Never Used  Substance and Sexual Activity  . Alcohol use: No    Alcohol/week: 0.0 oz  . Drug use: No  . Sexual activity: Yes    Partners: Female  Lifestyle  . Physical activity:    Days per week: Not on file    Minutes per session: Not on file  . Stress: Not on file  Relationships  .  Social connections:    Talks on phone: Not on file    Gets together: Not on file    Attends religious service: Not on file    Active member of club or organization: Not on file    Attends meetings of clubs or organizations: Not on file    Relationship status: Not on file  . Intimate partner violence:    Fear of current or ex partner: Not on file    Emotionally abused: Not on file    Physically abused: Not on file    Forced sexual activity: Not on file  Other Topics Concern  . Not on file  Social History Narrative   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 Married 1990. 1 daughter 2002 and 1 son 50. SO-multiple medical problems- but currently stable. Daughter w/ petit mal seizures. Marriage is in good health.      Mr. Dermody family history includes Anemia in his mother; Dementia in his father; Diabetes in his mother; Hypertension in his father; Immunodeficiency in his mother; Stomach cancer in his maternal grandmother; Stroke in his father.      Objective:    Vitals:   09/17/17 0825  BP: 110/72  Pulse: 72    Physical Exam; well-developed African-American male in no acute distress, pleasant accompanied by his wife blood pressure 110/72 pulse 72, height 5 foot 10, weight 324, BMI 46.5.  HEENT ;nontraumatic normocephalic EOMI PERRLA sclera anicteric buccal mucosa moist, neck supple, Cardiovascular; regular rate and rhythm with S1-S2 no murmur rub or gallop, Pulmonary; clear bilaterally, Abdomen; obese, bowel sounds are present there is no palpable mass or hepatosplenomegaly he does have tenderness in the right mid and right lower quadrant no guarding or rebound, Rectal; exam not done, Extremities; no clubbing cyanosis or edema skin warm and dry, Neuro psych alert and oriented, mood and affect appropriate grossly nonfocal       Assessment & Plan:   #62 52 year old African-American male with recurrent bouts of abdominal cramping discomfort and loose  stools which may be present for several weeks, gradually resolves for couple of months and then recurs.  He has had recurrent symptoms over the past month. I suspect this is IBS related.  He does have ongoing tenderness however in his right abdomen and is tender on exam, rule out IBD, or other intra-abdominal inflammatory process  #2 history of adenomatous colon polyps-up-to-date with colonoscopy last done May 2017-indicated for 5-year interval follow-up  #3 fatigue-nonspecific-he had been taking Bentyl on a fairly regular basis during these episodes which may be contributing to fatigue/drowsiness  #4 morbid obesity #5 sleep apnea  Plan; stop Bentyl, Start trial of Robinul Forte 2 mg p.o. twice daily Check be met, sed  rate CRP and TSH Schedule for CT of the abdomen and pelvis with contrast. Also asked him to try IBgard 2  p.o. twice daily before meals Further plans pending results of labs and CT.    Donnalee Cellucci S Vihana Kydd PA-C 09/17/2017   Cc: Hoyt Koch, *

## 2017-09-17 NOTE — Patient Instructions (Signed)
Your provider has requested that you go to the basement level for lab work before leaving today. Press "B" on the elevator. The lab is located at the first door on the left as you exit the elevator. Stop the Bentyl ( dicyclomine ) 10 mg Try Robinul forte 2 mg ( Glycopyrolate). Take 1 tablet twice daily for cramping and abdominal pain. Try IB GARD- Take 1 capsule twice daily.   Normal BMI (Body Mass Index- based on height and weight) is between 19 and 25. Your BMI today is Body mass index is 46.54 kg/m. Marland Kitchen Please consider follow up  regarding your BMI with your Primary Care Provider.   You have been scheduled for a CT scan of the abdomen and pelvis at [facility] ([address]---this is in the same building as Press [facility]).   You are scheduled on Tuesday 09-23-2017 at 4:00 PM. You should arrive  3:45 P to your appointment time for registration. Please follow the written instructions below on the day of your exam:  WARNING: IF YOU ARE ALLERGIC TO IODINE/X-RAY DYE, PLEASE NOTIFY RADIOLOGY IMMEDIATELY AT [phone]! YOU WILL BE GIVEN A 13 HOUR PREMEDICATION PREP.  1) Do not eat  anything after 12:00 Noon (4 hours prior to your test) You can drink coffee, tea and water.  2) You have been given 2 bottles of oral contrast to drink. The solution may taste better if refrigerated, but do NOT add ice or any other liquid to this solution. Shake well before drinking.    Drink 1 bottle of contrast @ 2:00 Pm (2 hours prior to your exam)  Drink 1 bottle of contrast @ 3:00 PM (1 hour prior to your exam)  You may take any medications as prescribed with a small amount of water except for the following: Metformin, Glucophage, Glucovance, Avandamet, Riomet, Fortamet, Actoplus Met, Janumet, Glumetza or Metaglip. The above medications must be held the day of the exam AND 48 hours after the exam.  The purpose of you drinking the oral contrast is to aid in the visualization of your intestinal  tract. The contrast solution may cause some diarrhea. Before your exam is started, you will be given a small amount of fluid to drink. Depending on your individual set of symptoms, you may also receive an intravenous injection of x-ray contrast/dye. Plan on being at [facility] for 30 minutes or long, depending on the type of exam you are having performed.  If you have any questions regarding your exam or if you need to reschedule, you may call the CT department at [phone] between the hours of 8:00 am and 5:00 pm, Monday-Friday.  ________________________________________________________________________

## 2017-09-18 ENCOUNTER — Ambulatory Visit: Payer: 59 | Admitting: Physician Assistant

## 2017-09-23 ENCOUNTER — Ambulatory Visit (INDEPENDENT_AMBULATORY_CARE_PROVIDER_SITE_OTHER)
Admission: RE | Admit: 2017-09-23 | Discharge: 2017-09-23 | Disposition: A | Discharge status: Home / Self Care | Payer: 59 | Source: Ambulatory Visit | Attending: Physician Assistant | Admitting: Physician Assistant

## 2017-09-23 DIAGNOSIS — R194 Change in bowel habit: Secondary | ICD-10-CM

## 2017-09-23 DIAGNOSIS — R1031 Right lower quadrant pain: Secondary | ICD-10-CM | POA: Diagnosis not present

## 2017-09-23 DIAGNOSIS — R197 Diarrhea, unspecified: Secondary | ICD-10-CM

## 2017-09-23 DIAGNOSIS — R5383 Other fatigue: Secondary | ICD-10-CM

## 2017-09-23 DIAGNOSIS — R109 Unspecified abdominal pain: Secondary | ICD-10-CM | POA: Diagnosis not present

## 2017-09-23 MED ORDER — IOPAMIDOL (ISOVUE-300) INJECTION 61%
100.0000 mL | Freq: Once | INTRAVENOUS | Status: AC | PRN
  Administered 2017-09-23: 100 mL via INTRAVENOUS

## 2017-09-29 ENCOUNTER — Telehealth: Payer: Self-pay

## 2017-09-29 NOTE — Telephone Encounter (Signed)
Pt wanting to get at 8/13 appt with [facility]. Appt notes changed in appt.

## 2017-09-29 NOTE — Telephone Encounter (Signed)
He returned my call and I gave him the CT results.

## 2017-09-30 ENCOUNTER — Encounter: Payer: Self-pay | Admitting: Internal Medicine

## 2017-09-30 ENCOUNTER — Other Ambulatory Visit (INDEPENDENT_AMBULATORY_CARE_PROVIDER_SITE_OTHER): Payer: 59

## 2017-09-30 ENCOUNTER — Ambulatory Visit (INDEPENDENT_AMBULATORY_CARE_PROVIDER_SITE_OTHER): Payer: 59 | Admitting: Internal Medicine

## 2017-09-30 VITALS — BP 124/84 | HR 77 | Temp 98.4°F | Ht 70.0 in | Wt 319.0 lb

## 2017-09-30 DIAGNOSIS — R350 Frequency of micturition: Secondary | ICD-10-CM

## 2017-09-30 DIAGNOSIS — Z23 Encounter for immunization: Secondary | ICD-10-CM | POA: Diagnosis not present

## 2017-09-30 DIAGNOSIS — R591 Generalized enlarged lymph nodes: Secondary | ICD-10-CM | POA: Insufficient documentation

## 2017-09-30 LAB — URINALYSIS, ROUTINE W REFLEX MICROSCOPIC
Bilirubin Urine: NEGATIVE
Hgb urine dipstick: NEGATIVE
KETONES UR: NEGATIVE
Leukocytes, UA: NEGATIVE
NITRITE: NEGATIVE
PH: 6 (ref 5.0–8.0)
RBC / HPF: NONE SEEN (ref 0–?)
SPECIFIC GRAVITY, URINE: 1.025 (ref 1.000–1.030)
Total Protein, Urine: NEGATIVE
Urine Glucose: NEGATIVE
Urobilinogen, UA: 0.2 (ref 0.0–1.0)
WBC, UA: NONE SEEN (ref 0–?)

## 2017-09-30 NOTE — Telephone Encounter (Signed)
Both vaccines labeled and placed in refrig

## 2017-09-30 NOTE — Patient Instructions (Signed)
Likely the lymph nodes are just slightly larger than normal and they will check another CT scan in the next 3 months.   We will check the urine today.

## 2017-09-30 NOTE — Progress Notes (Signed)
   Subjective:    Patient ID: Randy Hendrix, male    DOB: 1965-07-09, 52 y.o.   MRN: [id number]  HPI The patient is a 52 YO man coming in for CT results. coming in for CT results. His GI doctor did a CT scan of his abdomen and told him to come see his PCP for something about the results. He is not sure why. They have not communicated with [place] the reason. In review of the CT scan he has several likely cysts in his kidneys, not able to detect if there were any kidney stones, some mild non-specific thickening of the bladder and a LN peripancreatic and inguinal. The CT scan results recommend urinalysis but he has not had this done. He denies urinary symptoms. No genital pain or discharge. Some frequency but no burning. Denies blood in urine. Stable diarrhea and discomfort in abdomen symptoms.   Review of Systems  Constitutional: Negative.   HENT: Negative.   Eyes: Negative.   Respiratory: Negative for cough, chest tightness and shortness of breath.   Cardiovascular: Negative for chest pain, palpitations and leg swelling.  Gastrointestinal: Positive for abdominal pain and diarrhea. Negative for abdominal distention, constipation, nausea and vomiting.       Stable  Genitourinary: Positive for frequency. Negative for difficulty urinating, dysuria, flank pain and hematuria.  Musculoskeletal: Negative.   Skin: Negative.   Neurological: Negative.   Psychiatric/Behavioral: Negative.       Objective:   Physical Exam  Constitutional: He is oriented to person, place, and time. He appears well-developed and well-nourished.  Overweight  HENT:  Head: Normocephalic and atraumatic.  Eyes: EOM are normal.  Neck: Normal range of motion.  Cardiovascular: Normal rate and regular rhythm.  Pulmonary/Chest: Effort normal and breath sounds normal. No respiratory distress. He has no wheezes. He has no rales.  Abdominal: Soft. Bowel sounds are normal. He exhibits no distension. There is no tenderness. There is no rebound.  Right  inguinal region without obvious LAD detected  Musculoskeletal: He exhibits no edema.  Neurological: He is alert and oriented to person, place, and time. Coordination normal.  Skin: Skin is warm and dry.   Vitals:   09/30/17 0756  BP: 124/84  Pulse: 77  Temp: 98.4 F (36.9 C)  TempSrc: Oral  SpO2: 97%  Weight: (!) 319 lb (144.7 kg)  Height: 5\' 10"  (1.778 m)      Assessment & Plan:  Shingrix given at visit

## 2017-09-30 NOTE — Assessment & Plan Note (Signed)
Most of the LN on CT scan are borderline and not felt on exam. Suspect may be simply variant on normal. Checking U/A per CT recommendations and urine culture. Okay with GI checking CT in 3 months but not sure this is necessary.

## 2017-10-01 LAB — URINE CULTURE
MICRO NUMBER:: 90959004
RESULT: NO GROWTH
SPECIMEN QUALITY:: ADEQUATE

## 2017-10-09 ENCOUNTER — Encounter: Payer: Self-pay | Admitting: Podiatry

## 2017-10-09 ENCOUNTER — Ambulatory Visit (INDEPENDENT_AMBULATORY_CARE_PROVIDER_SITE_OTHER): Payer: 59 | Admitting: Podiatry

## 2017-10-09 DIAGNOSIS — L603 Nail dystrophy: Secondary | ICD-10-CM

## 2017-10-09 MED ORDER — TERBINAFINE HCL 250 MG PO TABS: 250.0000 mg | ORAL_TABLET | Freq: Every day | ORAL | 0 refills | Status: DC

## 2017-10-09 NOTE — Progress Notes (Signed)
He presents today for follow-up of his Lamisil fungus he is been taking medicine every third day and states that they seem to be getting better but just growing very very slowly.  Objective: Vital signs are stable alert and oriented x3.  Pulses are palpable.  Nails appear to be nearly 100% clear with small amount of residual fungus to the lesser nail plates.  Assessment: Well-healing onychomycosis with use of Lamisil.  Plan: We are going to do another 30 tablets 1 every other day and I will follow-up with him in 3 to 4 months.  Dispensed prescription today.

## 2017-12-01 ENCOUNTER — Encounter: Payer: Self-pay | Admitting: Gastroenterology

## 2017-12-01 ENCOUNTER — Ambulatory Visit (INDEPENDENT_AMBULATORY_CARE_PROVIDER_SITE_OTHER): Payer: 59 | Admitting: Gastroenterology

## 2017-12-01 VITALS — BP 110/70 | HR 72 | Ht 68.11 in | Wt 323.1 lb

## 2017-12-01 DIAGNOSIS — K589 Irritable bowel syndrome without diarrhea: Secondary | ICD-10-CM | POA: Diagnosis not present

## 2017-12-01 DIAGNOSIS — R935 Abnormal findings on diagnostic imaging of other abdominal regions, including retroperitoneum: Secondary | ICD-10-CM | POA: Diagnosis not present

## 2017-12-01 MED ORDER — METHYLCELLULOSE (LAXATIVE) PO POWD: ORAL | Status: AC

## 2017-12-01 NOTE — Patient Instructions (Addendum)
If you are age 53 or older, your body mass index should be between 23-30. Your Body mass index is 48.97 kg/m. If this is out of the aforementioned range listed, please consider follow up with your Primary Care Provider.  If you are age 21 or younger, your body mass index should be between 19-25. Your Body mass index is 48.97 kg/m. If this is out of the aformentioned range listed, please consider follow up with your Primary Care Provider.   We are giving you a Low FOD-Map diet  Purchase Citrucel over the counter and take daily.  Continue IBgard.  You have been scheduled for a CT scan of the abdomen and pelvis at [place] [facility] ([address]---this is in the same building as [employer]).   You are scheduled on __________________________________.  You should arrive 15 minutes prior to your appointment time for registration. Please follow the written instructions below on the day of your exam:  WARNING: IF YOU ARE ALLERGIC TO IODINE/X-RAY DYE, PLEASE NOTIFY RADIOLOGY IMMEDIATELY AT [phone]! YOU WILL BE GIVEN A 13 HOUR PREMEDICATION PREP.  1) Do not eat anything after _____________ (4 hours prior to your test) 2) You have been given 2 bottles of oral contrast to drink. The solution may taste better if refrigerated, but do NOT add ice or any other liquid to this solution. Shake well before drinking.    Drink 1 bottle of contrast @ ________ (2 hours prior to your exam)  Drink 1 bottle of contrast @ _________ (1 hour prior to your exam)  You may take any medications as prescribed with a small amount of water, if necessary. If you take any of the following medications: METFORMIN, GLUCOPHAGE, GLUCOVANCE, AVANDAMET, RIOMET, FORTAMET, [place] MET, JANUMET, GLUMETZA or METAGLIP, you MAY be asked to HOLD this medication 48 hours AFTER the exam.  The purpose of you drinking the oral contrast is to aid in the visualization of your intestinal tract. The contrast solution may cause  some diarrhea. Depending on your individual set of symptoms, you may also receive an intravenous injection of x-ray contrast/dye. Plan on being at [facility] for 30 minutes or longer, depending on the type of exam you are having performed.  This test typically takes 30-45 minutes to complete.  If you have any questions regarding your exam or if you need to reschedule, you may call the CT department at [phone] between the hours of 8:00 am and 5:00 pm, Monday-Friday.  ____________________________________________________________   Thank you for entrusting me with your care and for choosing [facility], Dr. [place] Cellar

## 2017-12-01 NOTE — Progress Notes (Signed)
HPI :  52 year old male here for a follow-up visit. He's been followed in our office for suspected irritable bowel syndrome. He had a colonoscopy in 2017 which showed 1 small tubular adenoma along with diverticulosis and internal hemorrhoids.  He reports in late 2017 he developed severe "food poisoning" which led to an emergency department visit. Since that time he reports altered bowel habits with intermittent loose stools and constipation. He has had some ongoing gas and bloating which bothers him as well as intermittent cramps. He denies any blood in the stools. He has cycles of constipation and loose stools and then can be normal in between. He endorses significant gas with this. More recently he has not had much pains or cramps. His weight been stable. She had a CT scan done for some of the symptoms in August as outlined below, no significant pathology noted to correlate with symptoms. He has not had any pain in the right inguinal area at all in regards to lymphadenopathy noted on CT scan. He denies any epigastric pain relation to prominent peripancreatic lymph node noted on CT scan. He has been using IB Donald Prose for some of these symptoms which she reports has helped and has reduced his cramping. He continues to have a lot a gas and bloating which bothers him as well as altered bowels. He stopped using Bentyl and had a trial of Robinul which he didn't think helped too much. He has had a trial of rifaximin during this time, he can't recall much that helped. He's had GI pathogen panel which has been negative.   Of note he has a chronic microcytic anemia. 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 Allergic rhinitis   . ALLERGIC RHINITIS   . Allergy   . Gastroenteritis   . GERD (gastroesophageal reflux disease)    occasionally  . Hemorrhoid   . Kidney stone on right side 05/2004  . OBESITY, CLASS II   . OSA (obstructive sleep apnea)    CPAP qhs  . Oxygen deficiency   . PATELLAR DISLOCATION, RIGHT   . Sleep apnea    wears cpap     Past Surgical History:  Procedure Laterality Date  . HEMORRHOID SURGERY    . WISDOM TOOTH EXTRACTION     Family History  Problem Relation Age of Onset  . Diabetes Mother   . Anemia Mother   . Immunodeficiency Mother   . Stroke Father   . Dementia Father   . Hypertension Father   . Stomach cancer Maternal Grandmother   . Colon cancer Neg Hx   . Prostate cancer Neg Hx   . Cancer Neg Hx   . Heart disease Neg Hx   . COPD Neg Hx   . Colon polyps Neg Hx   . Esophageal cancer Neg Hx   . Rectal cancer Neg Hx    Social History   Tobacco Use  . Smoking status: Never Smoker  . Smokeless tobacco: Never Used  Substance Use Topics  . Alcohol use: No    Alcohol/week: 0.0 standard drinks  . Drug use: No   Current Outpatient Medications  Medication Sig Dispense Refill  .  allopurinol (ZYLOPRIM) 100 MG tablet Take 1 tablet (100 mg total) by mouth daily. 90 tablet 3  . azelastine (ASTELIN) 0.1 % nasal spray Place 2 sprays into both nostrils 2 (two) times daily as needed.  0  . Cyanocobalamin (VITAMIN B 12 PO) Take 1 tablet by mouth daily.    Marland Kitchen dicyclomine (BENTYL) 10 MG capsule Take 1 tablet by mouth every 6 hours as needed for cramping and diarrhea. 90 capsule 0  . fexofenadine (ALLEGRA) 180 MG tablet Take 180 mg by mouth daily.    . Garcinia Cambogia-Chromium 500-200 MG-MCG TABS Take 1 capsule by mouth daily.    Nyoka Cowden Tea, Camillia sinensis, (GREEN TEA PO) Take 1 capsule by mouth daily.    . mometasone (NASONEX) 50 MCG/ACT nasal spray Place 2 sprays  into the nose daily.  1  . Multiple Vitamin (MULTIVITAMIN) capsule Take 1 capsule by mouth daily.    Marland Kitchen terbinafine (LAMISIL) 250 MG tablet Take 1 tablet (250 mg total) by mouth daily. 30 tablet 0   No current facility-administered medications for this visit.    No Known Allergies   Review of Systems: All systems reviewed and negative except where noted in HPI.   Lab Results  Component Value Date   WBC 8.5 08/12/2017   HGB 11.3 (L) 08/12/2017   HCT 35.2 (L) 08/12/2017   MCV 67.5 Repeated and verified X2. (L) 08/12/2017   PLT 213.0 08/12/2017    Lab Results  Component Value Date   CREATININE 1.08 08/12/2017   BUN 14 08/12/2017   NA 139 08/12/2017   K 4.4 08/12/2017   CL 106 08/12/2017   CO2 27 08/12/2017    Lab Results  Component Value Date   ALT 22 08/12/2017   AST 18 08/12/2017   ALKPHOS 65 08/12/2017   BILITOT 0.9 08/12/2017      Physical Exam: BP 110/70 (BP Location: Left Arm, Patient Position: Sitting, Cuff Size: Normal)   Pulse 72   Ht 5' 8.11" (1.73 m)   Wt (!) 323 lb 2 oz (146.6 kg)   BMI 48.97 kg/m  Constitutional: Pleasant,well-developed, male in no acute distress. HEENT: Normocephalic and atraumatic. Conjunctivae are normal. No scleral icterus. Neck supple.  Cardiovascular: Normal rate, regular rhythm.  Pulmonary/chest: Effort normal and breath sounds normal. No wheezing, rales or rhonchi. Abdominal: Soft, nondistended, nontender. . There are no masses palpable. No hepatomegaly. Extremities: no inguinal lymphadenompathy on R side, no edema Lymphadenopathy: No cervical adenopathy noted. Neurological: Alert and oriented to person place and time. Skin: Skin is warm and dry. No rashes noted. Psychiatric: Normal mood and affect. Behavior is normal.   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 IB Donald Prose has helped and we will continue that. I discussed other options with them for management of this. He has a lot of gas and bloating, I'm recommending a trial of a low FODMAP diet for which she was counseled and given supplemental information. Regarding his altering bowel habits I'm recommending a daily fiber supplement such as Citrucel to hopefully provide some regularity but minimize the amount of bloating which can come with fiber supplementation. We will see how he does and this regimen. If no improvement any stills problems with bloating, we may  consider a trial of a probiotic. He unfortunately did not have too much benefit with rifaximin in the past however. Reassured patient regarding diagnosis, he should call me in the next month or 2 if no improvement to discuss options.  Abnormal CT scan - lymphadenopathy in the right inguinal region as well as peripancreatic and location - were not pathologic but prominent and of unclear significance. He has no lymphadenopathy I can appreciate on exam. I discussed options with him, he wants to reevaluate with CT scan in another 1-2 months to check for interval changes. We'll let him know results when completed.   Pyote Cellar, MD [facility]

## 2017-12-02 ENCOUNTER — Encounter: Payer: Self-pay | Admitting: Internal Medicine

## 2017-12-02 ENCOUNTER — Ambulatory Visit (INDEPENDENT_AMBULATORY_CARE_PROVIDER_SITE_OTHER): Payer: 59 | Admitting: Internal Medicine

## 2017-12-02 VITALS — BP 118/88 | HR 76 | Temp 97.9°F | Ht 68.11 in | Wt 325.0 lb

## 2017-12-02 DIAGNOSIS — H8110 Benign paroxysmal vertigo, unspecified ear: Secondary | ICD-10-CM

## 2017-12-02 DIAGNOSIS — H6121 Impacted cerumen, right ear: Secondary | ICD-10-CM | POA: Diagnosis not present

## 2017-12-02 DIAGNOSIS — Z23 Encounter for immunization: Secondary | ICD-10-CM

## 2017-12-02 NOTE — Progress Notes (Signed)
   Subjective:    Patient ID: Randy Hendrix, male    DOB: 09/17/1965, 52 y.o.   MRN: [id number]  HPI The patient is a 52 YO man coming in for dizziness. Usually happening when he lies down in bed. Going on for 2-3 weeks and overall mild. Happened yesterday when he was looking up a certain way. This worried him and so he decided to come in. He had to lean against the bed to steady himself. It lasted less than 5 minutes. Overall stable since onset. Has not tried anything for it. Denies ear pain or pressure. Some allergy symptoms but not taking anything for that. Denies fevers or chills.   Review of Systems  Constitutional: Negative.   HENT: Positive for congestion, postnasal drip and rhinorrhea.   Eyes: Negative.   Respiratory: Negative for cough, chest tightness and shortness of breath.   Cardiovascular: Negative for chest pain, palpitations and leg swelling.  Gastrointestinal: Negative for abdominal distention, abdominal pain, constipation, diarrhea, nausea and vomiting.  Musculoskeletal: Negative.   Skin: Negative.   Neurological: Positive for dizziness.  Psychiatric/Behavioral: Negative.       Objective:   Physical Exam  Constitutional: He is oriented to person, place, and time. He appears well-developed and well-nourished.  HENT:  Head: Normocephalic and atraumatic.  Right TM unable to visualize due to wax, after disimpaction TM normal bilaterally, oropharynx with redness and clear drainage.   Eyes: EOM are normal.  Neck: Normal range of motion.  Cardiovascular: Normal rate and regular rhythm.  Pulmonary/Chest: Effort normal and breath sounds normal. No respiratory distress. He has no wheezes. He has no rales.  Abdominal: Soft. Bowel sounds are normal. He exhibits no distension. There is no tenderness. There is no rebound.  Musculoskeletal: He exhibits no edema.  Neurological: He is alert and oriented to person, place, and time. Coordination normal.  Skin: Skin is warm and  dry.  Psychiatric: He has a normal mood and affect.   Vitals:   12/02/17 1100  BP: 118/88  Pulse: 76  Temp: 97.9 F (36.6 C)  TempSrc: Oral  SpO2: 98%  Weight: (!) 325 lb (147.4 kg)  Height: 5' 8.11" (1.73 m)      Assessment & Plan:  Flu shot given at visit

## 2017-12-02 NOTE — Patient Instructions (Addendum)
We have cleaned out the ear.   We have sent in antibiotic for your wife augmentin 1 pill twice a day for 10 days. We have also ordered the CT scan of the head for your wife and given her 2 shots today for the headache.

## 2017-12-04 DIAGNOSIS — H6121 Impacted cerumen, right ear: Secondary | ICD-10-CM | POA: Insufficient documentation

## 2017-12-04 DIAGNOSIS — H8110 Benign paroxysmal vertigo, unspecified ear: Secondary | ICD-10-CM | POA: Insufficient documentation

## 2017-12-04 NOTE — Assessment & Plan Note (Signed)
Disimpacted during visit. Unclear if this is related to symptoms.

## 2017-12-04 NOTE — Assessment & Plan Note (Addendum)
Given epley maneuver and instructions. Given short duration of symptoms would not recommend meclizine. If not improving can do vestibular therapy. Advised to start zyrtec daily.

## 2017-12-09 ENCOUNTER — Ambulatory Visit (INDEPENDENT_AMBULATORY_CARE_PROVIDER_SITE_OTHER): Payer: 59 | Admitting: Sports Medicine

## 2017-12-09 ENCOUNTER — Ambulatory Visit: Payer: Self-pay

## 2017-12-09 ENCOUNTER — Encounter: Payer: Self-pay | Admitting: Sports Medicine

## 2017-12-09 VITALS — BP 120/82 | HR 80 | Ht 68.11 in | Wt 321.2 lb

## 2017-12-09 DIAGNOSIS — M65312 Trigger thumb, left thumb: Secondary | ICD-10-CM | POA: Diagnosis not present

## 2017-12-09 DIAGNOSIS — M79645 Pain in left finger(s): Secondary | ICD-10-CM | POA: Diagnosis not present

## 2017-12-09 NOTE — Progress Notes (Signed)
Randy Hendrix. Randy Hendrix, [place] [facility] - 52 y.o. male MRN [id number]  Date of birth: 09/20/1965  Visit Date: 12/09/2017  PCP: Hoyt Koch, MD   Referred by: Hoyt Koch, *  Scribe(s) for today's visit: Josepha Pigg, CMA  SUBJECTIVE:  Randy Hendrix is here for Follow-up (L thumb pain)   HPI:  07/21/2017: His L thumb pain symptoms INITIALLY: Began a few weeks when he thinks he dislocated thumb but isn't sure what exactly happened. Described as moderate throbbing pain, radiating to L forearm Worsened with gripping and pressure to the L thumb Improved with wearing a L thumb brace and taking Motrin Additional associated symptoms include: no N/T noted in the L hand and no swelling noted.  Does report some popping in the L thumb.   At this time symptoms show no change compared to onset  He has been wearing a thumb brace and taking Motrin.  09/01/2017: Compared to the last office visit, his previously described symptoms are improving. He did noticed some catching last week but otherwise sx have resolves.  Current symptoms are mild & are nonradiating He wore thumb brace x 2 weeks and then d/c use. He no longer needs to take Motrin for the pain.  He received steroid injection 07/21/17 and feels that it has been beneficial.   12/09/2017: Compared to the last office visit, his previously described symptoms are worsening. Initially he had some improvement but sx started to flare-up again a few weeks ago. Sx come and go. He has noticed increased popping and catching.  Current symptoms are mild-moderate & are radiating to the anterior lower arm, this is rare.  He has been wearing thumb brace and feels like it has been beneficial.   REVIEW OF SYSTEMS: Reports night time disturbances. Denies fevers, chills, or night sweats. Denies unexplained weight loss. Denies personal history of  cancer. Denies changes in bowel or bladder habits. Denies recent unreported falls. Denies new or worsening dyspnea or wheezing. Denies headaches or dizziness.  Denies numbness, tingling or weakness  In the extremities.  Reports dizziness or presyncopal episodes - occasional vertigo. Denies lower extremity edema    HISTORY:  Prior history reviewed and updated per electronic medical record.  Social History   Occupational History  . Occupation: Office manager in maintenance    Employer: [employer]  Tobacco Use  . Smoking status: Never Smoker  . Smokeless tobacco: Never Used  Substance and Sexual Activity  . Alcohol use: No    Alcohol/week: 0.0 standard drinks  . Drug use: No  . Sexual activity: Yes    Partners: Female   Social History   Social History Narrative   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 Married 1990. 1 daughter 2002 and 1 son 49. SO-multiple medical problems- but currently stable. Daughter w/ petit mal seizures. Marriage is in good health.      DATA OBTAINED & REVIEWED:   Recent Labs    08/12/17 0903  HGBA1C 6.2  LABURIC 7.0    OBJECTIVE:  VS:  HT:5' 8.11" (173 cm)   WT:(!) 321 lb 3.2 oz (145.7 kg)  BMI:48.68    BP:120/82  HR:80bpm  TEMP: ( )  RESP:96 %   PHYSICAL EXAM: CONSTITUTIONAL: Well-developed, Well-nourished and In no acute distress PSYCHIATRIC: Alert & appropriately interactive. and Not depressed or anxious appearing. RESPIRATORY: No increased work of breathing and Trachea Midline EYES:  Pupils are equal., EOM intact without nystagmus. and No scleral icterus.  VASCULAR EXAM: Warm and well perfused NEURO: unremarkable  MSK Exam: Left hand  Thumb overall well aligned.  He is a small amount of tenderness over the A1 pulley with good flexion and extension.  No overt triggering.  He has full flexion and extension as well as abduction and opposition.  No pain with axial load and circumduction      ASSESSMENT   1. Pain of left thumb   2. Trigger finger of left thumb     PLAN:  Pertinent additional documentation may be included in corresponding procedure notes, imaging studies, problem based documentation and patient instructions.  Procedures:  . US Guided Injection per procedure note  Medications:  No orders of the defined types were placed in this encounter.  Discussion/Instructions: No problem-specific Assessment & Plan notes found for this encounter.  . Repeat injection performed today.  Continue with thumb spica splinting the next especially at nighttime. .   . Discussed red flag symptoms that warrant earlier emergent evaluation and patient voices understanding. . Activity modifications and the importance of avoiding exacerbating activities (limiting pain to no more than a 4 / 10 during or following activity) recommended and discussed.  Follow-up:  . Return in about 8 weeks (around 02/03/2018) for repeat diagnostic ultrasound.   . If any lack of improvement consider: referral to Orthopedics for Trigger finger release if needed.  . At follow up will plan to consider: Repeat diagnostic MSK ultrasound to ensure improved tendon glide and consideration of repeat injections if persistent triggering.     CMA/ATC served as Education administrator during this visit. History, Physical, and Plan performed by medical provider. Documentation and orders reviewed and attested to.      Gerda Diss, [facility] Physician

## 2017-12-09 NOTE — Patient Instructions (Signed)
You had an injection today.  Things to be aware of after injection are listed below: . You may experience no significant improvement or even a slight worsening in your symptoms during the first 24 to 48 hours.  After that we expect your symptoms to improve gradually over the next 2 weeks for the medicine to have its maximal effect.  You should continue to have improvement out to 6 weeks after your injection. . Dr. Gustabo Gordillo recommends icing the site of the injection for 20 minutes  1-2 times the day of your injection . You may shower but no swimming, tub bath or Jacuzzi for 24 hours. . If your bandage falls off this does not need to be replaced.  It is appropriate to remove the bandage after 4 hours. . You may resume light activities as tolerated unless otherwise directed per Dr. Amai Cappiello during your visit  POSSIBLE STEROID SIDE EFFECTS:  Side effects from injectable steroids tend to be less than when taken orally however you may experience some of the symptoms listed below.  If experienced these should only last for a short period of time. Change in menstrual flow  Edema (swelling)  Increased appetite Skin flushing (redness)  Skin rash/acne  Thrush (oral) Yeast vaginitis    Increased sweating  Depression Increased blood glucose levels Cramping and leg/calf  Euphoria (feeling happy)  POSSIBLE PROCEDURE SIDE EFFECTS: The side effects of the injection are usually fairly minimal however if you may experience some of the following side effects that are usually self-limited and will is off on their own.  If you are concerned please feel free to call the office with questions:  Increased numbness or tingling  Nausea or vomiting  Swelling or bruising at the injection site   Please call our office if if you experience any of the following symptoms over the next week as these can be signs of infection:   Fever greater than 100.5F  Significant swelling at the injection site  Significant redness or drainage  from the injection site  If after 2 weeks you are continuing to have worsening symptoms please call our office to discuss what the next appropriate actions should be including the potential for a return office visit or other diagnostic testing.    

## 2017-12-09 NOTE — Progress Notes (Signed)
PROCEDURE NOTE:  Ultrasound Guided: Injection: Left thumb, A1 pulley trigger finger injection Images were obtained and interpreted by myself, Teresa Coombs, DO  Images have been saved and stored to PACS system. Images obtained on: GE S7 Ultrasound machine    ULTRASOUND FINDINGS:  Thickening of the A1 pulley as well as the tendon with very poor tendon glide.  Normal on the right.  DESCRIPTION OF PROCEDURE:  The patient's clinical condition is marked by substantial pain and/or significant functional disability. Other conservative therapy has not provided relief, is contraindicated, or not appropriate. There is a reasonable likelihood that injection will significantly improve the patient's pain and/or functional impairment.   After discussing the risks, benefits and expected outcomes of the injection and all questions were reviewed and answered, the patient wished to undergo the above named procedure.  Verbal consent was obtained.  The ultrasound was used to identify the target structure and adjacent neurovascular structures. The skin was then prepped in sterile fashion and the target structure was injected under direct visualization using sterile technique as below:  PREP: Alcohol, Ethel Chloride and 1 cc 1% lidocaine on Insulin Needle APPROACH: direct, single injection, 25g 1.5 in. INJECTATE: 0.5 cc 1% lidocaine, 0.5 cc 0.5% Marcaine and 0.5 cc 40mg /mL DepoMedrol ASPIRATE: None DRESSING: Band-Aid  Post procedural instructions including recommending icing and warning signs for infection were reviewed.    This procedure was well tolerated and there were no complications.   IMPRESSION: Succesful Ultrasound Guided: Injection

## 2018-01-05 ENCOUNTER — Ambulatory Visit (INDEPENDENT_AMBULATORY_CARE_PROVIDER_SITE_OTHER)
Admission: RE | Admit: 2018-01-05 | Discharge: 2018-01-05 | Disposition: A | Discharge status: Home / Self Care | Payer: 59 | Source: Ambulatory Visit | Attending: Gastroenterology | Admitting: Gastroenterology

## 2018-01-05 DIAGNOSIS — K589 Irritable bowel syndrome without diarrhea: Secondary | ICD-10-CM | POA: Diagnosis not present

## 2018-01-05 DIAGNOSIS — R935 Abnormal findings on diagnostic imaging of other abdominal regions, including retroperitoneum: Secondary | ICD-10-CM

## 2018-01-05 DIAGNOSIS — N289 Disorder of kidney and ureter, unspecified: Secondary | ICD-10-CM | POA: Diagnosis not present

## 2018-01-05 MED ORDER — IOPAMIDOL (ISOVUE-300) INJECTION 61%
100.0000 mL | Freq: Once | INTRAVENOUS | Status: AC | PRN
  Administered 2018-01-05: 100 mL via INTRAVENOUS

## 2018-01-06 ENCOUNTER — Ambulatory Visit (INDEPENDENT_AMBULATORY_CARE_PROVIDER_SITE_OTHER): Payer: 59 | Admitting: Podiatry

## 2018-01-06 DIAGNOSIS — M79676 Pain in unspecified toe(s): Secondary | ICD-10-CM

## 2018-01-06 DIAGNOSIS — B351 Tinea unguium: Secondary | ICD-10-CM

## 2018-01-06 NOTE — Progress Notes (Signed)
He presents today for follow-up of his Lamisil he is been on it for nearly a year now states that is been doing really well and his toenails are almost completely completely grown out he would like to have his nails trimmed today.  Objective: Toenails are long thick yellow dystrophic clinically mycotic to the very tip of the toenails.  Assessment: Pain in limb secondary to onychomycosis long-term therapy for onychomycosis with Lamisil.  Plan: Toenails 1 through 5 bilateral today follow-up with him as needed.  I like to follow-up with him in about 4 months if he is not completely resolved.

## 2018-01-07 ENCOUNTER — Other Ambulatory Visit: Payer: 59

## 2018-02-02 ENCOUNTER — Ambulatory Visit: Payer: Self-pay

## 2018-02-02 ENCOUNTER — Ambulatory Visit (INDEPENDENT_AMBULATORY_CARE_PROVIDER_SITE_OTHER): Payer: 59 | Admitting: Sports Medicine

## 2018-02-02 ENCOUNTER — Encounter: Payer: Self-pay | Admitting: Sports Medicine

## 2018-02-02 VITALS — BP 128/78 | HR 86 | Ht 68.0 in | Wt 323.8 lb

## 2018-02-02 DIAGNOSIS — M79645 Pain in left finger(s): Secondary | ICD-10-CM

## 2018-02-02 DIAGNOSIS — M65312 Trigger thumb, left thumb: Secondary | ICD-10-CM

## 2018-02-02 MED ORDER — DICLOFENAC SODIUM 2 % TD SOLN: 1.0000 "application " | Freq: Two times a day (BID) | TRANSDERMAL | 0 refills | Status: AC

## 2018-02-02 MED ORDER — DICLOFENAC SODIUM 2 % TD SOLN: 1.0000 "application " | Freq: Two times a day (BID) | TRANSDERMAL | 2 refills | Status: DC

## 2018-02-02 NOTE — Procedures (Signed)
LIMITED MSK ULTRASOUND OF Left thumb Images were obtained and interpreted by myself, Teresa Coombs, DO  Images have been saved and stored to PACS system. Images obtained on: GE S7 Ultrasound machine  FINDINGS:   A1 pulley does have a moderate amount of thickening and is appreciated on ultrasound.  The flexor tendon does have a good glide pattern with only a small amount of surrounding soft tissue deformation when going from fully flexed to fully extended.  IP joint has a very small amount of degenerative change with small osteophytic spurs.  No significant joint effusion.  Mild synovitis only.  IMPRESSION:  1. Left thumb trigger thumb, mild 2. Mild osteoarthritis of the IP joint, left thumb

## 2018-02-02 NOTE — Patient Instructions (Signed)
Pennsaid instructions: You have been given a sample/prescription for Pennsaid, a topical medication.     You are to apply this gel to your injured body part twice daily (morning and evening).   A little goes a long way so you can use about a pea-sized amount for each area.   Spread this small amount over the area into a thin film and let it dry.   Be sure that you do not rub the gel into your skin for more than 10 or 15 seconds otherwise it can irritate you skin.    Once you apply the gel, please do not put any other lotion or clothing in contact with that area for 30 minutes to allow the gel to absorb into your skin.   Some people are sensitive to the medication and can develop a sunburn-like rash.  If you have only mild symptoms it is okay to continue to use the medication but if you have any breakdown of your skin you should discontinue its use and please let us know.   If you have been written a prescription for Pennsaid, you will receive a pump bottle of this topical gel through a mail order pharmacy.  The instructions on the bottle will say to apply two pumps twice a day which may be too much gel for your particular area so use the pea-sized amount as your guide.   Instructions for Duexis, Pennsaid and Vimovo:  Your prescription will be filled through a participating [facility] mail order pharmacy.  You will receive a phone call or text from one of the participating pharmacies which can be located in any state in the [place].  You must communicate directly with them to have this medication filled.  When the pharmacy contacts you, they will need your mailing address (for shipment of the medication) andy they will need payment information if you have a copay (typically no more than $10). If you have not heard from them 2-3 days after your appointment with Dr. Paulla Fore, contact [facility] directly at [phone].

## 2018-02-02 NOTE — Progress Notes (Signed)
Randy Hendrix. Nashla Althoff, [place] [facility] [place] - 52 y.o. male MRN [id number]  Date of birth: 01-05-1966  Visit Date:   PCP: Hoyt Koch, MD   Referred by: Hoyt Koch, *   SUBJECTIVE:  Chief Complaint  Patient presents with  . Follow-up    L thumb pain    HPI: Patient is here for 8-week follow-up after his second left thumb trigger thumb injection.  He is continued to have occasional catching without significant locking.  He reports this is mild.  Overall significantly improved.  Does have some pain that is into the IP joint of the thumb.  He has not been using a brace or using any type patient.  REVIEW OF SYSTEMS: Denies any nighttime disturbances fevers chills or night sweats.  He occasionally gets vertigo but otherwise has no neurologic associated symptoms.  HISTORY:  Prior history reviewed and updated per electronic medical record.  Social History   Occupational History  . Occupation: Office manager in maintenance    Employer: [employer]  Tobacco Use  . Smoking status: Never Smoker  . Smokeless tobacco: Never Used  Substance and Sexual Activity  . Alcohol use: No    Alcohol/week: 0.0 standard drinks  . Drug use: No  . Sexual activity: Yes    Partners: Female   Social History   Social History Narrative   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 Married 1990. 1 daughter 2002 and 1 son 61. SO-multiple medical problems- but currently stable. Daughter w/ petit mal seizures. Marriage is in good health.        DATA OBTAINED & REVIEWED:  Recent Labs    08/12/17 0903  HGBA1C 6.2  LABURIC 7.0  CALCIUM 9.3  AST 18  ALT 22   No problems updated. No specialty comments available.  OBJECTIVE:  VS:  HT:5\' 8"  (172.7 cm)   WT:(!) 323 lb 12.8 oz (146.9 kg)  BMI:49.25    BP:128/78  HR:86bpm  TEMP: ( )  RESP:94 %   PHYSICAL EXAM: Left thumb  is overall well aligned.  He has pain that is mild over the A1 pulley.  There is minimal catching and popping.  Does have a slight nodule appreciated.  Otherwise his leg is ligamentously stable.  Grip strength intact.   ASSESSMENT  1. Pain of left thumb   2. Trigger finger of left thumb     PLAN:  Pertinent additional documentation may be included in corresponding procedure notes, imaging studies, problem based documentation and patient instructions.  Procedures:  None  Medications:  Meds ordered this encounter  Medications  . Diclofenac Sodium (PENNSAID) 2 % SOLN    Sig: Place 1 application onto the skin 2 (two) times daily for 1 day.    Dispense:  8 g    Refill:  0  . Diclofenac Sodium (PENNSAID) 2 % SOLN    Sig: Place 1 application onto the skin 2 (two) times daily.    Dispense:  112 g    Refill:  2    Home Phone      [phone] Mobile          [phone]     Discussion/Instructions: No problem-specific Assessment & Plan notes found for this encounter.   I would like for him to begin using Pennsaid twice a day for the next 4 weeks and then wean off as tolerated.  Ultimately does still  have some triggering that is appreciable on the ultrasound but it is minimal and he effectively has only mild symptoms from time to time.  We discussed that reducing the friction by reducing the inflammation and vice versa is the goal of therapy.  If any lack of improvement or recurrent triggering in the next several months would need referral to hand surgery for consideration of A1 pulley release.  Otherwise if we have fairly sustained in long-term relief could consider repeat injections in the future if needed. If any lack of improvement: consider referral to Orthopedics for A1 pulley release   No follow-ups on file.          Gerda Diss, [facility] Physician

## 2018-02-18 ENCOUNTER — Ambulatory Visit (HOSPITAL_COMMUNITY)
Admission: EM | Admit: 2018-02-18 | Discharge: 2018-02-18 | Disposition: A | Discharge status: Home / Self Care | Payer: 59 | Attending: Family Medicine | Admitting: Family Medicine

## 2018-02-18 ENCOUNTER — Encounter (HOSPITAL_COMMUNITY): Payer: Self-pay

## 2018-02-18 DIAGNOSIS — M545 Low back pain, unspecified: Secondary | ICD-10-CM

## 2018-02-18 MED ORDER — NAPROXEN 375 MG PO TABS: 375.0000 mg | ORAL_TABLET | Freq: Two times a day (BID) | ORAL | 0 refills | Status: DC

## 2018-02-18 MED ORDER — CYCLOBENZAPRINE HCL 10 MG PO TABS: 10.0000 mg | ORAL_TABLET | Freq: Every day | ORAL | 0 refills | Status: DC

## 2018-02-18 NOTE — ED Triage Notes (Signed)
Pt presents with flank/side pain.  Pt has history of kidney stones.

## 2018-02-18 NOTE — ED Provider Notes (Signed)
[place]   [id number] 02/18/18 Arrival Time: 7628  CC: Low back pain  SUBJECTIVE: History from: patient. Randy Hendrix is a 53 y.o. male complains of RT low back pain that began 2 days ago.  Denies a precipitating event or specific injury, but does repetitive twisting movement about the torso for work.  Localizes the pain to the RT low back.  Describes the pain as constant and achy, and intermittently sharp in character.  Has tried OTC medications without relief.  Symptoms are made worse with movements.  Denies similar symptoms in the past. Hx significant for kidney stones, states this does not feel like kidney stones.   Denies fever, chills, erythema, ecchymosis, effusion, weakness, numbness and tingling, saddle paresthesias, loss of bowel or bladder function, changes in urinary habits.        ROS: As per HPI.  Past Medical History:  Diagnosis Date  . Allergic rhinitis   . ALLERGIC RHINITIS   . Allergy   . Gastroenteritis   . GERD (gastroesophageal reflux disease)    occasionally  . Hemorrhoid   . Kidney stone on right side 05/2004  . OBESITY, CLASS II   . OSA (obstructive sleep apnea)    CPAP qhs  . Oxygen deficiency   . PATELLAR DISLOCATION, RIGHT   . Sleep apnea    wears cpap   Past Surgical History:  Procedure Laterality Date  . HEMORRHOID SURGERY    . WISDOM TOOTH EXTRACTION     No Known Allergies No current facility-administered medications on file prior to encounter.    Current Outpatient Medications on File Prior to Encounter  Medication Sig Dispense Refill  . allopurinol (ZYLOPRIM) 100 MG tablet Take 1 tablet (100 mg total) by mouth daily. 90 tablet 3  . azelastine (ASTELIN) 0.1 % nasal spray Place 2 sprays into both nostrils 2 (two) times daily as needed.  0  . Cyanocobalamin (VITAMIN B 12 PO) Take 1 tablet by mouth daily.    . Diclofenac Sodium (PENNSAID) 2 % SOLN Place 1 application onto the skin 2 (two) times daily. 112 g 2  . dicyclomine  (BENTYL) 10 MG capsule Take 1 tablet by mouth every 6 hours as needed for cramping and diarrhea. 90 capsule 0  . fexofenadine (ALLEGRA) 180 MG tablet Take 180 mg by mouth daily.    . Garcinia Cambogia-Chromium 500-200 MG-MCG TABS Take 1 capsule by mouth daily.    Nyoka Cowden Tea, Camillia sinensis, (GREEN TEA PO) Take 1 capsule by mouth daily.    . methylcellulose (CITRUCEL) oral powder Citrucel: use as directed once daily    . mometasone (NASONEX) 50 MCG/ACT nasal spray Place 2 sprays into the nose daily.  1  . Multiple Vitamin (MULTIVITAMIN) capsule Take 1 capsule by mouth daily.     Social History   Socioeconomic History  . Marital status: Married    Spouse name: Annetta  . Number of children: 2  . Years of education: 62  . Highest education level: Not on file  Occupational History  . Occupation: Office manager in maintenance    Employer: [employer]  . Financial resource strain: Not on file  . Food insecurity:    Worry: Not on file    Inability: Not on file  . Transportation needs:    Medical: Not on file    Non-medical: Not on file  Tobacco Use  . Smoking status: Never Smoker  . Smokeless tobacco: Never Used  Substance and Sexual Activity  .  Alcohol use: No    Alcohol/week: 0.0 standard drinks  . Drug use: No  . Sexual activity: Yes    Partners: Female  Lifestyle  . Physical activity:    Days per week: Not on file    Minutes per session: Not on file  . Stress: Not on file  Relationships  . Social connections:    Talks on phone: Not on file    Gets together: Not on file    Attends religious service: Not on file    Active member of club or organization: Not on file    Attends meetings of clubs or organizations: Not on file    Relationship status: Not on file  . Intimate partner violence:    Fear of current or ex partner: Not on file    Emotionally abused: Not on file    Physically abused: Not on file    Forced sexual activity: Not on file  Other  Topics Concern  . Not on file  Social History Narrative   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 Married 1990. 1 daughter 2002 and 1 son 2. SO-multiple medical problems- but currently stable. Daughter w/ petit mal seizures. Marriage is in good health.     Family History  Problem Relation Age of Onset  . Diabetes Mother   . Anemia Mother   . Immunodeficiency Mother   . Stroke Father   . Dementia Father   . Hypertension Father   . Stomach cancer Maternal Grandmother   . Colon cancer Neg Hx   . Prostate cancer Neg Hx   . Cancer Neg Hx   . Heart disease Neg Hx   . COPD Neg Hx   . Colon polyps Neg Hx   . Esophageal cancer Neg Hx   . Rectal cancer Neg Hx     OBJECTIVE:  Vitals:   02/18/18 1818  BP: 128/87  Pulse: 84  Resp: 20  Temp: 98.1 F (36.7 C)  TempSrc: Oral  SpO2: 99%    General appearance: Alert; in no acute distress.  Head: NCAT Lungs: CTA bilaterally Heart: RRR.  Clear S1 and S2 without murmur, gallops, or rubs.  Radial pulses 2+ bilaterally. Musculoskeletal: Lumbar spine Inspection: Skin warm, dry, clear and intact without obvious erythema, effusion, or ecchymosis.  Palpation: Diffusely tender about the RT paravertebral muscles ROM: FROM active and passive Strength: 5/5 shld abduction, 5/5 shld adduction, 5/5 elbow flexion, 5/5 elbow extension, 5/5 grip strength, 5/5 hip flexion, 5/5 knee abduction, 5/5 knee adduction, 5/5 knee flexion, 5/5 knee extension Skin: warm and dry Neurologic: Ambulates without difficulty; Sensation intact about the upper/ lower extremities Psychological: alert and cooperative; normal mood and affect  ASSESSMENT & PLAN:  1. Acute right-sided low back pain without sciatica     Meds ordered this encounter  Medications  . cyclobenzaprine (FLEXERIL) 10 MG tablet    Sig: Take 1 tablet (10 mg total) by mouth at bedtime.    Dispense:  15 tablet    Refill:  0    Order Specific Question:   Supervising  Provider    Answer:   Raylene Everts [[id number]]  . naproxen (NAPROSYN) 375 MG tablet    Sig: Take 1 tablet (375 mg total) by mouth 2 (two) times daily.    Dispense:  20 tablet    Refill:  0    Order Specific Question:   Supervising Provider    Answer:   Raylene Everts [[id number]]  Continue conservative management of rest, ice, heat, and gentle stretches Take naproxen as needed for pain relief (may cause abdominal discomfort, ulcers, and GI bleeds avoid taking with other NSAIDs) Take cyclobenzaprine at nighttime for symptomatic relief. Avoid driving or operating heavy machinery while using medication. Follow up with PCP if symptoms persist Return or go to the ER if you have any new or worsening symptoms (fever, chills, chest pain, abdominal pain, changes in bowel or bladder habits, pain radiating into lower legs, etc...)   Reviewed expectations re: course of current medical issues. Questions answered. Outlined signs and symptoms indicating need for more acute intervention. Patient verbalized understanding. After Visit Summary given.    Lestine Box, PA-C 02/18/18 1943

## 2018-02-18 NOTE — Discharge Instructions (Addendum)
Continue conservative management of rest, ice, heat, and gentle stretches Take naproxen as needed for pain relief (may cause abdominal discomfort, ulcers, and GI bleeds avoid taking with other NSAIDs) Take cyclobenzaprine at nighttime for symptomatic relief. Avoid driving or operating heavy machinery while using medication. Follow up with PCP if symptoms persist Return or go to the ER if you have any new or worsening symptoms (fever, chills, chest pain, abdominal pain, changes in bowel or bladder habits, pain radiating into lower legs, etc...)

## 2018-02-24 DIAGNOSIS — H401121 Primary open-angle glaucoma, left eye, mild stage: Secondary | ICD-10-CM | POA: Diagnosis not present

## 2018-02-24 DIAGNOSIS — H47232 Glaucomatous optic atrophy, left eye: Secondary | ICD-10-CM | POA: Diagnosis not present

## 2018-02-24 DIAGNOSIS — H40051 Ocular hypertension, right eye: Secondary | ICD-10-CM | POA: Diagnosis not present

## 2018-03-03 ENCOUNTER — Ambulatory Visit (INDEPENDENT_AMBULATORY_CARE_PROVIDER_SITE_OTHER): Payer: 59 | Admitting: *Deleted

## 2018-03-03 DIAGNOSIS — Z23 Encounter for immunization: Secondary | ICD-10-CM | POA: Diagnosis not present

## 2018-03-06 ENCOUNTER — Ambulatory Visit: Payer: 59 | Admitting: Physician Assistant

## 2018-03-10 ENCOUNTER — Other Ambulatory Visit (INDEPENDENT_AMBULATORY_CARE_PROVIDER_SITE_OTHER): Payer: 59

## 2018-03-10 ENCOUNTER — Ambulatory Visit (INDEPENDENT_AMBULATORY_CARE_PROVIDER_SITE_OTHER): Payer: 59 | Admitting: Nurse Practitioner

## 2018-03-10 ENCOUNTER — Encounter

## 2018-03-10 ENCOUNTER — Encounter: Payer: Self-pay | Admitting: Nurse Practitioner

## 2018-03-10 VITALS — BP 128/64 | HR 60 | Ht 68.0 in | Wt 327.6 lb

## 2018-03-10 DIAGNOSIS — K5731 Diverticulosis of large intestine without perforation or abscess with bleeding: Secondary | ICD-10-CM

## 2018-03-10 DIAGNOSIS — K625 Hemorrhage of anus and rectum: Secondary | ICD-10-CM | POA: Diagnosis not present

## 2018-03-10 LAB — CBC
HCT: 35.5 % — ABNORMAL LOW (ref 39.0–52.0)
HEMOGLOBIN: 11.2 g/dL — AB (ref 13.0–17.0)
MCHC: 31.6 g/dL (ref 30.0–36.0)
PLATELETS: 247 10*3/uL (ref 150.0–400.0)
RBC: 5.18 Mil/uL (ref 4.22–5.81)
RDW: 18.8 % — ABNORMAL HIGH (ref 11.5–15.5)
WBC: 10.2 10*3/uL (ref 4.0–10.5)

## 2018-03-10 NOTE — Progress Notes (Signed)
Agree with assessment and plan as outlined. Agree likely diverticular bleed, sounds like it resolved, if it recurs he should contact us.

## 2018-03-10 NOTE — Progress Notes (Signed)
Chief Complaint:    Rectal bleeding  IMPRESSION and PLAN:    53 yo male with three days of painless hematochezia, large volume with clots (photos on phone). Bleeding occurred several days ago, lasted 3 days. Normal BMs without blood since 03/02/18. Only minimally inflamed internal hemorrhoids on anoscopy. Based on volume of blood / clots I suspect this may have been a diverticular hemorrhage. He does have diverticulosis, seen on CT scan November 2019.  -cbc today, will call with results.  -patient will call office ASAP if any recurrent bleeding.  -I explained diverticular disease to the patient including potential for recurrent bleeding at some point.   HPI:     Patient is a 53 year old male known to obesity, OSA, chronic microcytic anemia.  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  Patient is followed by Dr. 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  He had a CT scan for above symptoms in August 2019 which revealed lymphadenopathy in the right inguinal region as well as the peripancreatic area.  Plan was for follow-up CT scan of the abdomen and pelvis which he had done in mid November 2019.  Lymph nodes were stable, felt to be most likely of no clinical insignificance.   She comes in today for evaluation of rectal bleeding.  Bleeding was painless, started on 02/27/2018.  He doesn't take blood thinners but does takes Voltaren about twice a week. On the way to the bathroom patient started "spraying" blood from rectum.  Following that he had dark red blood in otherwise brown bowel stool.  Bleeding was not preceded by constipation/straining nor diarrhea. On the first day of bleeding he did have increased frequency of bowel movements, each time with blood / clots.  He bled again on 02/28/18 and 03/02/18 but on those days he only had  1-2 bowel movements.  Patient brings in a picture of the bleeding, it does appear it was very large volume with clots of blood.  No bleeding since 03/02/2018.  No nausea / vomiting. No associated dizziness, weakness nor shortness of breath.  Patient had screening colonoscopy May 2017 One 4 mm polyp in the transverse colon, removed with a cold snare. Resected and retrieved. - One 4 mm polyp in the descending colon, removed with a cold snare. Resected and retrieved. - Diverticulosis in the left colon. - Non-bleeding internal hemorrhoids. 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   Review of systems:     Occasional vertigo, no chest pain, no SOB, no fevers, no urinary sx   Past Medical History:  Diagnosis Date  . Allergic rhinitis   . ALLERGIC RHINITIS   . Allergy   . Gastroenteritis   . GERD (gastroesophageal reflux disease)    occasionally  . Hemorrhoid   . Kidney stone on right side 05/2004  . OBESITY, CLASS II   . OSA (obstructive sleep apnea)    CPAP qhs  . Oxygen deficiency   . PATELLAR DISLOCATION, RIGHT   . Sleep apnea    wears cpap    Patient's surgical history, family medical history, social history, medications and allergies were all reviewed in Epic   Creatinine clearance cannot be calculated (Patient's most recent lab result is older than the maximum 21 days allowed.)  Current Outpatient Medications  Medication Sig Dispense Refill  .  allopurinol (ZYLOPRIM) 100 MG tablet Take 1 tablet (100 mg total) by mouth daily. 90 tablet 3  . azelastine (ASTELIN) 0.1 % nasal spray Place 2 sprays into both nostrils 2 (two) times daily as needed.  0  . Cyanocobalamin (VITAMIN B 12 PO) Take 1 tablet by mouth daily.    . cyclobenzaprine (FLEXERIL) 10 MG tablet Take 1 tablet (10 mg total) by mouth at bedtime. 15 tablet 0  . Diclofenac Sodium (PENNSAID) 2 % SOLN Place 1 application onto the skin 2 (two) times daily. 112 g 2  . dicyclomine  (BENTYL) 10 MG capsule Take 1 tablet by mouth every 6 hours as needed for cramping and diarrhea. 90 capsule 0  . fexofenadine (ALLEGRA) 180 MG tablet Take 180 mg by mouth daily.    . Garcinia Cambogia-Chromium 500-200 MG-MCG TABS Take 1 capsule by mouth daily.    Nyoka Cowden Tea, Camillia sinensis, (GREEN TEA PO) Take 1 capsule by mouth daily.    . methylcellulose (CITRUCEL) oral powder Citrucel: use as directed once daily    . mometasone (NASONEX) 50 MCG/ACT nasal spray Place 2 sprays into the nose daily.  1  . Multiple Vitamin (MULTIVITAMIN) capsule Take 1 capsule by mouth daily.    . naproxen (NAPROSYN) 375 MG tablet Take 1 tablet (375 mg total) by mouth 2 (two) times daily. 20 tablet 0   No current facility-administered medications for this visit.     Physical Exam:     BP 128/64   Pulse 60   Ht 5\' 8"  (1.727 m)   Wt (!) 327 lb 9.6 oz (148.6 kg)   SpO2 96%   BMI 49.81 kg/m   GENERAL:  Pleasant male in NAD PSYCH: : Cooperative, normal affect EENT:  conjunctiva pink, mucous membranes moist, neck supple without masses CARDIAC:  RRR, no murmur heard, no peripheral edema PULM: Normal respiratory effort, lungs CTA bilaterally, no wheezing ABDOMEN:  Nondistended, soft, nontender. No obvious masses,  normal bowel sounds RECTAL:  No external lesions. On anoscopy there was soft, goldish colored stool in vault precluding good view but no significant enlarged hemorrhoids as far as I could tell.  SKIN:  turgor, no lesions seen Musculoskeletal:  Normal muscle tone, normal strength NEURO: Alert and oriented x 3, no focal neurologic deficits   Tye Savoy , NP 03/10/2018, 9:23 AM

## 2018-03-10 NOTE — Patient Instructions (Signed)
If you are age 54 or older, your body mass index should be between 23-30. Your Body mass index is 49.81 kg/m. If this is out of the aforementioned range listed, please consider follow up with your Primary Care Provider.  If you are age 58 or younger, your body mass index should be between 19-25. Your Body mass index is 49.81 kg/m. If this is out of the aformentioned range listed, please consider follow up with your Primary Care Provider.   Your provider has requested that you go to the basement level for lab work before leaving today. Press "B" on the elevator. The lab is located at the first door on the left as you exit the elevator. CBC  You have been given samples of IBgard.  Call if any recurrent bleeding.  Thank you for choosing me and [facility].   Tye Savoy, NP

## 2018-03-22 ENCOUNTER — Other Ambulatory Visit: Payer: Self-pay | Admitting: Internal Medicine

## 2018-04-07 ENCOUNTER — Ambulatory Visit (INDEPENDENT_AMBULATORY_CARE_PROVIDER_SITE_OTHER): Payer: 59 | Admitting: Internal Medicine

## 2018-04-07 ENCOUNTER — Encounter: Payer: Self-pay | Admitting: Internal Medicine

## 2018-04-07 VITALS — BP 132/82 | HR 92 | Temp 98.8°F | Ht 68.0 in | Wt 326.0 lb

## 2018-04-07 DIAGNOSIS — R6889 Other general symptoms and signs: Secondary | ICD-10-CM | POA: Diagnosis not present

## 2018-04-07 LAB — POC INFLUENZA A&B (BINAX/QUICKVUE)
INFLUENZA A, POC: NEGATIVE
Influenza B, POC: NEGATIVE

## 2018-04-07 MED ORDER — BENZONATATE 200 MG PO CAPS: 200.0000 mg | ORAL_CAPSULE | Freq: Three times a day (TID) | ORAL | 0 refills | Status: DC | PRN

## 2018-04-07 MED ORDER — METHYLPREDNISOLONE ACETATE 40 MG/ML IJ SUSP
40.0000 mg | Freq: Once | INTRAMUSCULAR | Status: AC
  Administered 2018-04-07: 40 mg via INTRAMUSCULAR

## 2018-04-07 NOTE — Assessment & Plan Note (Addendum)
Quite possibly did have flu about 3 weeks ago. Flu test negative in the office today. At this time leftover sinus congestion and cough. Rx for tessalon perles and depo-medrol 40 mg IM given to help dry up his congestion. Continue allegra and flonase.

## 2018-04-07 NOTE — Progress Notes (Signed)
   Subjective:   Patient ID: Randy Hendrix, male    DOB: Jul 14, 1965, 53 y.o.   MRN: [id number]  HPI  The patient is a 53 y.o. man coming in for cold symptoms. Started 2-3 weeks ago. Main symptoms are: cough and congestion. He was very ill the first week with chills and fatigue. This has improved but he is still draining and still coughing. Denies fevers or chills. Denies SOB. Cough is non-productive. Overall it is stable. Has tried mucinex DM and allegra and night time medication for cold which has helped but not much. He is also taking nasacort also.  Review of Systems  Constitutional: Positive for activity change, appetite change and chills. Negative for fatigue, fever and unexpected weight change.  HENT: Positive for congestion, postnasal drip, rhinorrhea and sinus pressure. Negative for ear discharge, ear pain, sinus pain, sneezing, sore throat, tinnitus, trouble swallowing and voice change.   Eyes: Negative.   Respiratory: Positive for cough. Negative for chest tightness, shortness of breath and wheezing.   Cardiovascular: Negative.   Gastrointestinal: Negative.   Musculoskeletal: Positive for myalgias.  Neurological: Negative.     Objective:  Physical Exam Constitutional:      Appearance: He is well-developed.  HENT:     Head: Normocephalic and atraumatic.     Comments: Oropharynx with redness and clear drainage, nose with swollen turbinates, TMs normal bilaterally.  Neck:     Musculoskeletal: Normal range of motion.     Thyroid: No thyromegaly.  Cardiovascular:     Rate and Rhythm: Normal rate and regular rhythm.  Pulmonary:     Effort: Pulmonary effort is normal. No respiratory distress.     Breath sounds: Normal breath sounds. No wheezing or rales.  Abdominal:     Palpations: Abdomen is soft.  Musculoskeletal:        General: Tenderness present.  Lymphadenopathy:     Cervical: No cervical adenopathy.  Skin:    General: Skin is warm and dry.  Neurological:   Mental Status: He is alert and oriented to person, place, and time.     Vitals:   04/07/18 0845  BP: 132/82  Pulse: 92  Temp: 98.8 F (37.1 C)  TempSrc: Oral  SpO2: 97%  Weight: (!) 326 lb (147.9 kg)  Height: 5\' 8"  (1.727 m)   Flu test negative.  Assessment & Plan:  Depo-medrol 40 mg IM given at visit

## 2018-04-07 NOTE — Patient Instructions (Signed)
We have given you a steroid shot today to help dry up the congestion.   Your flu test is negative.   We have sent in tessalon perles to use for cough up to 3 times per day. This is a non-drowsy medication.

## 2018-04-13 DIAGNOSIS — H40053 Ocular hypertension, bilateral: Secondary | ICD-10-CM | POA: Diagnosis not present

## 2018-04-13 DIAGNOSIS — H2513 Age-related nuclear cataract, bilateral: Secondary | ICD-10-CM | POA: Diagnosis not present

## 2018-04-29 ENCOUNTER — Encounter: Payer: Self-pay | Admitting: Internal Medicine

## 2018-04-30 ENCOUNTER — Ambulatory Visit (INDEPENDENT_AMBULATORY_CARE_PROVIDER_SITE_OTHER): Payer: 59 | Admitting: Internal Medicine

## 2018-04-30 DIAGNOSIS — G4733 Obstructive sleep apnea (adult) (pediatric): Secondary | ICD-10-CM | POA: Diagnosis not present

## 2018-05-01 ENCOUNTER — Encounter: Payer: Self-pay | Admitting: Internal Medicine

## 2018-05-05 ENCOUNTER — Other Ambulatory Visit: Payer: Self-pay

## 2018-05-05 ENCOUNTER — Encounter: Payer: Self-pay | Admitting: Podiatry

## 2018-05-05 ENCOUNTER — Ambulatory Visit (INDEPENDENT_AMBULATORY_CARE_PROVIDER_SITE_OTHER): Payer: 59 | Admitting: Podiatry

## 2018-05-05 DIAGNOSIS — B351 Tinea unguium: Secondary | ICD-10-CM | POA: Diagnosis not present

## 2018-05-05 DIAGNOSIS — M79676 Pain in unspecified toe(s): Secondary | ICD-10-CM | POA: Diagnosis not present

## 2018-05-05 NOTE — Progress Notes (Signed)
He presents today for follow-up of his onychomycosis.  He states that he feels that it is completely grown out he has completed all of his medications without any complications whatsoever.  Objective: Toenails are long and thick some nail dystrophy appears to be present at the very end cannot be 100% sure that this is not some remaining onychomycosis but it looks much better than before we started with.  Assessment: Well-healing onychomycosis nail dystrophy long nails.  Plan: I debrided his nails for him today and will follow-up with him in 3 months for reevaluation to make sure that there is no reoccurrence if the nail tips appear to be thickened the slightest then will probably run him on another 30 days of Lamisil.

## 2018-05-12 NOTE — Assessment & Plan Note (Signed)
Explained that normalized weight offers real benefits, including ability to get away from OSA treatment. Consider Bariatric referral

## 2018-05-12 NOTE — Assessment & Plan Note (Signed)
He continues to benefit from CPAP with better sleep. Download confirms good compliance and control. Plan- reorder supplies, continue CPAP 15

## 2018-05-12 NOTE — Progress Notes (Signed)
HPI  M never smoker followed for OSA, third shift worker, complicated by morbid obesity, allergic rhinitis, GERD, NPSG 2000:  AHI 124/hr  -----------------------------------------------------------------  04/29/17- 53 year old male never smoker followed for OSA, complicated by GERD, allergic rhinitis, obesity CPAP 15/[facility] ----OSA; DME [employer]. Pt wears CPAP nightly and DL attached. Will need supply order.  Body weight today 318 pounds Now working second shift.  Denies health changes with acute problems.  Not sure how he would do without CPAP because he always uses it.  Download confirms 100% compliance, AHI 0.9/hour.  Family tells him he is not snoring.  04/30/2018- 53 year old male never smoker followed for OSA, complicated by GERD, allergic rhinitis, obesity CPAP 15/[facility] Download 98% compliance, AHI 1.1/ hr Body weight today 327 lbs UTD on flu and pvax Better with CPAP. No concerns, no other health issues reported today. Understands he is too heavy. Needs supplies.  ROS-see HPI + = positive Constitutional:    weight loss, night sweats, fevers, chills, fatigue, lassitude. HEENT:    headaches, difficulty swallowing, tooth/dental problems, sore throat,       sneezing, itching, ear ache, nasal congestion, post nasal drip, snoring CV:    chest pain, orthopnea, PND, swelling in lower extremities, anasarca,                                                    dizziness, palpitations Resp:   shortness of breath with exertion or at rest.                productive cough,   non-productive cough, coughing up of blood.              change in color of mucus.  wheezing.   Skin:    rash or lesions. GI:  No-   heartburn, indigestion, abdominal pain, nausea, vomiting, diarrhea,                 change in bowel habits, loss of appetite GU: dysuria, change in color of urine, no urgency or frequency.   flank pain. MS:   joint pain, stiffness, decreased range of  motion, back pain. Neuro-     nothing unusual Psych:  change in mood or affect.  depression or anxiety.   memory loss.  OBJ- Physical Exam General- Alert, Oriented, Affect-appropriate, Distress- none acute + obese Skin- rash-none, lesions- none, excoriation- none Lymphadenopathy- none Head- atraumatic            Eyes- Gross vision intact, PERRLA, conjunctivae and secretions clear            Ears- Hearing, canals-normal            Nose- Clear, no-Septal dev, mucus, polyps, erosion, perforation             Throat- Mallampati IV , mucosa clear , drainage- none, tonsils- atrophic Neck- flexible , trachea midline, no stridor , thyroid nl, carotid no bruit Chest - symmetrical excursion , unlabored           Heart/CV- RRR , no murmur , no gallop  , no rub, nl s1 s2                           - JVD- none , edema- none, stasis changes- none, varices- none  Lung- clear to P&A, wheeze- none, cough- none , dullness-none, rub- none           Chest wall-  Abd-  Br/ Gen/ Rectal- Not done, not indicated Extrem- cyanosis- none, clubbing, none, atrophy- none, strength- nl Neuro- grossly intact to observation

## 2018-05-12 NOTE — Patient Instructions (Signed)
Please try hard to keep weight down  Ordered- continue CPAP 15, replace mask of choice,humidifier,  supplies

## 2018-05-22 ENCOUNTER — Telehealth: Payer: Self-pay | Admitting: Internal Medicine

## 2018-05-22 DIAGNOSIS — G4733 Obstructive sleep apnea (adult) (pediatric): Secondary | ICD-10-CM

## 2018-05-22 NOTE — Telephone Encounter (Signed)
Order placed for cpap supplies.. Dr. Annamaria Boots please sign order.

## 2018-05-22 NOTE — Telephone Encounter (Addendum)
Spoke with pt, he states he needs an order sent to [facility] for CPAP mask. Advised pt that the order was sent to [facility]. Nothing further is needed.

## 2018-07-02 ENCOUNTER — Ambulatory Visit (INDEPENDENT_AMBULATORY_CARE_PROVIDER_SITE_OTHER): Payer: 59 | Admitting: Internal Medicine

## 2018-07-02 ENCOUNTER — Encounter: Payer: Self-pay | Admitting: Internal Medicine

## 2018-07-02 DIAGNOSIS — R7303 Prediabetes: Secondary | ICD-10-CM | POA: Diagnosis not present

## 2018-07-02 DIAGNOSIS — R350 Frequency of micturition: Secondary | ICD-10-CM

## 2018-07-02 DIAGNOSIS — R6889 Other general symptoms and signs: Secondary | ICD-10-CM

## 2018-07-02 MED ORDER — PREDNISONE 20 MG PO TABS: 40.0000 mg | ORAL_TABLET | Freq: Every day | ORAL | 0 refills | Status: DC

## 2018-07-02 NOTE — Assessment & Plan Note (Signed)
Cough still persistent after 3 months, will treat as inflammatory bronchitis with prednisone course. Does not sound to be active Covid-19 and doubt prior covid-19 infection.

## 2018-07-02 NOTE — Assessment & Plan Note (Signed)
Prior pre-diabetes and checking HgA1c today.

## 2018-07-02 NOTE — Assessment & Plan Note (Signed)
Checking HGA1c and adjust as needed.

## 2018-07-02 NOTE — Assessment & Plan Note (Signed)
Checking U/A and culture.

## 2018-07-02 NOTE — Progress Notes (Signed)
Virtual Visit via Video Note  I connected with Randy Hendrix on 07/02/18 at  3:40 PM EDT by a video enabled telemedicine application and verified that I am speaking with the correct person using two identifiers.  The patient and the provider were at separate locations throughout the entire encounter.   I discussed the limitations of evaluation and management by telemedicine and the availability of in person appointments. The patient expressed understanding and agreed to proceed.  History of Present Illness: The patient is a 53 y.o. man with visit for pre-diabetes (last HgA1c about 1 year ago, some increased thirst recently, denies numbness or tingling) and cough (started back in February with flu-like symptoms and was flu negative at the time, still coughing since then, dry cough, some wheezing kind of sound family complains about, denies SOB, coughing all day and night, denies fevers or chills, temp checked daily at work) and urinary frequency (going more often at night time, no burning, some urgency, started in the last few days, denies fevers or chills).   Observations/Objective: Appearance: normal, breathing appears normal, no coughing during visit, casual grooming, abdomen does not appear distended, throat normal, memory normal, mental status is A and O times 3  Assessment and Plan: See problem oriented charting  Follow Up Instructions: rx for prednisone, checking labs, u/a and culture.   I discussed the assessment and treatment plan with the patient. The patient was provided an opportunity to ask questions and all were answered. The patient agreed with the plan and demonstrated an understanding of the instructions.   The patient was advised to call back or seek an in-person evaluation if the symptoms worsen or if the condition fails to improve as anticipated.  Randy Koch, MD

## 2018-07-03 ENCOUNTER — Emergency Department (HOSPITAL_COMMUNITY)
Admission: EM | Admit: 2018-07-03 | Discharge: 2018-07-03 | Disposition: A | Discharge status: Home / Self Care | Payer: 59 | Attending: Emergency Medicine | Admitting: Emergency Medicine

## 2018-07-03 ENCOUNTER — Encounter (HOSPITAL_COMMUNITY): Payer: Self-pay

## 2018-07-03 ENCOUNTER — Other Ambulatory Visit (INDEPENDENT_AMBULATORY_CARE_PROVIDER_SITE_OTHER): Payer: 59

## 2018-07-03 ENCOUNTER — Telehealth: Payer: Self-pay | Admitting: Family

## 2018-07-03 ENCOUNTER — Other Ambulatory Visit: Payer: Self-pay

## 2018-07-03 DIAGNOSIS — Z79899 Other long term (current) drug therapy: Secondary | ICD-10-CM | POA: Diagnosis not present

## 2018-07-03 DIAGNOSIS — R350 Frequency of micturition: Secondary | ICD-10-CM | POA: Diagnosis not present

## 2018-07-03 DIAGNOSIS — R739 Hyperglycemia, unspecified: Secondary | ICD-10-CM | POA: Insufficient documentation

## 2018-07-03 LAB — COMPREHENSIVE METABOLIC PANEL
ALT: 42 U/L (ref 0–53)
AST: 33 U/L (ref 0–37)
Albumin: 4.6 g/dL (ref 3.5–5.2)
Alkaline Phosphatase: 107 U/L (ref 39–117)
BUN: 23 mg/dL (ref 6–23)
CO2: 23 mEq/L (ref 19–32)
Calcium: 9.6 mg/dL (ref 8.4–10.5)
Chloride: 97 mEq/L (ref 96–112)
Creatinine, Ser: 1.47 mg/dL (ref 0.40–1.50)
GFR: 60.47 mL/min (ref >60.00)
Glucose, Bld: 616 mg/dL (ref 70–99)
Potassium: 4.6 mEq/L (ref 3.5–5.1)
Sodium: 135 mEq/L (ref 135–145)
Total Bilirubin: 1 mg/dL (ref 0.2–1.2)
Total Protein: 7.4 g/dL (ref 6.0–8.3)

## 2018-07-03 LAB — URINALYSIS, ROUTINE W REFLEX MICROSCOPIC
Bacteria, UA: NONE SEEN
Bilirubin Urine: NEGATIVE
Bilirubin Urine: NEGATIVE
Glucose, UA: >=500 mg/dL — AB
Ketones, ur: 40 — AB
Ketones, ur: 80 mg/dL — AB
Leukocytes,Ua: NEGATIVE
Leukocytes,Ua: NEGATIVE
Nitrite: NEGATIVE
Nitrite: NEGATIVE
Protein, ur: NEGATIVE mg/dL
Specific Gravity, Urine: 1.01 (ref 1.000–1.030)
Specific Gravity, Urine: 1.031 — ABNORMAL HIGH (ref 1.005–1.030)
Total Protein, Urine: NEGATIVE
Urine Glucose: >=1000 — AB
Urobilinogen, UA: 0.2 (ref 0.0–1.0)
WBC, UA: NONE SEEN (ref 0–?)
pH: 6 (ref 5.0–8.0)
pH: 5 (ref 5.0–8.0)

## 2018-07-03 LAB — BASIC METABOLIC PANEL
Anion gap: 13 (ref 5–15)
BUN: 24 mg/dL — ABNORMAL HIGH (ref 6–20)
CO2: 21 mmol/L — ABNORMAL LOW (ref 22–32)
Calcium: 9.6 mg/dL (ref 8.9–10.3)
Chloride: 100 mmol/L (ref 98–111)
Creatinine, Ser: 1.27 mg/dL — ABNORMAL HIGH (ref 0.61–1.24)
GFR calc Af Amer: >60 mL/min (ref >60)
GFR calc non Af Amer: >60 mL/min (ref >60)
Glucose, Bld: 475 mg/dL — ABNORMAL HIGH (ref 70–99)
Potassium: 4.3 mmol/L (ref 3.5–5.1)
Sodium: 134 mmol/L — ABNORMAL LOW (ref 135–145)

## 2018-07-03 LAB — LIPID PANEL
Cholesterol: 122 mg/dL (ref 0–200)
HDL: 27.2 mg/dL — ABNORMAL LOW (ref >39.00)
NonHDL: 94.48
Total CHOL/HDL Ratio: 4
Triglycerides: 328 mg/dL — ABNORMAL HIGH (ref 0.0–149.0)
VLDL: 65.6 mg/dL — ABNORMAL HIGH (ref 0.0–40.0)

## 2018-07-03 LAB — VITAMIN D 25 HYDROXY (VIT D DEFICIENCY, FRACTURES): VITD: 24.21 ng/mL — ABNORMAL LOW (ref 30.00–100.00)

## 2018-07-03 LAB — CBC
HCT: 37.4 % — ABNORMAL LOW (ref 39.0–52.0)
HCT: 40.3 % (ref 39.0–52.0)
Hemoglobin: 12 g/dL — ABNORMAL LOW (ref 13.0–17.0)
Hemoglobin: 12.5 g/dL — ABNORMAL LOW (ref 13.0–17.0)
MCH: 21.7 pg — ABNORMAL LOW (ref 26.0–34.0)
MCHC: 32.1 g/dL (ref 30.0–36.0)
MCHC: 31 g/dL (ref 30.0–36.0)
MCV: 68 fl — ABNORMAL LOW (ref 78.0–100.0)
MCV: 70 fL — ABNORMAL LOW (ref 80.0–100.0)
Platelets: 217 10*3/uL (ref 150.0–400.0)
Platelets: 207 10*3/uL (ref 150–400)
RBC: 5.51 Mil/uL (ref 4.22–5.81)
RBC: 5.76 MIL/uL (ref 4.22–5.81)
RDW: 17.9 % — ABNORMAL HIGH (ref 11.5–15.5)
RDW: 19.3 % — ABNORMAL HIGH (ref 11.5–15.5)
WBC: 12.5 10*3/uL — ABNORMAL HIGH (ref 4.0–10.5)
WBC: 12.6 10*3/uL — ABNORMAL HIGH (ref 4.0–10.5)
nRBC: 0.2 % (ref 0.0–0.2)

## 2018-07-03 LAB — CBG MONITORING, ED
Glucose-Capillary: 436 mg/dL — ABNORMAL HIGH (ref 70–99)
Glucose-Capillary: 449 mg/dL — ABNORMAL HIGH (ref 70–99)
Glucose-Capillary: 360 mg/dL — ABNORMAL HIGH (ref 70–99)

## 2018-07-03 LAB — LDL CHOLESTEROL, DIRECT: Direct LDL: 60 mg/dL

## 2018-07-03 LAB — HEMOGLOBIN A1C: Hgb A1c MFr Bld: 9.9 % — ABNORMAL HIGH (ref 4.6–6.5)

## 2018-07-03 MED ORDER — METFORMIN HCL 1000 MG PO TABS: 1000.0000 mg | ORAL_TABLET | Freq: Two times a day (BID) | ORAL | 0 refills | Status: DC

## 2018-07-03 MED ORDER — INSULIN ASPART 100 UNIT/ML ~~LOC~~ SOLN
8.0000 [IU] | Freq: Once | SUBCUTANEOUS | Status: AC
  Administered 2018-07-03: 8 [IU] via INTRAVENOUS
  Filled 2018-07-03: qty 1

## 2018-07-03 MED ORDER — SODIUM CHLORIDE 0.9 % IV BOLUS
1000.0000 mL | Freq: Once | INTRAVENOUS | Status: AC
  Administered 2018-07-03: 20:00:00 1000 mL via INTRAVENOUS

## 2018-07-03 NOTE — ED Provider Notes (Signed)
[place] [facility] DEPT Provider Note   CSN: [id number] Arrival date & time: 07/03/18  1737    History   Chief Complaint Chief Complaint  Patient presents with  . Hyperglycemia    HPI Randy Hendrix is a 53 y.o. male.     53 year old male with prior medical history as detailed below presents for evaluation of possible hyperglycemia.  Patient complains of recent increased thirst.  Patient's blood sugar was noted to be elevated at an outpatient laboratory.  He was sent to the ED for evaluation.  He is otherwise without complaint.  He specifically denies associated fever, chest pain, shortness of breath, cough, congestion, or other acute complaint.  The history is provided by the patient and medical records.  Hyperglycemia  Blood sugar level PTA:  600 Severity:  Moderate Onset quality:  Unable to specify Duration:  1 week Timing:  Constant Progression:  Waxing and waning Chronicity:  New Diabetes status:  Non-diabetic Relieved by:  Nothing Ineffective treatments:  None tried Associated symptoms: no abdominal pain, no fever, no nausea and no vomiting     Past Medical History:  Diagnosis Date  . Allergic rhinitis   . ALLERGIC RHINITIS   . Allergy   . Gastroenteritis   . GERD (gastroesophageal reflux disease)    occasionally  . Hemorrhoid   . Kidney stone on right side 05/2004  . OBESITY, CLASS II   . OSA (obstructive sleep apnea)    CPAP qhs  . Oxygen deficiency   . PATELLAR DISLOCATION, RIGHT   . Sleep apnea    wears cpap    Patient Active Problem List   Diagnosis Date Noted  . Urinary frequency 07/02/2018  . Pre-diabetes 07/02/2018  . Flu-like symptoms 04/07/2018  . BPPV (benign paroxysmal positional vertigo) 12/04/2017  . Lymphadenopathy 09/30/2017  . Trigger finger of left thumb 07/21/2017  . 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 Gout 12/15/2014  . Routine health maintenance 07/28/2011  . Severe obesity (BMI >= 40)  ([place]) 06/21/2008  . ALLERGIC RHINITIS 04/28/2007  . OSA (obstructive sleep apnea) 04/28/2007  . Personal history of other diseases of digestive system 04/28/2007    Past Surgical History:  Procedure Laterality Date  . HEMORRHOID SURGERY    . WISDOM TOOTH EXTRACTION          Home Medications    Prior to Admission medications   Medication Sig Start Date End Date Taking? Authorizing Provider  allopurinol (ZYLOPRIM) 100 MG tablet TAKE 1 TABLET BY MOUTH ONCE DAILY Patient taking differently: Take 100 mg by mouth daily.  03/23/18   Hoyt Koch, MD  azelastine (ASTELIN) 0.1 % nasal spray Place 2 sprays into both nostrils 2 (two) times daily as needed. 03/21/17   [provider]  Cyanocobalamin (VITAMIN B 12 PO) Take 1 tablet by mouth daily.    [provider]  Diclofenac Sodium (PENNSAID) 2 % SOLN Place 1 application onto the skin 2 (two) times daily. 02/02/18   Gerda Diss, DO  dicyclomine (BENTYL) 10 MG capsule Take 1 tablet by mouth every 6 hours as needed for cramping and diarrhea. 04/03/17   Esterwood, Amy S, PA-C  fexofenadine (ALLEGRA) 180 MG tablet Take 180 mg by mouth daily.    [provider]  Garcinia Cambogia-Chromium 500-200 MG-MCG TABS Take 1 capsule by mouth daily.    [provider]  Eloise Levels, Camillia sinensis, (GREEN TEA PO) Take 1 capsule by mouth daily.    [provider]  methylcellulose (CITRUCEL) oral powder Citrucel: use as directed once daily 12/01/17   Armbruster, Carlota Raspberry, MD  mometasone (NASONEX) 50 MCG/ACT nasal spray Place 2 sprays into the nose daily. 07/17/16   [provider]  Multiple Vitamin (MULTIVITAMIN) capsule Take 1 capsule by mouth daily.    [provider]  naproxen (NAPROSYN) 375 MG tablet Take 1 tablet (375 mg total) by mouth 2 (two) times daily. 02/18/18   Wurst, [place], PA-C  predniSONE (DELTASONE) 20 MG tablet Take 2 tablets (40 mg total) by mouth daily with breakfast.  07/02/18   Hoyt Koch, MD    Family History Family History  Problem Relation Age of Onset  . Diabetes Mother   . Anemia Mother   . Immunodeficiency Mother   . Stroke Father   . Dementia Father   . Hypertension Father   . Stomach cancer Maternal Grandmother   . Colon cancer Neg Hx   . Prostate cancer Neg Hx   . Cancer Neg Hx   . Heart disease Neg Hx   . COPD Neg Hx   . Colon polyps Neg Hx   . Esophageal cancer Neg Hx   . Rectal cancer Neg Hx     Social History Social History   Tobacco Use  . Smoking status: Never Smoker  . Smokeless tobacco: Never Used  Substance Use Topics  . Alcohol use: No    Alcohol/week: 0.0 standard drinks  . Drug use: No     Allergies   Patient has no known allergies.   Review of Systems Review of Systems  Constitutional: Negative for fever.  Gastrointestinal: Negative for abdominal pain, nausea and vomiting.  All other systems reviewed and are negative.    Physical Exam Updated Vital Signs BP 140/83   Pulse (!) 101   Temp 98.9 F (37.2 C) (Oral)   Resp 16   Ht 5\' 10"  (1.778 m)   Wt (!) 142.4 kg   SpO2 100%   BMI 45.05 kg/m   Physical Exam Vitals signs and nursing note reviewed.  Constitutional:      General: He is not in acute distress.    Appearance: Normal appearance. He is well-developed.  HENT:     Head: Normocephalic and atraumatic.  Eyes:     Conjunctiva/sclera: Conjunctivae normal.     Pupils: Pupils are equal, round, and reactive to light.  Neck:     Musculoskeletal: Normal range of motion and neck supple.  Cardiovascular:     Rate and Rhythm: Normal rate and regular rhythm.     Heart sounds: Normal heart sounds.  Pulmonary:     Effort: Pulmonary effort is normal. No respiratory distress.     Breath sounds: Normal breath sounds.  Abdominal:     General: There is no distension.     Palpations: Abdomen is soft.     Tenderness: There is no abdominal tenderness.  Musculoskeletal: Normal range of  motion.        General: No deformity.  Skin:    General: Skin is warm and dry.  Neurological:     Mental Status: He is alert and oriented to person, place, and time.      ED Treatments / Results  Labs (all labs ordered are listed, but only abnormal results are displayed) Labs Reviewed  BASIC METABOLIC PANEL - Abnormal; Notable for the following components:      Result Value   Sodium 134 (*)    CO2 21 (*)    Glucose, Bld  475 (*)    BUN 24 (*)    Creatinine, Ser 1.27 (*)    All other components within normal limits  CBC - Abnormal; Notable for the following components:   WBC 12.6 (*)    Hemoglobin 12.5 (*)    MCV 70.0 (*)    MCH 21.7 (*)    RDW 19.3 (*)    All other components within normal limits  URINALYSIS, ROUTINE W REFLEX MICROSCOPIC - Abnormal; Notable for the following components:   Color, Urine STRAW (*)    Specific Gravity, Urine 1.031 (*)    Glucose, UA >=500 (*)    Hgb urine dipstick SMALL (*)    Ketones, ur 80 (*)    All other components within normal limits  CBG MONITORING, ED - Abnormal; Notable for the following components:   Glucose-Capillary 449 (*)    All other components within normal limits  CBG MONITORING, ED - Abnormal; Notable for the following components:   Glucose-Capillary 436 (*)    All other components within normal limits  CBG MONITORING, ED - Abnormal; Notable for the following components:   Glucose-Capillary 360 (*)    All other components within normal limits    EKG None  Radiology No results found.  Procedures Procedures (including critical care time)  Medications Ordered in ED Medications  sodium chloride 0.9 % bolus 1,000 mL (0 mLs Intravenous Stopped 07/03/18 2129)  insulin aspart (novoLOG) injection 8 Units (8 Units Intravenous Given 07/03/18 1942)     Initial Impression / Assessment and Plan / ED Course  I have reviewed the triage vital signs and the nursing notes.  Pertinent labs & imaging results that were available  during my care of the patient were reviewed by me and considered in my medical decision making (see chart for details).        MDM  Screen complete  Randy Hendrix was evaluated in Emergency Department on 07/03/2018 for the symptoms described in the history of present illness. He was evaluated in the context of the global COVID-19 pandemic, which necessitated consideration that the patient might be at risk for infection with the SARS-CoV-2 virus that causes COVID-19. Institutional protocols and algorithms that pertain to the evaluation of patients at risk for COVID-19 are in a state of rapid change based on information released by regulatory bodies including the CDC and federal and state organizations. These policies and algorithms were followed during the patient's care in the ED.  Patient is presenting for evaluation of reported hyperglycemia.  Patient without prior history of significant episodes of hypoglycemia or official diagnosis of diabetes.  Screening exam and work-up does demonstrate elevated blood sugar.  There is no evidence of DKA.  Labs demonstrate moderate hyperglycemia with a normal AG (13).   Patient sugar is improved with IV fluids and a small dose of insulin.  Patient now feels improved.  Patient was offered admission. He declines same. He does understand the need for close followup with his PMD for DM education and further treatment. Will start metformin pending his re-evaluation by his PMD  Strict return precautions given and understood.  Importance of close follow-up is stressed.  Final Clinical Impressions(s) / ED Diagnoses   Final diagnoses:  Hyperglycemia    ED Discharge Orders    None       Valarie Merino, MD 07/03/18 2202

## 2018-07-03 NOTE — Telephone Encounter (Signed)
I spoke with patient regarding critical lab value; blood sugar today was at 616. He is instructed to go to the ER for further evaluation. I will make sure his PCP is aware and she will contact him on Monday for follow-up. He understands not to start the prednisone that was prescribed yesterday.

## 2018-07-03 NOTE — Discharge Instructions (Signed)
Please return for any problem.  Follow-up with your regular care provider as instructed. °

## 2018-07-03 NOTE — ED Notes (Signed)
Urine and culture collected and sent to lab.

## 2018-07-03 NOTE — ED Triage Notes (Signed)
Patient states he had blood work done at his PCP today and glucose level was 600. patient denies any N/V or abdominal pain. Patient states "I have never had high blood sugar."

## 2018-07-04 ENCOUNTER — Encounter (HOSPITAL_COMMUNITY): Payer: Self-pay | Admitting: Emergency Medicine

## 2018-07-04 ENCOUNTER — Emergency Department (HOSPITAL_COMMUNITY)
Admission: EM | Admit: 2018-07-04 | Discharge: 2018-07-04 | Disposition: A | Discharge status: Home / Self Care | Payer: 59 | Attending: Emergency Medicine | Admitting: Emergency Medicine

## 2018-07-04 ENCOUNTER — Other Ambulatory Visit: Payer: Self-pay

## 2018-07-04 ENCOUNTER — Emergency Department (HOSPITAL_COMMUNITY): Payer: 59

## 2018-07-04 DIAGNOSIS — Z79899 Other long term (current) drug therapy: Secondary | ICD-10-CM | POA: Diagnosis not present

## 2018-07-04 DIAGNOSIS — R05 Cough: Secondary | ICD-10-CM

## 2018-07-04 DIAGNOSIS — R739 Hyperglycemia, unspecified: Secondary | ICD-10-CM

## 2018-07-04 DIAGNOSIS — R059 Cough, unspecified: Secondary | ICD-10-CM

## 2018-07-04 DIAGNOSIS — Z7984 Long term (current) use of oral hypoglycemic drugs: Secondary | ICD-10-CM | POA: Insufficient documentation

## 2018-07-04 LAB — CBC
HCT: 37.8 % — ABNORMAL LOW (ref 39.0–52.0)
Hemoglobin: 11.5 g/dL — ABNORMAL LOW (ref 13.0–17.0)
MCH: 21.3 pg — ABNORMAL LOW (ref 26.0–34.0)
MCHC: 30.4 g/dL (ref 30.0–36.0)
MCV: 69.9 fL — ABNORMAL LOW (ref 80.0–100.0)
Platelets: 190 10*3/uL (ref 150–400)
RBC: 5.41 MIL/uL (ref 4.22–5.81)
RDW: 18.7 % — ABNORMAL HIGH (ref 11.5–15.5)
WBC: 11.1 10*3/uL — ABNORMAL HIGH (ref 4.0–10.5)
nRBC: 0 % (ref 0.0–0.2)

## 2018-07-04 LAB — I-STAT CHEM 8, ED
BUN: 15 mg/dL (ref 6–20)
Calcium, Ion: 1.22 mmol/L (ref 1.15–1.40)
Chloride: 105 mmol/L (ref 98–111)
Creatinine, Ser: 0.9 mg/dL (ref 0.61–1.24)
Glucose, Bld: 358 mg/dL — ABNORMAL HIGH (ref 70–99)
HCT: 35 % — ABNORMAL LOW (ref 39.0–52.0)
Hemoglobin: 11.9 g/dL — ABNORMAL LOW (ref 13.0–17.0)
Potassium: 3.8 mmol/L (ref 3.5–5.1)
Sodium: 139 mmol/L (ref 135–145)
TCO2: 23 mmol/L (ref 22–32)

## 2018-07-04 LAB — COMPREHENSIVE METABOLIC PANEL
ALT: 49 U/L — ABNORMAL HIGH (ref 0–44)
AST: 38 U/L (ref 15–41)
Albumin: 4.4 g/dL (ref 3.5–5.0)
Alkaline Phosphatase: 87 U/L (ref 38–126)
Anion gap: 12 (ref 5–15)
BUN: 20 mg/dL (ref 6–20)
CO2: 22 mmol/L (ref 22–32)
Calcium: 9.4 mg/dL (ref 8.9–10.3)
Chloride: 101 mmol/L (ref 98–111)
Creatinine, Ser: 1.1 mg/dL (ref 0.61–1.24)
GFR calc Af Amer: >60 mL/min (ref >60)
GFR calc non Af Amer: >60 mL/min (ref >60)
Glucose, Bld: 504 mg/dL (ref 70–99)
Potassium: 4.1 mmol/L (ref 3.5–5.1)
Sodium: 135 mmol/L (ref 135–145)
Total Bilirubin: 1.2 mg/dL (ref 0.3–1.2)
Total Protein: 7.4 g/dL (ref 6.5–8.1)

## 2018-07-04 LAB — URINALYSIS, ROUTINE W REFLEX MICROSCOPIC
Bacteria, UA: NONE SEEN
Bilirubin Urine: NEGATIVE
Glucose, UA: >=500 mg/dL — AB
Hgb urine dipstick: NEGATIVE
Ketones, ur: 20 mg/dL — AB
Leukocytes,Ua: NEGATIVE
Nitrite: NEGATIVE
Protein, ur: NEGATIVE mg/dL
Specific Gravity, Urine: 1.027 (ref 1.005–1.030)
pH: 5 (ref 5.0–8.0)

## 2018-07-04 LAB — URINE CULTURE
MICRO NUMBER:: 479548
Result:: NO GROWTH
SPECIMEN QUALITY:: ADEQUATE

## 2018-07-04 LAB — CBG MONITORING, ED: Glucose-Capillary: 480 mg/dL — ABNORMAL HIGH (ref 70–99)

## 2018-07-04 MED ORDER — SODIUM CHLORIDE 0.9 % IV BOLUS
1000.0000 mL | Freq: Once | INTRAVENOUS | Status: AC
  Administered 2018-07-04: 1000 mL via INTRAVENOUS

## 2018-07-04 MED ORDER — BENZONATATE 100 MG PO CAPS: 100.0000 mg | ORAL_CAPSULE | Freq: Three times a day (TID) | ORAL | 0 refills | Status: DC

## 2018-07-04 MED ORDER — INSULIN ASPART 100 UNIT/ML ~~LOC~~ SOLN
10.0000 [IU] | Freq: Once | SUBCUTANEOUS | Status: AC
  Administered 2018-07-04: 10 [IU] via INTRAVENOUS
  Filled 2018-07-04: qty 1

## 2018-07-04 MED ORDER — SODIUM CHLORIDE 0.9 % IV SOLN
INTRAVENOUS | Status: DC
  Administered 2018-07-04: 17:00:00 via INTRAVENOUS

## 2018-07-04 NOTE — Discharge Instructions (Signed)
As discussed with diabetes it is important you follow-up with your physician for consideration of its causes, and appropriate management.  Return here for concerning changes in your condition.

## 2018-07-04 NOTE — ED Triage Notes (Signed)
Pt states that he was sent here yesterday for his sugar level being elevated and got it down and sent home. Reports today was in 500s again. States that not on medications bc this is new for him.

## 2018-07-04 NOTE — ED Provider Notes (Signed)
[facility] DEPT Provider Note   CSN: [id number] Arrival date & time: 07/04/18  1554    History   Chief Complaint Chief Complaint  Patient presents with  . Hyperglycemia  . Cough    HPI Randy Hendrix is a 53 y.o. male.     HPI Patient presents with concern of hyperglycemia. Patient was seen here yesterday for similar concerns. He notes that upon arriving home, he obtained his metformin, has taken only 1 dose of this medication. He denies new nausea, vomiting, lightheadedness, has ongoing polyuria, and cough. Polyuria began maybe 2 weeks ago, cough quite a bit before that. However, is no dyspnea, no chest pain, no abdominal pain. Evaluation here, according patient was generally unremarkable, and he was prescribed metformin. He has no history of hyperglycemia. Only memorable recent event was influenza-like illness earlier this year.  Past Medical History:  Diagnosis Date  . Allergic rhinitis   . ALLERGIC RHINITIS   . Allergy   . Gastroenteritis   . GERD (gastroesophageal reflux disease)    occasionally  . Hemorrhoid   . Kidney stone on right side 05/2004  . OBESITY, CLASS II   . OSA (obstructive sleep apnea)    CPAP qhs  . Oxygen deficiency   . PATELLAR DISLOCATION, RIGHT   . Sleep apnea    wears cpap    Patient Active Problem List   Diagnosis Date Noted  . Urinary frequency 07/02/2018  . Pre-diabetes 07/02/2018  . Flu-like symptoms 04/07/2018  . BPPV (benign paroxysmal positional vertigo) 12/04/2017  . Lymphadenopathy 09/30/2017  . Trigger finger of left thumb 07/21/2017  . 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 Gout 12/15/2014  . Routine health maintenance 07/28/2011  . Severe obesity (BMI >= 40) ([place]) 06/21/2008  . ALLERGIC RHINITIS 04/28/2007  . OSA (obstructive sleep apnea) 04/28/2007  . Personal history of other diseases of digestive system 04/28/2007    Past Surgical History:  Procedure Laterality Date   . HEMORRHOID SURGERY    . WISDOM TOOTH EXTRACTION          Home Medications    Prior to Admission medications   Medication Sig Start Date End Date Taking? Authorizing Provider  allopurinol (ZYLOPRIM) 100 MG tablet TAKE 1 TABLET BY MOUTH ONCE DAILY Patient taking differently: Take 100 mg by mouth daily.  03/23/18   Hoyt Koch, MD  azelastine (ASTELIN) 0.1 % nasal spray Place 2 sprays into both nostrils 2 (two) times daily as needed. 03/21/17   [provider]  Cyanocobalamin (VITAMIN B 12 PO) Take 1 tablet by mouth daily.    [provider]  Diclofenac Sodium (PENNSAID) 2 % SOLN Place 1 application onto the skin 2 (two) times daily. 02/02/18   Gerda Diss, DO  dicyclomine (BENTYL) 10 MG capsule Take 1 tablet by mouth every 6 hours as needed for cramping and diarrhea. 04/03/17   Esterwood, Amy S, PA-C  fexofenadine (ALLEGRA) 180 MG tablet Take 180 mg by mouth daily.    [provider]  Garcinia Cambogia-Chromium 500-200 MG-MCG TABS Take 1 capsule by mouth daily.    [provider]  Eloise Levels, Camillia sinensis, (GREEN TEA PO) Take 1 capsule by mouth daily.    [provider]  metFORMIN (GLUCOPHAGE) 1000 MG tablet Take 1 tablet (1,000 mg total) by mouth 2 (two) times daily. 07/03/18   Valarie Merino, MD  methylcellulose (CITRUCEL) oral powder Citrucel: use as directed once daily 12/01/17   Armbruster, Carlota Raspberry,  MD  mometasone (NASONEX) 50 MCG/ACT nasal spray Place 2 sprays into the nose daily. 07/17/16   [provider]  Multiple Vitamin (MULTIVITAMIN) capsule Take 1 capsule by mouth daily.    [provider]  naproxen (NAPROSYN) 375 MG tablet Take 1 tablet (375 mg total) by mouth 2 (two) times daily. 02/18/18   Wurst, [place], PA-C  predniSONE (DELTASONE) 20 MG tablet Take 2 tablets (40 mg total) by mouth daily with breakfast. 07/02/18   Hoyt Koch, MD    Family History Family History  Problem Relation Age  of Onset  . Diabetes Mother   . Anemia Mother   . Immunodeficiency Mother   . Stroke Father   . Dementia Father   . Hypertension Father   . Stomach cancer Maternal Grandmother   . Colon cancer Neg Hx   . Prostate cancer Neg Hx   . Cancer Neg Hx   . Heart disease Neg Hx   . COPD Neg Hx   . Colon polyps Neg Hx   . Esophageal cancer Neg Hx   . Rectal cancer Neg Hx     Social History Social History   Tobacco Use  . Smoking status: Never Smoker  . Smokeless tobacco: Never Used  Substance Use Topics  . Alcohol use: No    Alcohol/week: 0.0 standard drinks  . Drug use: No     Allergies   Patient has no known allergies.   Review of Systems Review of Systems  Constitutional:       Per HPI, otherwise negative  HENT:       Per HPI, otherwise negative  Respiratory:       Per HPI, otherwise negative  Cardiovascular:       Per HPI, otherwise negative  Gastrointestinal: Negative for vomiting.  Endocrine: Positive for polyuria.       Negative aside from HPI  Genitourinary:       Neg aside from HPI   Musculoskeletal:       Per HPI, otherwise negative  Skin: Negative.   Neurological: Negative for syncope.     Physical Exam Updated Vital Signs BP (!) 148/87 (BP Location: Right Arm)   Pulse 99   Temp 98.4 F (36.9 C) (Oral)   Resp 16   SpO2 99%   Physical Exam Vitals signs and nursing note reviewed.  Constitutional:      General: He is not in acute distress.    Appearance: He is well-developed.  HENT:     Head: Normocephalic and atraumatic.  Eyes:     Conjunctiva/sclera: Conjunctivae normal.  Cardiovascular:     Rate and Rhythm: Normal rate and regular rhythm.  Pulmonary:     Effort: Pulmonary effort is normal. No respiratory distress.     Breath sounds: No stridor.  Abdominal:     General: There is no distension.     Tenderness: There is no abdominal tenderness.  Skin:    General: Skin is warm and dry.  Neurological:     Mental Status: He is alert and  oriented to person, place, and time.      ED Treatments / Results  Labs (all labs ordered are listed, but only abnormal results are displayed) Labs Reviewed  CBC - Abnormal; Notable for the following components:      Result Value   WBC 11.1 (*)    Hemoglobin 11.5 (*)    HCT 37.8 (*)    MCV 69.9 (*)    MCH 21.3 (*)  RDW 18.7 (*)    All other components within normal limits  URINALYSIS, ROUTINE W REFLEX MICROSCOPIC - Abnormal; Notable for the following components:   Color, Urine STRAW (*)    Glucose, UA >=500 (*)    Ketones, ur 20 (*)    All other components within normal limits  COMPREHENSIVE METABOLIC PANEL - Abnormal; Notable for the following components:   Glucose, Bld 504 (*)    ALT 49 (*)    All other components within normal limits  CBG MONITORING, ED - Abnormal; Notable for the following components:   Glucose-Capillary 480 (*)    All other components within normal limits  I-STAT CHEM 8, ED - Abnormal; Notable for the following components:   Glucose, Bld 358 (*)    Hemoglobin 11.9 (*)    HCT 35.0 (*)    All other components within normal limits  BLOOD GAS, VENOUS    Radiology Dg Chest Port 1 View  Result Date: 07/04/2018 CLINICAL DATA:  Cough EXAM: PORTABLE CHEST 1 VIEW COMPARISON:  04/12/2011 FINDINGS: The heart size and mediastinal contours are within normal limits. Both lungs are clear. The visualized skeletal structures are unremarkable. IMPRESSION: No active disease. Electronically Signed   By: Franchot Gallo M.D.   On: 07/04/2018 19:17    Procedures Procedures (including critical care time)  Medications Ordered in ED Medications  0.9 %  sodium chloride infusion ( Intravenous New Bag/Given 07/04/18 1700)  sodium chloride 0.9 % bolus 1,000 mL (0 mLs Intravenous Stopped 07/04/18 1955)  insulin aspart (novoLOG) injection 10 Units (10 Units Intravenous Given 07/04/18 1812)     Initial Impression / Assessment and Plan / ED Course  I have reviewed the triage  vital signs and the nursing notes.  Pertinent labs & imaging results that were available during my care of the patient were reviewed by me and considered in my medical decision making (see chart for details).    Labs notable for hyperglycemia, value greater than 400. With concern for hyperosmotic or DKA state, patient will have additional labs, received fluids.   Update:, After fluids, insulin, the patient's glucose level has reduced substantially, repeat chemistry panel is unremarkable.    9:15 PM 9:15 PM Gust patient's findings again with him at length, discussed possibilities for his development of diabetes, importance of following with primary care for further consideration of these possibilities. Patient will continue taking his metformin, understands return precautions, follow-up instructions. In regards to the patient's cough, no evidence for pneumonia, no hypoxia, no fever, low suspicion for occult respiratory infection, but with irritation of his cough, patient will start antitussive medication.  On he will not take his prednisone which was recently prescribed by his primary care physician.   Final Clinical Impressions(s) / ED Diagnoses  Hyperglycemia Cough   Carmin Muskrat, MD 07/04/18 2116

## 2018-07-04 NOTE — ED Triage Notes (Signed)
Pt adds that he had a cough and was supposed to get a chest xray yesterday but that didn't happen. Reports the medication he was supposed to be taking increased sugar levels so was told not to take it.

## 2018-07-04 NOTE — ED Notes (Signed)
Date and time results received: 07/04/18 5:51 PM  (use smartphrase ".now" to insert current time)  Test: glucose Critical Value: 504  Name of Provider Notified: Maddie RN   Orders Received? Or Actions Taken?:

## 2018-07-06 ENCOUNTER — Other Ambulatory Visit: Payer: Self-pay | Admitting: Internal Medicine

## 2018-07-06 ENCOUNTER — Ambulatory Visit (INDEPENDENT_AMBULATORY_CARE_PROVIDER_SITE_OTHER): Payer: 59 | Admitting: Internal Medicine

## 2018-07-06 ENCOUNTER — Encounter: Payer: Self-pay | Admitting: Internal Medicine

## 2018-07-06 DIAGNOSIS — E119 Type 2 diabetes mellitus without complications: Secondary | ICD-10-CM | POA: Diagnosis not present

## 2018-07-06 LAB — BLOOD GAS, VENOUS
Acid-base deficit: 0.5 mmol/L (ref 0.0–2.0)
Bicarbonate: 24.6 mmol/L (ref 20.0–28.0)
O2 Saturation: 77.4 %
Patient temperature: 98.6
pCO2, Ven: 44.9 mmHg (ref 44.0–60.0)
pH, Ven: 7.358 (ref 7.250–7.430)
pO2, Ven: 48 mmHg — ABNORMAL HIGH (ref 32.0–45.0)

## 2018-07-06 MED ORDER — METFORMIN HCL 1000 MG PO TABS: 1000.0000 mg | ORAL_TABLET | Freq: Two times a day (BID) | ORAL | 1 refills | Status: DC

## 2018-07-06 MED ORDER — BLOOD GLUCOSE METER KIT: PACK | 0 refills | Status: DC

## 2018-07-06 NOTE — Progress Notes (Signed)
Virtual Visit via Video Note  I connected with Randy Hendrix on 07/06/18 at  3:20 PM EDT by a video enabled telemedicine application and verified that I am speaking with the correct person using two identifiers.  The patient and the provider were at separate locations throughout the entire encounter.   I discussed the limitations of evaluation and management by telemedicine and the availability of in person appointments. The patient expressed understanding and agreed to proceed.  History of Present Illness: The patient is a 53 y.o. man with visit for ER follow up (with glucose 600 on labs Friday, sent to ER for further evaluation, given some insulin and IVF and metformin rx, started taking this and then ended up in the ER again the next day and sugar 400 and given more fluids and insulin). Started metformin on Saturday and has been taking this. Denies side effects with this. He denies major changes to diet in the last 6 months. He has lost weight in the last week or two while urinating so much, about 14 pounds. Has no headaches or chest pains. Still some mild cough. Denies SOB or diarrhea or constipation. Overall it is mildly improved. He would like to have a meter to track his sugars since finding out he is diabetic. Has tried metformin.   Observations/Objective: Appearance: normal, overweight, breathing appears normal, casual grooming, abdomen does not appear distended, throat normal, mental status is A and O times 3  Assessment and Plan: See problem oriented charting  Follow Up Instructions: refill metformin, diabetes education, rx for meter, follow up 3 months  Visit time 25 minutes: greater than 50% of that time was spent in face to face counseling and coordination of care with the patient: counseled about new diagnosis of diabetes and possible complications without good control as well as the etiology of this disease, the way his new medication metformin works, and diet changes and  exercise recommended to help also, as well as recommended sugar levels different times of day.  I discussed the assessment and treatment plan with the patient. The patient was provided an opportunity to ask questions and all were answered. The patient agreed with the plan and demonstrated an understanding of the instructions.   The patient was advised to call back or seek an in-person evaluation if the symptoms worsen or if the condition fails to improve as anticipated.  Hoyt Koch, MD

## 2018-07-07 DIAGNOSIS — E119 Type 2 diabetes mellitus without complications: Secondary | ICD-10-CM | POA: Insufficient documentation

## 2018-07-07 NOTE — Assessment & Plan Note (Signed)
He is taking metformin 1000 mg BID and refilled today. Needs visit in about 3 months for recheck HgA1c. Rx for meter today to monitor glucose and we talked about normal fasting sugar levels. Referral to diabetes education for dietary modification. We did discuss diet also during the visit. Will discuss more next visit about other treatments which are recommended for diabetes including statin and ACE-I/ARB.

## 2018-07-21 ENCOUNTER — Telehealth: Payer: Self-pay

## 2018-07-21 NOTE — Telephone Encounter (Signed)
Called patient back and informed provider was gone for the day and that the symptoms sounded very concerning and I would suggest ED or urgent care at the very least especially if things get worse. Patient stated understanding. I informed patient that I would still send message to provider

## 2018-07-21 NOTE — Telephone Encounter (Signed)
Copied from [place] [id number]. Topic: General - Other >> Jul 21, 2018  3:34 PM Virl Axe D wrote: Reason for CRM: Pt's wife stated that pt's blood sugar yesterday was 465 and his BP 179/101 Pulse 101. The lowest pt's sugar has been is 395. They have not taken pt's blood sugar today and pt was not with his wife during call. She stated pt has been lethargic and tasting metal. She wonders if that is from the metFormin. She would like a call back with what Dr. Sharlet Salina would like to do. [phone] or can call pt as she stated he should be off work [phone]

## 2018-07-22 NOTE — Telephone Encounter (Signed)
Patient's wife informed of below. She states he is at work today. He told her he thought his sxs were from being overheated.  I advised her if his sxs return or new sxs develop to go to ER and to remind him to take cool down breaks and stay hydrated. She verbalized understanding.   She wants to know if patient needs any medication adjustments for elevated BS and BP. Please advise.

## 2018-07-22 NOTE — Telephone Encounter (Signed)
Okay, will wait to hear

## 2018-07-22 NOTE — Telephone Encounter (Signed)
Given the lethargic he should go to ER

## 2018-07-22 NOTE — Telephone Encounter (Signed)
I called pt's wife- he is at work today and has not checked CBG yet. He is off work at [employer]. She will check his CBG then and report back numbers. She states when she spoke to pt this morning while he was working, he stated he "feels fine" today.

## 2018-07-22 NOTE — Telephone Encounter (Signed)
What are they running today? Also any symptoms with them

## 2018-07-24 MED ORDER — LISINOPRIL 20 MG PO TABS: 20.0000 mg | ORAL_TABLET | Freq: Every day | ORAL | 3 refills | Status: DC

## 2018-07-24 MED ORDER — EMPAGLIFLOZIN 25 MG PO TABS: 25.0000 mg | ORAL_TABLET | Freq: Every day | ORAL | 3 refills | Status: DC

## 2018-07-24 NOTE — Telephone Encounter (Signed)
Patient informed meds were sent and once starting them to make a visit for 3-4 weeks

## 2018-07-24 NOTE — Telephone Encounter (Signed)
Received a typed letter from patient in box this morning. States that BP on 6/3 was 155/99, pulse 104, BS before meal 395. Sugar level on 6/4 was 375 at 6am, states that is the lowest it has been in 4 days. 6/4 at 4pm sugars at 495. Letter did not state any symptoms he is or is not having

## 2018-07-24 NOTE — Telephone Encounter (Signed)
We can add a medicine as long as he is taking metformin BID for at least 2 weeks to help with sugars. Would recommend jardiance which is 1 pill daily and sent in. Can start lisinopril 20 mg daily for BP which I have sent in also. Needs visit in 3-4 weeks after starting for assessment and labs.

## 2018-07-24 NOTE — Addendum Note (Signed)
Addended by: Pricilla Holm A on: 07/24/2018 09:43 AM   Modules accepted: Orders

## 2018-07-30 ENCOUNTER — Other Ambulatory Visit: Payer: Self-pay | Admitting: Internal Medicine

## 2018-07-31 ENCOUNTER — Other Ambulatory Visit: Payer: Self-pay | Admitting: Internal Medicine

## 2018-08-04 ENCOUNTER — Ambulatory Visit: Payer: 59 | Admitting: Podiatry

## 2018-08-11 ENCOUNTER — Ambulatory Visit: Payer: 59 | Admitting: Dietician

## 2018-08-31 ENCOUNTER — Other Ambulatory Visit: Payer: Self-pay

## 2018-08-31 MED ORDER — AZELASTINE HCL 0.1 % NA SOLN: 2.0000 | Freq: Two times a day (BID) | NASAL | 0 refills | Status: DC | PRN

## 2018-09-03 ENCOUNTER — Ambulatory Visit: Payer: 59 | Admitting: Dietician

## 2018-09-10 ENCOUNTER — Encounter: Payer: Self-pay | Admitting: Registered"

## 2018-09-10 ENCOUNTER — Other Ambulatory Visit: Payer: Self-pay

## 2018-09-10 ENCOUNTER — Encounter: Payer: 59 | Attending: Internal Medicine | Admitting: Registered"

## 2018-09-10 DIAGNOSIS — E119 Type 2 diabetes mellitus without complications: Secondary | ICD-10-CM

## 2018-09-10 NOTE — Patient Instructions (Addendum)
Continue taking Vitamin D, 2,000 u. Aim to eat balanced meal and snacks Continue to get exercise 3x week, consider working up to 5x Continue to stay active If you are curious how a meal has affected your blood sugar, check 2 hrs after first bite of meal.

## 2018-09-10 NOTE — Progress Notes (Signed)
Diabetes Self-Management Education  Visit Type: First/Initial  Appt. Start Time: 0800 Appt. End Time: 3335  09/10/2018  [name], identified by name and date of birth, is a 53 y.o. male with a diagnosis of Diabetes: Type 2.   ASSESSMENT  There were no vitals taken for this visit. There is no height or weight on file to calculate BMI.  Pt states he was surprised by his recent diagnosis because for years he has been told her BG was fine. He didn't know what the number has been. Per notes 5/18 encounter his glucose was 600 mg/dL and A1c 9.9% (vit D 24.21). Pt states he felt something was wrong when he had 25-30 lbs fast weight loss, still losing but steady with about 45 lb total weight loss from original weight. Pt states he has started eating smaller portion sizes (not as hungry) and making an effort to eat at more regular times.  Per patient reported FBG, A1c is likely 7% or less now.  Previously patient states his eating pattern was more erratic, sometimes skipped meals if busy doing something and then would eat a larger meal to make up for skipped meal.  Pt state several years back he had food poisoning bout which he said he was told messed up his intestinal flora and he started taking IB guard. However, when he started metformin was experiencing diarrhea, so stopped taking IB Guard. Pt states he eats yoplait yogurt 1-2x week yogurt.  Pt states both he and his wife share cooking responsibilities, but she does the shopping.  Pt reports work schedule 2p - 12a. Goes to sleep around 1 am - 8 am ~7 hrs feels like it is enough with getting extra sleep on days off by going to bed a little earlier and/or sleeping in longer.Generally keeps same sleep/wake cycle on days off.  Reported changes since diagnosis include reduced starches, smaller portions, is exercing more consistently.  Pt has been drinking sugar-free soda for awhile, motivated by less calories and thought it would be  better for him than sugar drinks. Pt states he asked doctor about it and said he was told sugar-free soda was better than diet soda. RD discussed sweeteners and sugar-free vs diet in labeling (not necessarily different).  Diabetes Self-Management Education - 09/10/18 0816      Visit Information   Visit Type  First/Initial      Initial Visit   Diabetes Type  Type 2    Are you currently following a meal plan?  No    Are you taking your medications as prescribed?  Yes   Jardiance, metformin   Date Diagnosed  06/2018      Health Coping   How would you rate your overall health?  Good      Psychosocial Assessment   Patient Belief/Attitude about Diabetes  Motivated to manage diabetes    How often do you need to have someone help you when you read instructions, pamphlets, or other written materials from your doctor or pharmacy?  1 - Never    What is the last grade level you completed in school?  12      Complications   Last HgB A1C per patient/outside source  9.9 %    How often do you check your blood sugar?  1-2 times/day    Fasting Blood glucose range (mg/dL)  70-129   107-120   Number of hypoglycemic episodes per month  0    Number of hyperglycemic episodes per week  0    Have you had a dilated eye exam in the past 12 months?  Yes    Have you had a dental exam in the past 12 months?  Yes    Are you checking your feet?  Yes    How many days per week are you checking your feet?  2      Dietary Intake   Breakfast  8 am meat, veggies    Snack (morning)  almonds OR fruit OR PB crackers    Lunch  6-7 pm at work salad or sandwich    Snack (afternoon)  12:30 am yogurt OR fruit (to take med when gets home from work)    Soil scientist)  water, 1 diet soft drink/day, occassionally gatorade zero or juice      Exercise   Exercise Type  Light (walking / raking leaves)    How many days per week to you exercise?  3    How many minutes per day do you exercise?  20    Total minutes per week of  exercise  60      Patient Education   Previous Diabetes Education  No    Disease state   Definition of diabetes, type 1 and 2, and the diagnosis of diabetes;Factors that contribute to the development of diabetes    Nutrition management   Role of diet in the treatment of diabetes and the relationship between the three main macronutrients and blood glucose level    Physical activity and exercise   Role of exercise on diabetes management, blood pressure control and cardiac health.    Medications  Reviewed patients medication for diabetes, action, purpose, timing of dose and side effects.    Monitoring  Identified appropriate SMBG and/or A1C goals.    Chronic complications  Relationship between chronic complications and blood glucose control      Individualized Goals (developed by patient)   Nutrition  General guidelines for healthy choices and portions discussed    Physical Activity  Exercise 3-5 times per week    Medications  take my medication as prescribed    Monitoring   test my blood glucose as discussed      Outcomes   Expected Outcomes  Demonstrated interest in learning. Expect positive outcomes    Future DMSE  PRN    Program Status  Completed       Individualized Plan for Diabetes Self-Management Training:   Learning Objective:  Patient will have a greater understanding of diabetes self-management. Patient education plan is to attend individual and/or group sessions per assessed needs and concerns.   Plan:   Patient Instructions  Continue taking Vitamin D, 2,000 u. Aim to eat balanced meal and snacks Continue to get exercise 3x week, consider working up to 5x Continue to stay active If you are curious how a meal has affected your blood sugar, check 2 hrs after first bite of meal.   Expected Outcomes:  Demonstrated interest in learning. Expect positive outcomes  Education material provided: ADA - How to Thrive: A Guide for Your Journey with Diabetes, A1C conversion sheet  and Carbohydrate counting sheet  If problems or questions, patient to contact team via:  Phone and MyChart  Future DSME appointment: PRN

## 2018-09-12 ENCOUNTER — Other Ambulatory Visit: Payer: Self-pay | Admitting: Internal Medicine

## 2018-09-12 DIAGNOSIS — Z20822 Contact with and (suspected) exposure to covid-19: Secondary | ICD-10-CM

## 2018-09-15 LAB — NOVEL CORONAVIRUS, NAA: SARS-CoV-2, NAA: NOT DETECTED

## 2018-09-16 ENCOUNTER — Other Ambulatory Visit: Payer: Self-pay | Admitting: Internal Medicine

## 2018-09-22 ENCOUNTER — Other Ambulatory Visit: Payer: Self-pay

## 2018-09-22 MED ORDER — ACCU-CHEK FASTCLIX LANCETS MISC: 1 refills | Status: DC

## 2018-09-23 ENCOUNTER — Other Ambulatory Visit: Payer: Self-pay

## 2018-09-23 MED ORDER — ACCU-CHEK GUIDE VI STRP: ORAL_STRIP | 2 refills | Status: DC

## 2018-09-23 MED ORDER — AZELASTINE HCL 0.1 % NA SOLN: 2.0000 | Freq: Two times a day (BID) | NASAL | 0 refills | Status: DC | PRN

## 2018-09-23 MED ORDER — ALLOPURINOL 100 MG PO TABS: 100.0000 mg | ORAL_TABLET | Freq: Every day | ORAL | 1 refills | Status: DC

## 2018-09-23 MED ORDER — METFORMIN HCL 1000 MG PO TABS: 1000.0000 mg | ORAL_TABLET | Freq: Two times a day (BID) | ORAL | 1 refills | Status: DC

## 2018-09-23 MED ORDER — JARDIANCE 25 MG PO TABS: 25.0000 mg | ORAL_TABLET | Freq: Every day | ORAL | 1 refills | Status: DC

## 2018-09-23 MED ORDER — LISINOPRIL 20 MG PO TABS: 20.0000 mg | ORAL_TABLET | Freq: Every day | ORAL | 1 refills | Status: DC

## 2018-09-24 ENCOUNTER — Telehealth: Payer: Self-pay | Admitting: Internal Medicine

## 2018-09-24 NOTE — Telephone Encounter (Signed)
Copied from [place] [phone] [phone]. Topic: Quick Communication - Rx Refill/Question >> Sep 24, 2018  2:41 PM Nils Flack, Marland Kitchen wrote: Medication: needs new rx for meter that is covered by insurance  Has the patient contacted their pharmacy? Yes.   (Agent: If no, request that the patient contact the pharmacy for the refill.) (Agent: If yes, when and what did the pharmacy advise?)  Preferred Pharmacy (with phone number or street name): [facility] [phone] reference #[id number]   Agent: Please be advised that RX refills may take up to 3 business days. We ask that you follow-up with your pharmacy.

## 2018-09-25 MED ORDER — BLOOD GLUCOSE METER KIT: PACK | 0 refills | Status: DC

## 2018-09-25 NOTE — Telephone Encounter (Signed)
Sent in

## 2018-09-30 ENCOUNTER — Other Ambulatory Visit: Payer: Self-pay

## 2018-09-30 MED ORDER — LANCETS MISC: 1 refills | Status: DC

## 2018-10-02 MED ORDER — ONETOUCH ULTRA 2 W/DEVICE KIT: PACK | 0 refills | Status: DC

## 2018-10-02 NOTE — Telephone Encounter (Signed)
[facility] calling to request the  blood glucose meter kit and supplies Be sent to them.  Pt needs one touch ultra 2  They got ok to change the lancets and teat strips, but failed to mention pt needs meter.  [place], [place] [employer] [phone] (Phone) [phone] (Fax)

## 2018-10-02 NOTE — Addendum Note (Signed)
Addended by: Pollyann Glen on: 10/02/2018 10:47 AM   Modules accepted: Orders

## 2018-10-02 NOTE — Telephone Encounter (Signed)
Sent!

## 2018-11-24 ENCOUNTER — Other Ambulatory Visit: Payer: Self-pay | Admitting: Internal Medicine

## 2018-12-07 ENCOUNTER — Ambulatory Visit (INDEPENDENT_AMBULATORY_CARE_PROVIDER_SITE_OTHER): Payer: 59

## 2018-12-07 ENCOUNTER — Other Ambulatory Visit: Payer: Self-pay

## 2018-12-07 DIAGNOSIS — Z23 Encounter for immunization: Secondary | ICD-10-CM

## 2019-01-07 ENCOUNTER — Telehealth: Payer: Self-pay

## 2019-01-07 MED ORDER — LOSARTAN POTASSIUM 50 MG PO TABS: 50.0000 mg | ORAL_TABLET | Freq: Every day | ORAL | 3 refills | Status: DC

## 2019-01-07 NOTE — Telephone Encounter (Signed)
Patient's wife informed this was sent in

## 2019-01-07 NOTE — Telephone Encounter (Signed)
Sent in losartan instead.

## 2019-01-07 NOTE — Addendum Note (Signed)
Addended by: Pricilla Holm A on: 01/07/2019 02:48 PM   Modules accepted: Orders

## 2019-01-07 NOTE — Telephone Encounter (Signed)
Copied from [place] [phone]. Topic: General - Other >> Jan 07, 2019 12:53 PM Leward Quan A wrote: Reason for CRM: Patient wife called to say that his BP medication need to be changed from lisinopril (ZESTRIL) 20 MG tablet to Losartan due to increased coughing. Asking if Rx  can it be sent to the pharmacy please Ph# [phone]

## 2019-01-11 ENCOUNTER — Other Ambulatory Visit: Payer: Self-pay

## 2019-01-11 DIAGNOSIS — Z20822 Contact with and (suspected) exposure to covid-19: Secondary | ICD-10-CM

## 2019-01-12 LAB — NOVEL CORONAVIRUS, NAA: SARS-CoV-2, NAA: NOT DETECTED

## 2019-02-03 ENCOUNTER — Other Ambulatory Visit: Payer: Self-pay

## 2019-02-04 ENCOUNTER — Ambulatory Visit: Payer: Self-pay

## 2019-02-04 ENCOUNTER — Ambulatory Visit (INDEPENDENT_AMBULATORY_CARE_PROVIDER_SITE_OTHER): Payer: 59 | Admitting: Family Medicine

## 2019-02-04 ENCOUNTER — Encounter: Payer: Self-pay | Admitting: Family Medicine

## 2019-02-04 VITALS — BP 120/78 | HR 87 | Ht 70.0 in | Wt 311.6 lb

## 2019-02-04 DIAGNOSIS — M79645 Pain in left finger(s): Secondary | ICD-10-CM | POA: Diagnosis not present

## 2019-02-04 NOTE — Progress Notes (Signed)
I, Randy Hendrix, LAT, ATC, am serving as scribe for Dr. Lynne Hendrix.  Randy Hendrix is a 53 y.o. male who presents to [place] today for f/u of L thumb pain.  He was last seen by Dr. Paulla Hendrix on 02/02/18 and was prescribed Pennsaid.  Prior to that he had a L thumb injection on 12/09/17 and was advised to wear a short thumb spica brace on his L thumb.  He had a L hand XR on 07/21/17.  Since the pt's last visit, he reports that he is having pain to touch/pressure at the ulnar side of interphalangeal joint.  He states that he is not experiencing locking and catching as he was previously.  He locates his symptoms to his L thumb DIP.  He con't to use the Pennsaid daily.  He is wearing a hand/thumb brace which he has been wearing consistently at work and while sleeping.  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  He is right-hand dominant.    ROS:  As above  Exam:  BP 120/78 (BP Location: Left Arm, Patient Position: Sitting, Cuff Size: Large)   Pulse 87   Ht _0  (1.778 m)   Wt (!) 311 lb 9.6 oz (141.3 kg)   SpO2 94%   BMI 44.71 kg/m  Wt Readings from Last 5 Encounters:  02/04/19 (!) 311 lb 9.6 oz (141.3 kg)  07/03/18 (!) 314 lb (142.4 kg)  05/01/18 (!) 327 lb 4.8 oz (148.5 kg)  04/07/18 (!) 326 lb (147.9 kg)  03/10/18 (!) 327 lb 9.6 oz (148.6 kg)   General: Well Developed, well nourished, and in no acute distress.  Neuro/Psych: Alert and oriented x3, extra-ocular muscles intact, able to move all 4 extremities, sensation grossly intact. Skin: Warm and dry, no rashes noted.  Respiratory: Not using accessory muscles, speaking in full sentences, trachea midline.  Cardiovascular: Pulses palpable, no extremity edema. Abdomen: Does not appear distended. MSK:  Left hand normal-appearing no deformity or swelling. Normal motion no triggering. Mildly tender to palpation at radial side of thumb interphalangeal  joint.  Nontender elsewhere in the hand and thumb. Stable ligamentous exam to radial and ulnar stress. Normal thumb strength to flexion extension abduction adduction and opposition. No triggering. Pulses cap refill and sensation are intact distally.   Lab and Radiology Results Limited musculoskeletal ultrasound left thumb IP joint No effusion normal-appearing to evaluation of dorsal volar radial and ulnar side. Area of tenderness at ulnar side of joint visualized.  Area of tenderness is the distal end of the proximal phalanx.  Normal appearance of the bone in this area.  The digital nerve crosses near this area seen on ultrasound. Intact radial and ulnar collateral ligaments. Intact flexion and extension tendons. Impression: Contusion or bruising of ulnar IP joint with irritation of digital nerve at this area.     Assessment and Plan: 53 y.o. male with  Left thumb pain.  Patient has a prior history of left trigger thumb that does not seem to be the main problem today.  The main issue is tenderness at the ulnar side of the IP joint left thumb.  The short thumb spica brace that he does have does not extend across the interphalangeal joint and I believe he is getting excessive pressure at the ulnar side of the interphalangeal joint due to his occupation and positioning of brace.  We had a lengthy discussion of treatment plan and options.  Plan to continue Pennsaid ointment.  We will transition to double Band-Aid splint however will cut a offloading doughnut and apply that overlying the area of tenderness at ulnar side of left thumb IP joint.  This should help prevent triggering at MCP which still is a back burner issue as well as offload pressure in this area.  If not improving patient will let me know.  Next step would also include referral to occupational hand therapy and possibly injection.  PDMP not reviewed this encounter. Orders Placed This Encounter  Procedures  . NO CHG - [place] UPPER  LEFT    Order Specific Question:   Reason for Exam (SYMPTOM  OR DIAGNOSIS REQUIRED)    Answer:   L thumb pain    Order Specific Question:   Preferred imaging location?    Answer:   [facility]   No orders of the defined types were placed in this encounter.   Historical information moved to improve visibility of documentation.  Past Medical History:  Diagnosis Date  . Allergic rhinitis   . ALLERGIC RHINITIS   . Allergy   . Gastroenteritis   . GERD (gastroesophageal reflux disease)    occasionally  . Gout   . Hemorrhoid   . Kidney stone on right side 05/2004  . OBESITY, CLASS II   . OSA (obstructive sleep apnea)    CPAP qhs  . Oxygen deficiency   . PATELLAR DISLOCATION, RIGHT   . Sleep apnea    wears cpap   Past Surgical History:  Procedure Laterality Date  . HEMORRHOID SURGERY    . WISDOM TOOTH EXTRACTION     Social History   Tobacco Use  . Smoking status: Never Smoker  . Smokeless tobacco: Never Used  Substance Use Topics  . Alcohol use: No    Alcohol/week: 0.0 standard drinks   family history includes Anemia in his mother; Dementia in his father; Diabetes in his mother; Hypertension in his father; Immunodeficiency in his mother; Stomach cancer in his maternal grandmother; Stroke in his father.  Medications: Current Outpatient Medications  Medication Sig Dispense Refill  . allopurinol (ZYLOPRIM) 100 MG tablet Take 1 tablet (100 mg total) by mouth daily. 90 tablet 1  . azelastine (ASTELIN) 0.1 % nasal spray Place 2 sprays into both nostrils 2 (two) times daily as needed. 30 mL 0  . benzonatate (TESSALON) 100 MG capsule Take 1 capsule (100 mg total) by mouth every 8 (eight) hours. 21 capsule 0  . blood glucose meter kit and supplies Dispense based on patient and insurance preference. Use up to four times daily as directed. (FOR ICD-10 E10.9, E11.9). 1 each 0  . Blood Glucose Monitoring Suppl (ONE TOUCH ULTRA 2) w/Device KIT Used to check blood sugars four  times daily as needed 1 kit 0  . Cyanocobalamin (VITAMIN B 12 PO) Take 1 tablet by mouth daily.    . Diclofenac Sodium (PENNSAID) 2 % SOLN Place 1 application onto the skin 2 (two) times daily. 112 g 2  . dicyclomine (BENTYL) 10 MG capsule Take 1 tablet by mouth every 6 hours as needed for cramping and diarrhea. 90 capsule 0  . empagliflozin (JARDIANCE) 25 MG TABS tablet Take 25 mg by mouth daily. 90 tablet 1  . fexofenadine (ALLEGRA) 180 MG tablet Take 180 mg by mouth daily.    . Garcinia Cambogia-Chromium 500-200 MG-MCG TABS Take 1 capsule by mouth daily.    Marland Kitchen glucose blood (ACCU-CHEK GUIDE) test strip TEST FOUR TIMES  DAILY AS DIRECTED 100 strip 2  . Green Tea, Camillia sinensis, (GREEN TEA PO) Take 1 capsule by mouth daily.    . Lancets (ONETOUCH DELICA PLUS TRRNHA57X) MISC USE TO CHECK SUGARS 4 TIMES DAILY 400 each 3  . losartan (COZAAR) 50 MG tablet Take 1 tablet (50 mg total) by mouth daily. 90 tablet 3  . metFORMIN (GLUCOPHAGE) 1000 MG tablet Take 1 tablet (1,000 mg total) by mouth 2 (two) times daily. 180 tablet 1  . methylcellulose (CITRUCEL) oral powder Citrucel: use as directed once daily    . mometasone (NASONEX) 50 MCG/ACT nasal spray Place 2 sprays into the nose daily.  1  . Multiple Vitamin (MULTIVITAMIN) capsule Take 1 capsule by mouth daily.    . naproxen (NAPROSYN) 375 MG tablet Take 1 tablet (375 mg total) by mouth 2 (two) times daily. 20 tablet 0  . VITAMIN D PO Take by mouth.     No current facility-administered medications for this visit.   No Known Allergies    Discussed warning signs or symptoms. Please see discharge instructions. Patient expresses understanding.  The above documentation has been reviewed and is accurate and complete Randy Hendrix

## 2019-02-04 NOTE — Patient Instructions (Signed)
Thank you for coming in today. I think you do have some trigger thumb.  I also think the little bony prominence of the inside part of the thumb is getting too much pressure.  Try using the double bandaid splint with a pressure offloading pad or doughnut.  Stop for a while the thumb splint.  Continue pensaid or over the counter voltaren gel.  If you need me to refill pensaid let me know.   We're moving!  Dr. Clovis Riley new office will be located at [address] on the 1st floor.  This location is across the street from the [employer] and in the same complex as the [facility] and [employer]  Our new office phone number will be [phone].  We anticipate beginning to see patients at the [facility] office February 08 2019.

## 2019-02-24 ENCOUNTER — Ambulatory Visit: Payer: 59 | Attending: Internal Medicine

## 2019-02-24 DIAGNOSIS — Z20822 Contact with and (suspected) exposure to covid-19: Secondary | ICD-10-CM

## 2019-02-26 LAB — NOVEL CORONAVIRUS, NAA: SARS-CoV-2, NAA: NOT DETECTED

## 2019-03-10 ENCOUNTER — Other Ambulatory Visit: Payer: Self-pay | Admitting: Internal Medicine

## 2019-03-17 IMAGING — DX DG HAND COMPLETE 3+V*L*
3 series · 3 of 3 positions shown · non-contrast
Comparison: None.

CLINICAL DATA: LEFT thumb pain following dislocation 2 weeks ago.

EXAM:
LEFT HAND - COMPLETE 3+ VIEW

[hand pa]
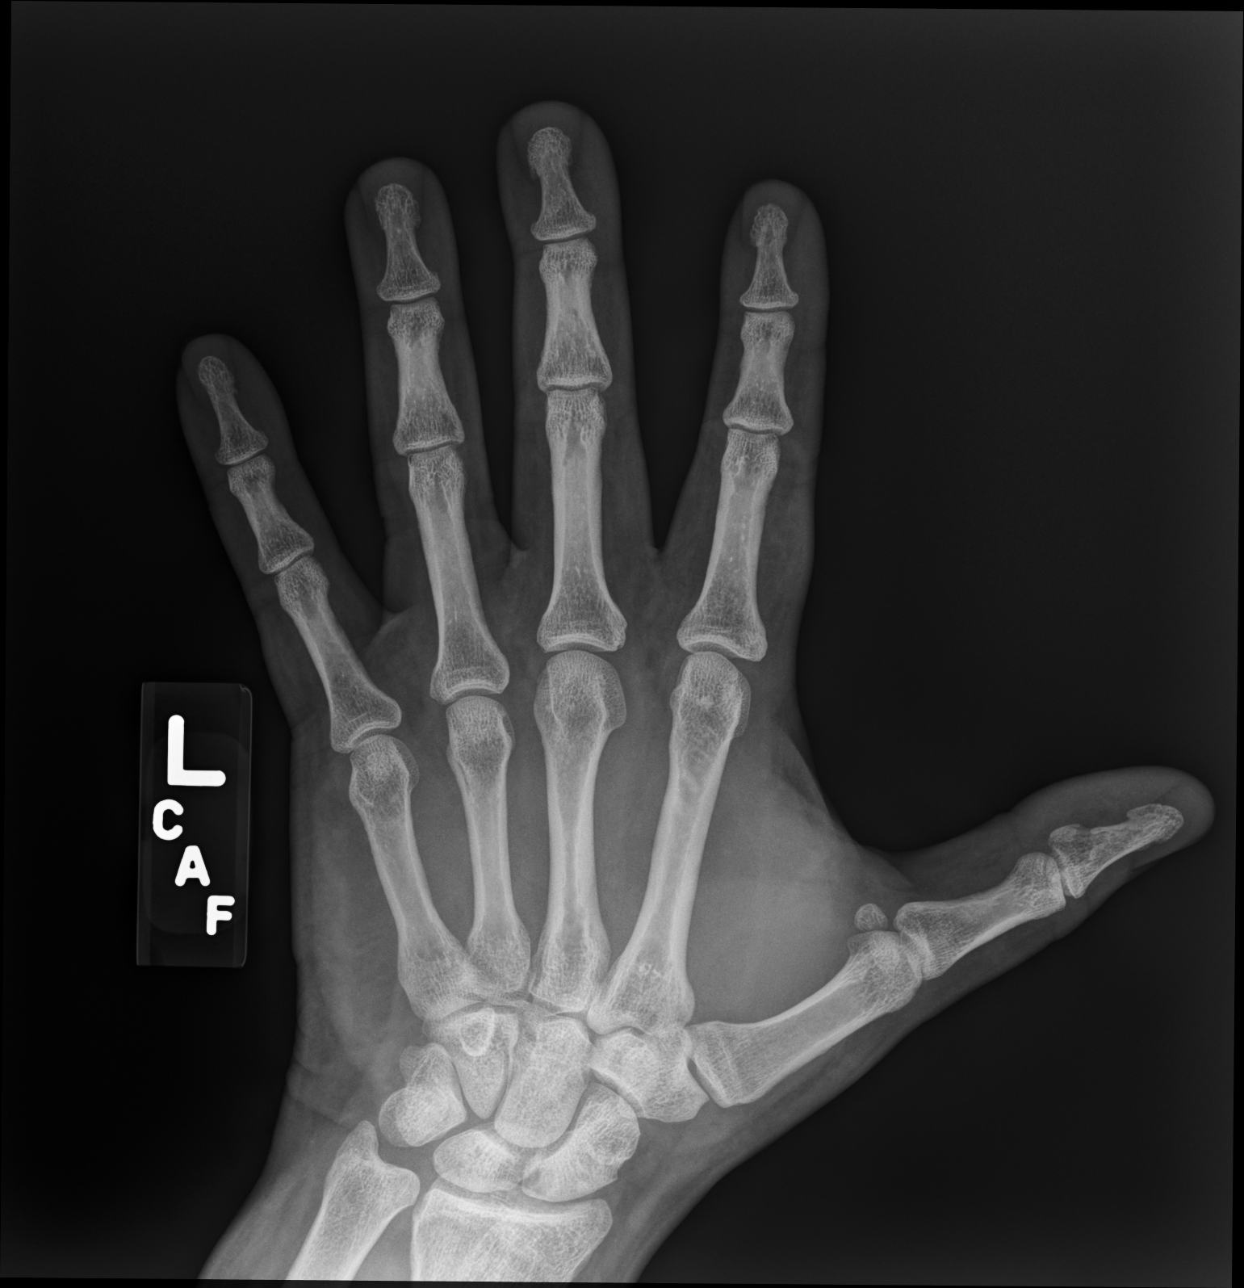

[hand oblique]
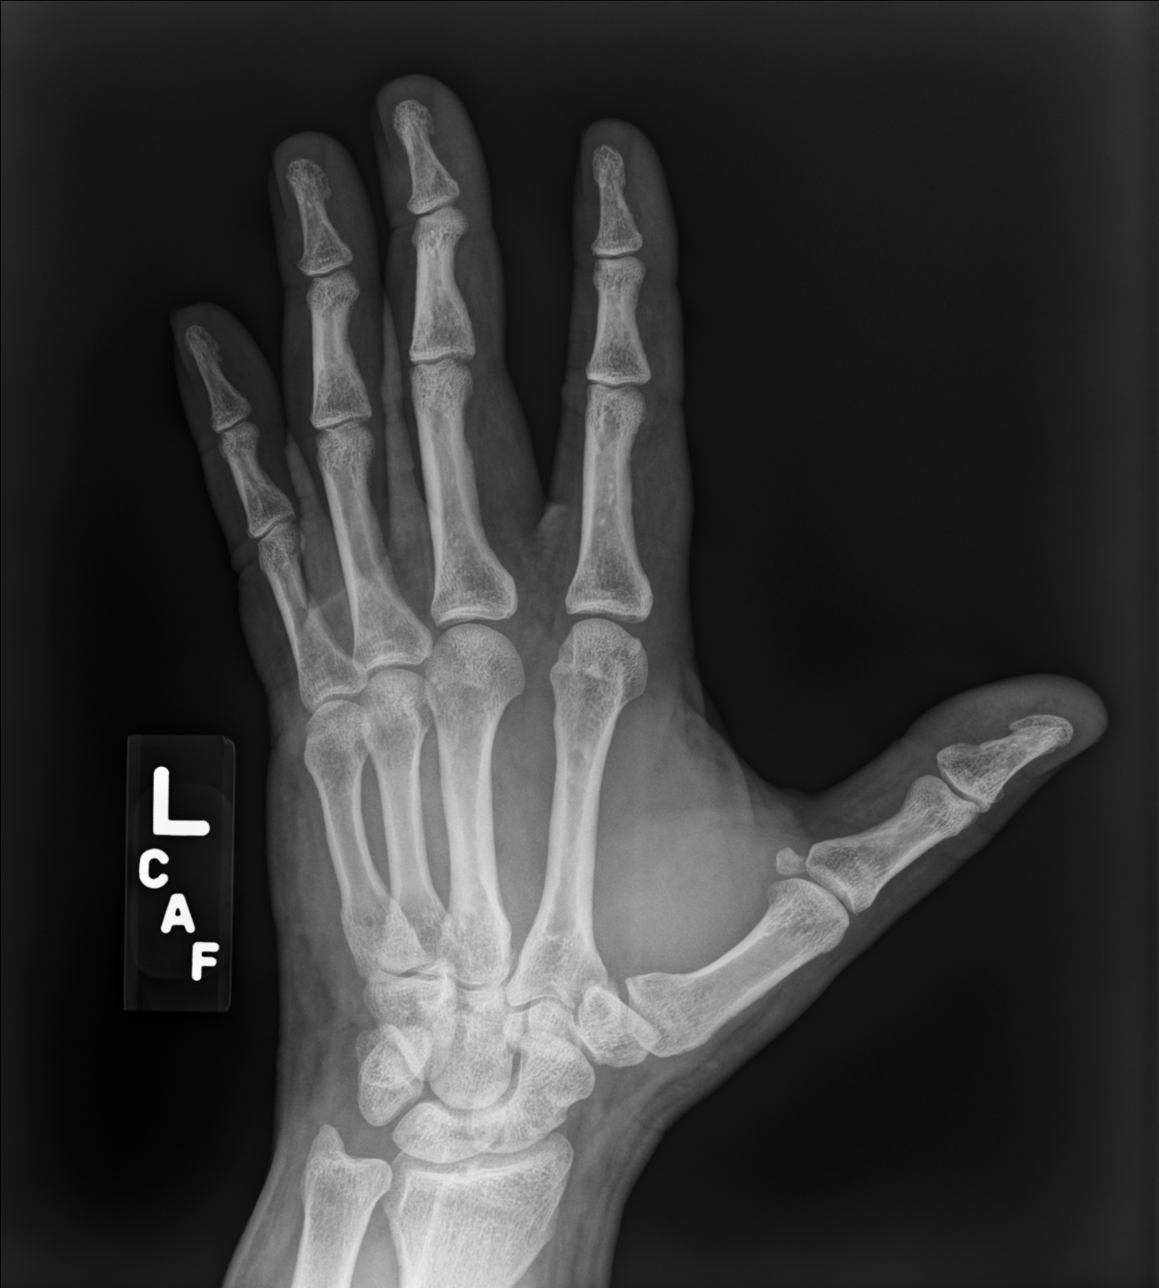

[hand lat]
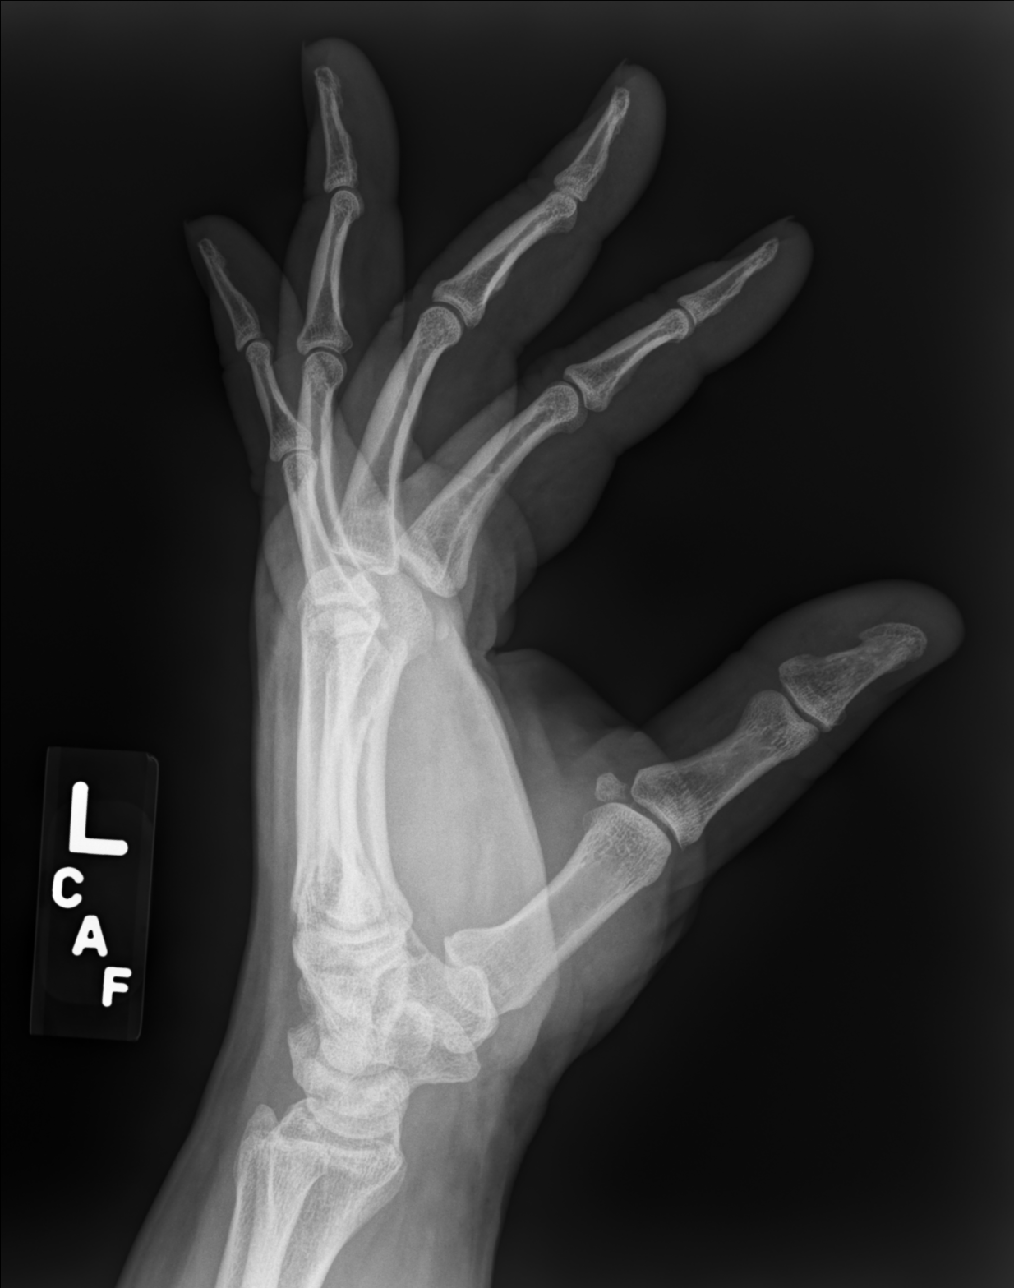

[3 of 3 positions shown; findings below may reference images not displayed]

FINDINGS: There is no evidence of fracture or dislocation. There is no
evidence of arthropathy or other focal bone abnormality. Soft
tissues are unremarkable.
IMPRESSION: Negative.

## 2019-04-26 ENCOUNTER — Other Ambulatory Visit: Payer: Self-pay

## 2019-04-26 ENCOUNTER — Encounter: Payer: Self-pay | Admitting: Podiatry

## 2019-04-26 ENCOUNTER — Ambulatory Visit (INDEPENDENT_AMBULATORY_CARE_PROVIDER_SITE_OTHER): Payer: 59 | Admitting: Podiatry

## 2019-04-26 VITALS — Temp 97.6°F

## 2019-04-26 DIAGNOSIS — M2141 Flat foot [pes planus] (acquired), right foot: Secondary | ICD-10-CM

## 2019-04-26 DIAGNOSIS — L600 Ingrowing nail: Secondary | ICD-10-CM | POA: Diagnosis not present

## 2019-04-26 DIAGNOSIS — M2142 Flat foot [pes planus] (acquired), left foot: Secondary | ICD-10-CM | POA: Diagnosis not present

## 2019-04-26 DIAGNOSIS — E119 Type 2 diabetes mellitus without complications: Secondary | ICD-10-CM

## 2019-04-26 DIAGNOSIS — L603 Nail dystrophy: Secondary | ICD-10-CM

## 2019-04-26 NOTE — Patient Instructions (Signed)
Diabetes Mellitus and Foot Care Foot care is an important part of your health, especially when you have diabetes. Diabetes may cause you to have problems because of poor blood flow (circulation) to your feet and legs, which can cause your skin to:  Become thinner and drier.  Break more easily.  Heal more slowly.  Peel and crack. You may also have nerve damage (neuropathy) in your legs and feet, causing decreased feeling in them. This means that you may not notice minor injuries to your feet that could lead to more serious problems. Noticing and addressing any potential problems early is the best way to prevent future foot problems. How to care for your feet Foot hygiene  Wash your feet daily with warm water and mild soap. Do not use hot water. Then, pat your feet and the areas between your toes until they are completely dry. Do not soak your feet as this can dry your skin.  Trim your toenails straight across. Do not dig under them or around the cuticle. File the edges of your nails with an emery board or nail file.  Apply a moisturizing lotion or petroleum jelly to the skin on your feet and to dry, brittle toenails. Use lotion that does not contain alcohol and is unscented. Do not apply lotion between your toes. Shoes and socks  Wear clean socks or stockings every day. Make sure they are not too tight. Do not wear knee-high stockings since they may decrease blood flow to your legs.  Wear shoes that fit properly and have enough cushioning. Always look in your shoes before you put them on to be sure there are no objects inside.  To break in new shoes, wear them for just a few hours a day. This prevents injuries on your feet. Wounds, scrapes, corns, and calluses  Check your feet daily for blisters, cuts, bruises, sores, and redness. If you cannot see the bottom of your feet, use a mirror or ask someone for help.  Do not cut corns or calluses or try to remove them with medicine.  If you  find a minor scrape, cut, or break in the skin on your feet, keep it and the skin around it clean and dry. You may clean these areas with mild soap and water. Do not clean the area with peroxide, alcohol, or iodine.  If you have a wound, scrape, corn, or callus on your foot, look at it several times a day to make sure it is healing and not infected. Check for: ? Redness, swelling, or pain. ? Fluid or blood. ? Warmth. ? Pus or a bad smell. General instructions  Do not cross your legs. This may decrease blood flow to your feet.  Do not use heating pads or hot water bottles on your feet. They may burn your skin. If you have lost feeling in your feet or legs, you may not know this is happening until it is too late.  Protect your feet from hot and cold by wearing shoes, such as at the beach or on hot pavement.  Schedule a complete foot exam at least once a year (annually) or more often if you have foot problems. If you have foot problems, report any cuts, sores, or bruises to your health care provider immediately. Contact a health care provider if:  You have a medical condition that increases your risk of infection and you have any cuts, sores, or bruises on your feet.  You have an injury that is not   healing.  You have redness on your legs or feet.  You feel burning or tingling in your legs or feet.  You have pain or cramps in your legs and feet.  Your legs or feet are numb.  Your feet always feel cold.  You have pain around a toenail. Get help right away if:  You have a wound, scrape, corn, or callus on your foot and: ? You have pain, swelling, or redness that gets worse. ? You have fluid or blood coming from the wound, scrape, corn, or callus. ? Your wound, scrape, corn, or callus feels warm to the touch. ? You have pus or a bad smell coming from the wound, scrape, corn, or callus. ? You have a fever. ? You have a red line going up your leg. Summary  Check your feet every day  for cuts, sores, red spots, swelling, and blisters.  Moisturize feet and legs daily.  Wear shoes that fit properly and have enough cushioning.  If you have foot problems, report any cuts, sores, or bruises to your health care provider immediately.  Schedule a complete foot exam at least once a year (annually) or more often if you have foot problems. This information is not intended to replace advice given to you by your health care provider. Make sure you discuss any questions you have with your health care provider. Document Revised: 10/28/2018 Document Reviewed: 03/08/2016 Elsevier Patient Education  2020 Elsevier Inc.  

## 2019-04-29 NOTE — Progress Notes (Addendum)
Subjective: Randy Hendrix presents today for follow up of preventative diabetic foot care, follow up flat feet b/l and painful mycotic nails b/l that are difficult to trim. Pain interferes with ambulation. Aggravating factors include wearing enclosed shoe gear. Pain is relieved with periodic professional debridement.   Randy Hendrix states Randy Hendrix has orthotics for his workboots.   No Known Allergies   Objective: Vitals:   04/26/19 0815  Temp: 97.6 F (36.4 C)    Pt 54 y.o. year old male  in NAD. AAO x 3.   Vascular Examination:  Capillary refill time to digits immediate b/l. Palpable DP pulses b/l. Palpable PT pulses b/l. Pedal hair sparse b/l. Skin temperature gradient within normal limits b/l.  Dermatological Examination: Pedal skin with normal turgor, texture and tone bilaterally. No open wounds bilaterally. No interdigital macerations bilaterally. Bilateral great toes with distal 1/3 of nailplates slightly thickened, discolored with subungual debris..  Musculoskeletal: Normal muscle strength 5/5 to all lower extremity muscle groups bilaterally, no pain crepitus or joint limitation noted with ROM b/l and pes planus deformity noted b/l.  Neurological: Protective sensation intact 5/5 intact bilaterally with 10g monofilament b/l Vibratory sensation intact b/l Proprioception intact bilaterally  Assessment: 1. Nail dystrophy   2. Ingrown nail of great toe of right foot   3. Pes planus of both feet   4. Newly diagnosed diabetes ([place])   5. Encounter for diabetic foot exam ([place])    Plan: -Encounter for diabetic foot exam. -Patient to continue soft, supportive shoe gear daily. -Patient to report any pedal injuries to medical professional immediately. -Offending nail borders debrided and curretaged b/l great toes. Borders cleansed with alcohol. Antibiotic ointment applied. No further treatment required by patient. -Randy Hendrix was advised to bring his orthotics and workboots on next visit for  evaluation. -Patient/POA to call should there be question/concern in the interim.  Return in about 3 months (around 07/27/2019) for diabetic nail trim.

## 2019-05-06 ENCOUNTER — Encounter: Payer: Self-pay | Admitting: Internal Medicine

## 2019-05-06 ENCOUNTER — Other Ambulatory Visit: Payer: Self-pay

## 2019-05-06 MED ORDER — JARDIANCE 25 MG PO TABS: 25.0000 mg | ORAL_TABLET | Freq: Every day | ORAL | 1 refills | Status: DC

## 2019-05-20 IMAGING — CT CT ABD-PELV W/ CM
2 of 5 series · 15 of 46 positions shown, 17 images · IV contrast (ISOVUE 300)
Comparison: Multiple exams, including 06/08/2004

CLINICAL DATA: Intermittent diarrhea and right lower quadrant
abdominal pain for several years.

EXAM:
CT ABDOMEN AND PELVIS WITH CONTRAST
TECHNIQUE: Multidetector CT imaging of the abdomen and pelvis was performed
using the standard protocol following bolus administration of
intravenous contrast.
CONTRAST:  100mL 3K4FOG-IMM IOPAMIDOL (3K4FOG-IMM) INJECTION 61%

[Series 2: abd/pel w · axial · 0.88mm/px · z∈[-543,-118]mm · 12 of 97 slices shown, 14 images]
[im 6/97  soft-tissue]
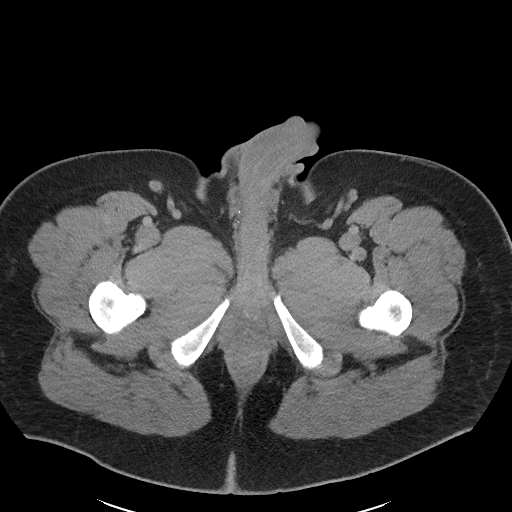
[im 6/97  bone]
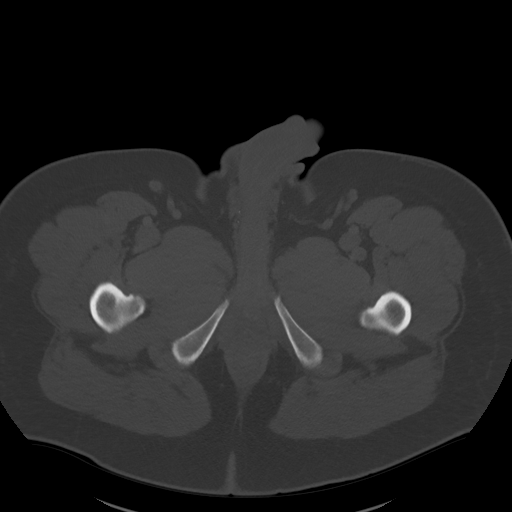
[im 16/97  soft-tissue]
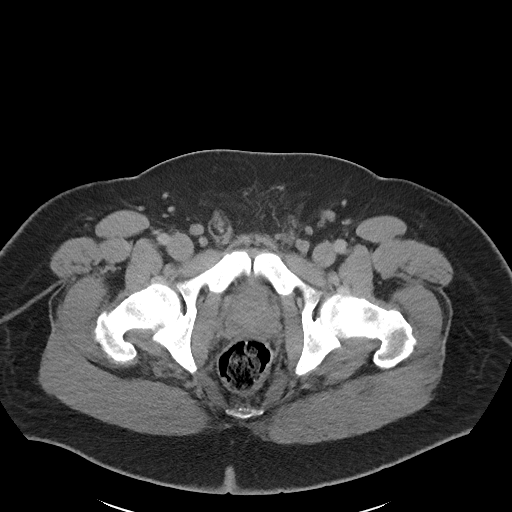
[im 21/97  soft-tissue]
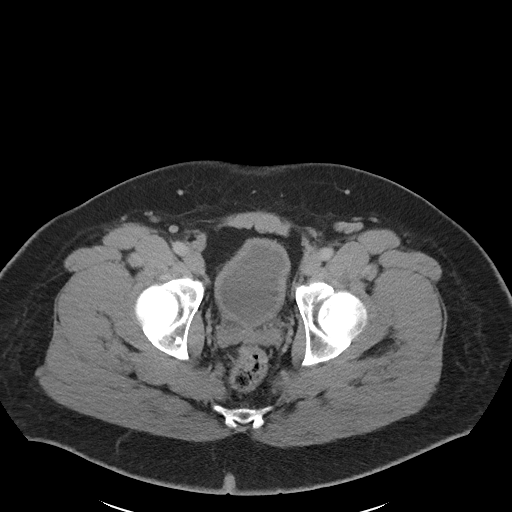
[im 31/97  soft-tissue]
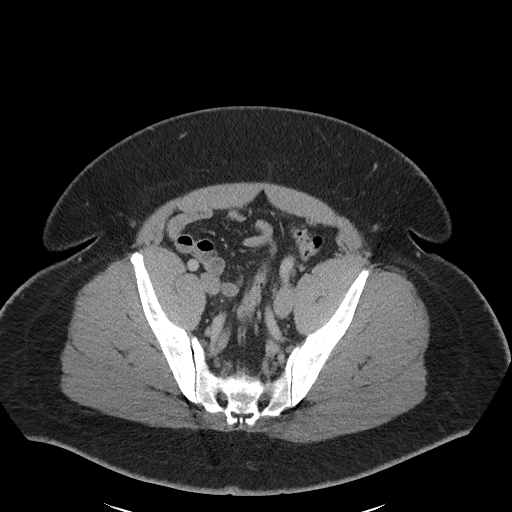
[im 36/97  soft-tissue]
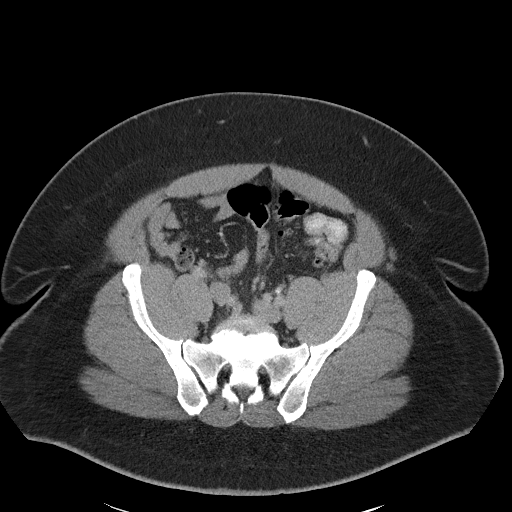
[im 46/97  soft-tissue]
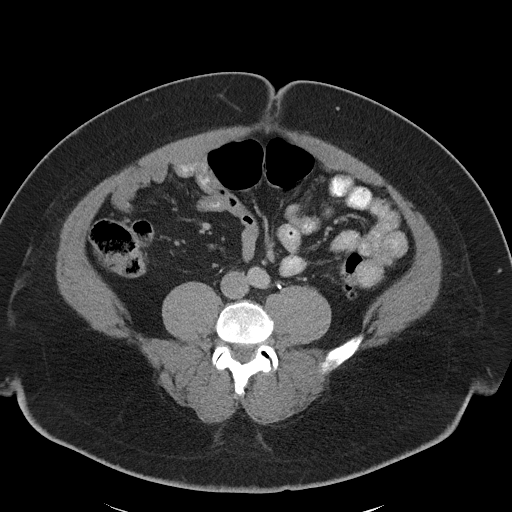
[im 51/97  soft-tissue]
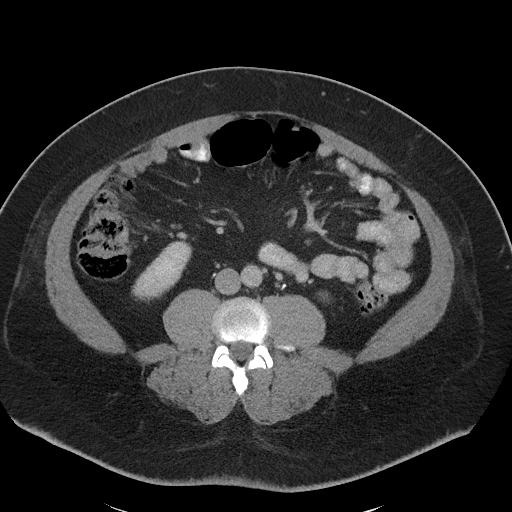
[im 61/97  soft-tissue]
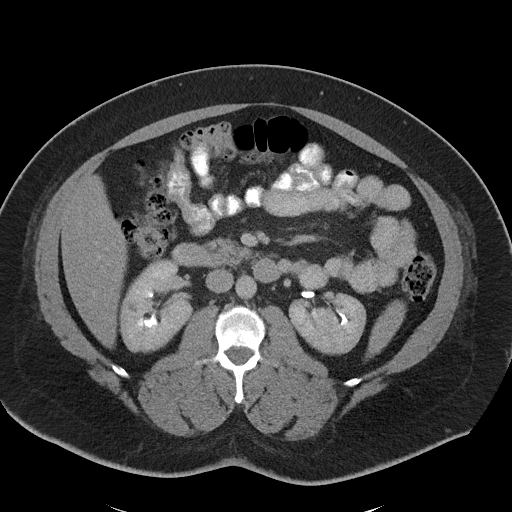
[im 66/97  soft-tissue]
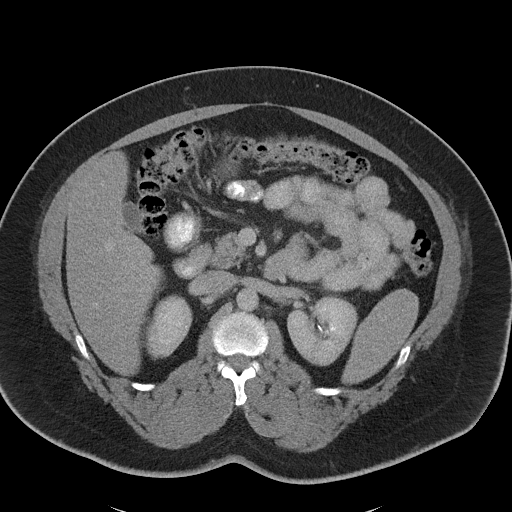
[im 66/97  bone]
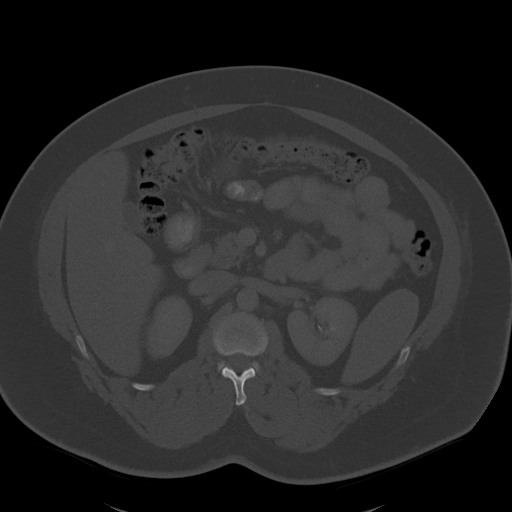
[im 76/97  soft-tissue]
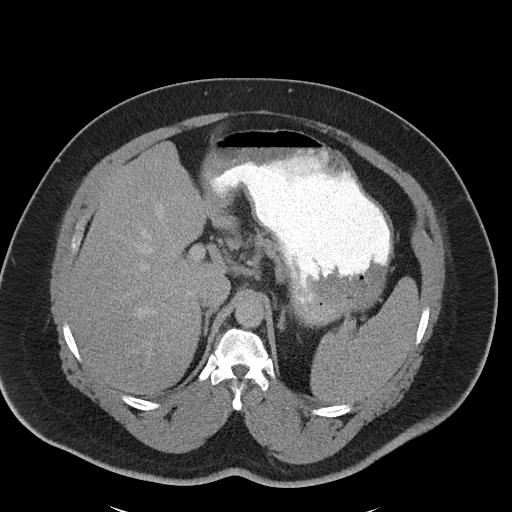
[im 81/97  soft-tissue]
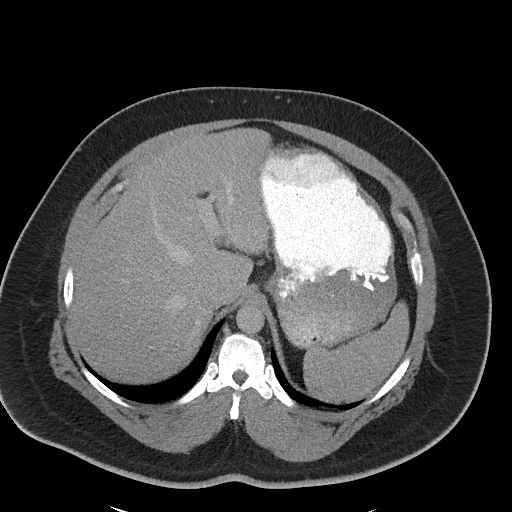
[im 91/97  soft-tissue]
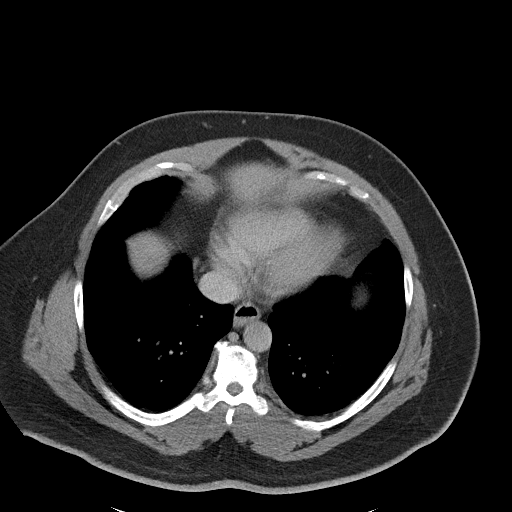

[Series 6: abd/pel w st · coronal · 0.90mm/px · 3 of 111 slices shown]
[im 37/111  soft-tissue]
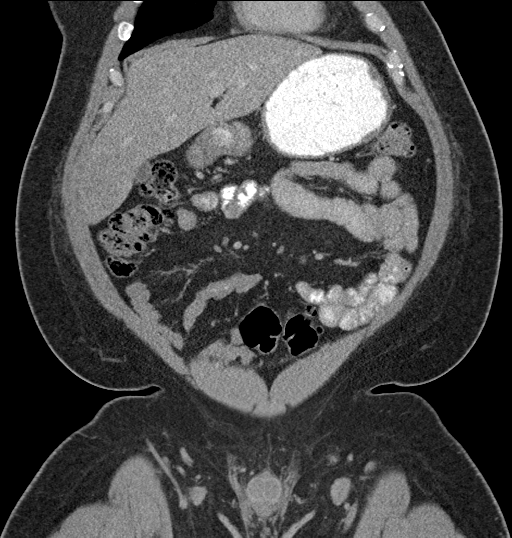
[im 49/111  soft-tissue]
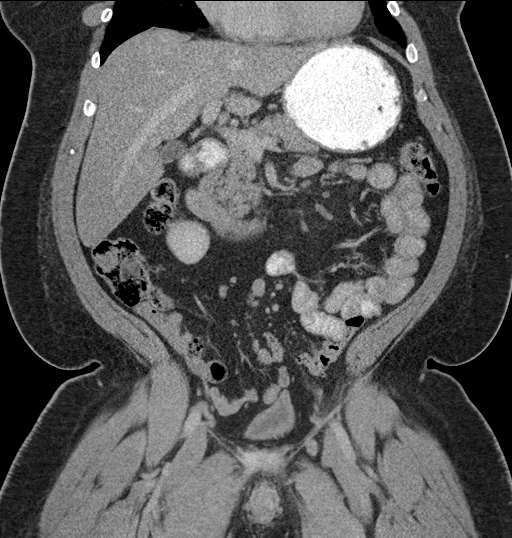
[im 62/111  soft-tissue]
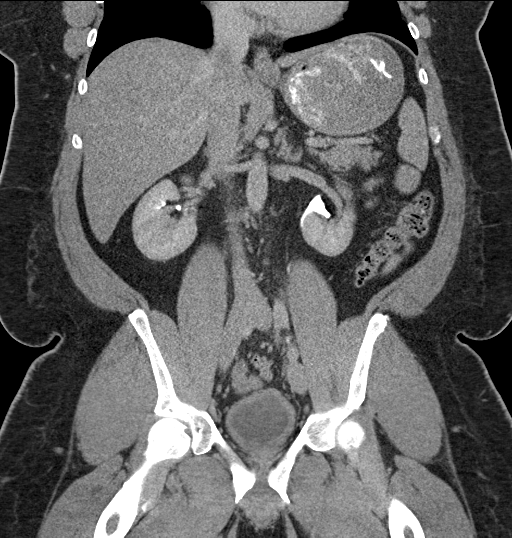

[15 of 46 positions shown; findings below may reference images not displayed]

FINDINGS: Lower chest: Lingular scarring, image [DATE].  Mild cardiomegaly.

Hepatobiliary: Mildly contracted gallbladder. Otherwise
unremarkable.

Pancreas: Unremarkable

Spleen: Unremarkable

Adrenals/Urinary Tract: 1.0 by 0.9 cm hypodense lesion of the right
kidney upper pole on image 36/2, technically too small to
characterize.

1.8 by 1.9 cm fluid density lesion of the right kidney lower pole
posteriorly on image 43/2, compatible with a cyst.

0.8 by 0.6 cm lesion of the right kidney lower pole on image 42/2
anteriorly, probably a cyst but technically too small to
characterize.

0.9 by 0.6 cm hypodense lesion of the left kidney lower pole on
image 43/2, technically too small to characterize.

Equivocal parenchymal hypodensity in the left kidney for example on
image 35/2.

Mild wall thickening of the urinary bladder although some of this
may be from nondistention.

There is contrast medium in the renal collecting systems on the
initial imaging. This lowers sensitivity for nonobstructive renal
calculi. No obstructive renal calculus is present.

Stomach/Bowel: Sigmoid colon diverticulosis. Mildly redundant
sigmoid colon. Normal appendix.

Vascular/Lymphatic: Minimal right common iliac artery
atherosclerotic calcification. Right inguinal lymph node 1.5 cm
short axis on image 95/2, borderline enlarged. Left inguinal lymph
node 1.2 cm in short axis on image 89/2. 1.1 cm peripancreatic lymph
node on image 47/6.

Reproductive: Unremarkable

Other: No supplemental non-categorized findings.

Musculoskeletal: Degenerative disc disease and facet arthropathy in
the lower lumbar spine causing bilateral foraminal impingement L5-S1
and left greater than right foraminal impingement at L4-5.
IMPRESSION: 1. Normal appendix.
2. Mild wall thickening of the urinary bladder-cystitis is not
excluded, correlate with urine analysis. There is also some
equivocal parenchymal hypodensity in the left kidney, I doubt that
this represents faint pyelonephritis but urine analysis should be
helpful in ensuring this.
3. Borderline right inguinal adenopathy, nonspecific for reactive
versus neoplastic etiology. I would suggest follow physical exam
attention to palpation to right inguinal lymph nodes in order to
ensure resolution. There is also a mildly prominent peripancreatic
lymph node of uncertain significance.
4. Multiple hypodense renal lesions, some of which are technically
too small to characterize, but statistically most likely to be
cysts.
5. Sigmoid colon diverticulosis.
6. Lower lumbar impingement.
7. Today's exam is not sensitive for nonobstructive renal calculi,
as there is contrast medium in the collecting systems on initial
imaging.

## 2019-06-16 ENCOUNTER — Other Ambulatory Visit: Payer: Self-pay

## 2019-06-16 ENCOUNTER — Ambulatory Visit (INDEPENDENT_AMBULATORY_CARE_PROVIDER_SITE_OTHER): Payer: 59 | Admitting: Internal Medicine

## 2019-06-16 ENCOUNTER — Encounter: Payer: Self-pay | Admitting: Internal Medicine

## 2019-06-16 VITALS — BP 118/76 | HR 78 | Temp 97.3°F | Ht 70.0 in | Wt 320.0 lb

## 2019-06-16 DIAGNOSIS — G4733 Obstructive sleep apnea (adult) (pediatric): Secondary | ICD-10-CM | POA: Diagnosis not present

## 2019-06-16 DIAGNOSIS — G4726 Circadian rhythm sleep disorder, shift work type: Secondary | ICD-10-CM | POA: Diagnosis not present

## 2019-06-16 NOTE — Assessment & Plan Note (Signed)
Continued encouragement to lose more weight.

## 2019-06-16 NOTE — Assessment & Plan Note (Signed)
Now working straight second shift.  Discussed sleep scheduling again.

## 2019-06-16 NOTE — Assessment & Plan Note (Signed)
Benefits from CPAP. Plan- replace old machine, changing to autopap 10-20 as discussed

## 2019-06-16 NOTE — Progress Notes (Signed)
HPI  M never smoker followed for OSA, former third shift worker, complicated by morbid obesity, allergic rhinitis, GERD, NPSG 2000:  AHI 124/hr  -----------------------------------------------------------------  04/30/2018- 54 year old male never smoker followed for OSA, complicated by GERD, allergic rhinitis, obesity CPAP 15/[facility] Download 98% compliance, AHI 1.1/ hr Body weight today 327 lbs UTD on flu and pvax Better with CPAP. No concerns, no other health issues reported today. Understands he is too heavy. Needs supplies.  06/16/19- 54 year old male never smoker followed for OSA, complicated by GERD, allergic rhinitis, obesity, DM2, Gout,  CPAP 15/[facility] Download compliance 96%, AHI 0.7/ hr Body weight today- 320 lbs Family tells him he snores through machine. DME tells him he is eligible for new machine. He is comfortable with long-term CPAP use. Note now dx'd DM.   ROS-see HPI + = positive Constitutional:    weight loss, night sweats, fevers, chills, fatigue, lassitude. HEENT:    headaches, difficulty swallowing, tooth/dental problems, sore throat,       sneezing, itching, ear ache, nasal congestion, post nasal drip, snoring CV:    chest pain, orthopnea, PND, swelling in lower extremities, anasarca,                                                    dizziness, palpitations Resp:   shortness of breath with exertion or at rest.                productive cough,   non-productive cough, coughing up of blood.              change in color of mucus.  wheezing.   Skin:    rash or lesions. GI:  No-   heartburn, indigestion, abdominal pain, nausea, vomiting, diarrhea,                 change in bowel habits, loss of appetite GU: dysuria, change in color of urine, no urgency or frequency.   flank pain. MS:   joint pain, stiffness, decreased range of motion, back pain. Neuro-     nothing unusual Psych:  change in mood or affect.  depression or anxiety.   memory  loss.  OBJ- Physical Exam General- Alert, Oriented, Affect-appropriate, Distress- none acute + obese Skin- rash-none, lesions- none, excoriation- none Lymphadenopathy- none Head- atraumatic            Eyes- Gross vision intact, PERRLA, conjunctivae and secretions clear            Ears- Hearing, canals-normal            Nose- Clear, no-Septal dev, mucus, polyps, erosion, perforation             Throat- Mallampati IV , mucosa clear , drainage- none, tonsils- atrophic Neck- flexible , trachea midline, no stridor , thyroid nl, carotid no bruit Chest - symmetrical excursion , unlabored           Heart/CV- RRR , no murmur , no gallop  , no rub, nl s1 s2                           - JVD- none , edema- none, stasis changes- none, varices- none           Lung- clear to P&A, wheeze- none, cough- none , dullness-none, rub-  none           Chest wall-  Abd-  Br/ Gen/ Rectal- Not done, not indicated Extrem- cyanosis- none, clubbing, none, atrophy- none, strength- nl Neuro- grossly intact to observation

## 2019-06-16 NOTE — Patient Instructions (Signed)
Order- DME [facility] Please replace old CPAP machine, change to autopap 10-20, continue mask of choice, humidifier, supplies, AirView/ card  Please call if we can help

## 2019-07-07 ENCOUNTER — Telehealth: Payer: Self-pay | Admitting: Internal Medicine

## 2019-07-07 NOTE — Telephone Encounter (Signed)
Spoke with patient's wife regarding prior message. Mrs.Hopson advised me that Mr.Kopec had both of his COVID vaccine's [place]. Put vaccine's in his chart. Northing else further needed.

## 2019-07-26 ENCOUNTER — Ambulatory Visit: Payer: 59 | Admitting: Podiatry

## 2019-08-01 ENCOUNTER — Encounter: Payer: Self-pay | Admitting: Internal Medicine

## 2019-08-02 MED ORDER — METFORMIN HCL 1000 MG PO TABS: 1000.0000 mg | ORAL_TABLET | Freq: Two times a day (BID) | ORAL | 0 refills | Status: DC

## 2019-08-09 ENCOUNTER — Encounter: Payer: Self-pay | Admitting: Internal Medicine

## 2019-08-09 ENCOUNTER — Ambulatory Visit (INDEPENDENT_AMBULATORY_CARE_PROVIDER_SITE_OTHER): Payer: 59 | Admitting: Internal Medicine

## 2019-08-09 ENCOUNTER — Other Ambulatory Visit: Payer: Self-pay

## 2019-08-09 VITALS — BP 124/82 | HR 88 | Temp 98.3°F | Ht 70.0 in | Wt 321.0 lb

## 2019-08-09 DIAGNOSIS — E1169 Type 2 diabetes mellitus with other specified complication: Secondary | ICD-10-CM | POA: Diagnosis not present

## 2019-08-09 DIAGNOSIS — I10 Essential (primary) hypertension: Secondary | ICD-10-CM | POA: Diagnosis not present

## 2019-08-09 DIAGNOSIS — M1A9XX0 Chronic gout, unspecified, without tophus (tophi): Secondary | ICD-10-CM

## 2019-08-09 LAB — COMPREHENSIVE METABOLIC PANEL
ALT: 32 U/L (ref 0–53)
AST: 24 U/L (ref 0–37)
Albumin: 4.4 g/dL (ref 3.5–5.2)
Alkaline Phosphatase: 65 U/L (ref 39–117)
BUN: 14 mg/dL (ref 6–23)
CO2: 27 mEq/L (ref 19–32)
Calcium: 9.5 mg/dL (ref 8.4–10.5)
Chloride: 104 mEq/L (ref 96–112)
Creatinine, Ser: 1.03 mg/dL (ref 0.40–1.50)
GFR: 90.79 mL/min (ref >60.00)
Glucose, Bld: 129 mg/dL — ABNORMAL HIGH (ref 70–99)
Potassium: 4.1 mEq/L (ref 3.5–5.1)
Sodium: 138 mEq/L (ref 135–145)
Total Bilirubin: 1 mg/dL (ref 0.2–1.2)
Total Protein: 7.1 g/dL (ref 6.0–8.3)

## 2019-08-09 LAB — CBC
HCT: 38.2 % — ABNORMAL LOW (ref 39.0–52.0)
Hemoglobin: 12 g/dL — ABNORMAL LOW (ref 13.0–17.0)
MCHC: 31.5 g/dL (ref 30.0–36.0)
MCV: 69.1 fl — ABNORMAL LOW (ref 78.0–100.0)
Platelets: 229 10*3/uL (ref 150.0–400.0)
RBC: 5.53 Mil/uL (ref 4.22–5.81)
RDW: 18.5 % — ABNORMAL HIGH (ref 11.5–15.5)
WBC: 9.1 10*3/uL (ref 4.0–10.5)

## 2019-08-09 LAB — LIPID PANEL
Cholesterol: 114 mg/dL (ref 0–200)
HDL: 37.1 mg/dL — ABNORMAL LOW (ref >39.00)
LDL Cholesterol: 66 mg/dL (ref 0–99)
NonHDL: 77.39
Total CHOL/HDL Ratio: 3
Triglycerides: 58 mg/dL (ref 0.0–149.0)
VLDL: 11.6 mg/dL (ref 0.0–40.0)

## 2019-08-09 LAB — HEMOGLOBIN A1C: Hgb A1c MFr Bld: 6 % (ref 4.6–6.5)

## 2019-08-09 LAB — MICROALBUMIN / CREATININE URINE RATIO
Creatinine,U: 127.2 mg/dL
Microalb Creat Ratio: 0.6 mg/g (ref 0.0–30.0)
Microalb, Ur: <0.7 mg/dL (ref 0.0–1.9)

## 2019-08-09 LAB — URIC ACID: Uric Acid, Serum: 5 mg/dL (ref 4.0–7.8)

## 2019-08-09 MED ORDER — METFORMIN HCL 1000 MG PO TABS: 1000.0000 mg | ORAL_TABLET | Freq: Two times a day (BID) | ORAL | 3 refills | Status: DC

## 2019-08-09 MED ORDER — ALLOPURINOL 100 MG PO TABS: 100.0000 mg | ORAL_TABLET | Freq: Every day | ORAL | 3 refills | Status: DC

## 2019-08-09 MED ORDER — EMPAGLIFLOZIN 25 MG PO TABS: 25.0000 mg | ORAL_TABLET | Freq: Every day | ORAL | 3 refills | Status: DC

## 2019-08-09 MED ORDER — LOSARTAN POTASSIUM 50 MG PO TABS: 50.0000 mg | ORAL_TABLET | Freq: Every day | ORAL | 3 refills | Status: DC

## 2019-08-09 NOTE — Assessment & Plan Note (Signed)
Weight stable with prior but relapsed with weight regain. Talked to him about the importance of weight loss for his health with diet and exercise.

## 2019-08-09 NOTE — Progress Notes (Signed)
   Subjective:   Patient ID: Randy Hendrix, male    DOB: [birth year], 54 y.o.   MRN: [id number]  HPI The patient is a 54 YO man coming in for diabetes (diagnosed last year and started on metformin 1000 mg BID, due to his wife's reports of high sugars we added jardiance but he did not return for follow up visit or labs, denies side effects, GI effects of metformin initially which are now mostly gone, denies high sugars, checking fasting which is around 100-120, today 130 as off meds for a few days, denies burning or numbness in feet, not sure about eye exam but went in fall to see his eye doctor, cannot recall their name) and weight (was working on weight loss and was down about 10-15 pounds but this is back, he is under a lot of stress due to wife's health problems) and blood pressure (taking losartan, denies side effects, did not come back for labs/visit once we started this medication last year, denies chest pains or headaches).   Review of Systems  Constitutional: Negative.   HENT: Negative.   Eyes: Negative.   Respiratory: Negative for cough, chest tightness and shortness of breath.   Cardiovascular: Negative for chest pain, palpitations and leg swelling.  Gastrointestinal: Negative for abdominal distention, abdominal pain, constipation, diarrhea, nausea and vomiting.  Musculoskeletal: Negative.   Skin: Negative.   Neurological: Negative.   Psychiatric/Behavioral: Negative.     Objective:  Physical Exam Constitutional:      Appearance: He is well-developed. He is obese.  HENT:     Head: Normocephalic and atraumatic.  Cardiovascular:     Rate and Rhythm: Normal rate and regular rhythm.  Pulmonary:     Effort: Pulmonary effort is normal. No respiratory distress.     Breath sounds: Normal breath sounds. No wheezing or rales.  Abdominal:     General: Bowel sounds are normal. There is no distension.     Palpations: Abdomen is soft.     Tenderness: There is no abdominal  tenderness. There is no rebound.  Musculoskeletal:     Cervical back: Normal range of motion.  Skin:    General: Skin is warm and dry.  Neurological:     Mental Status: He is alert and oriented to person, place, and time.     Coordination: Coordination normal.     Vitals:   08/09/19 0917  BP: 124/82  Pulse: 88  Temp: 98.3 F (36.8 C)  TempSrc: Oral  SpO2: 97%  Weight: (!) 321 lb (145.6 kg)  Height: 5\' 10"  (1.778 m)    This visit occurred during the SARS-CoV-2 public health emergency.  Safety protocols were in place, including screening questions prior to the visit, additional usage of staff PPE, and extensive cleaning of exam room while observing appropriate contact time as indicated for disinfecting solutions.   Assessment & Plan:

## 2019-08-09 NOTE — Patient Instructions (Signed)
Diabetes Mellitus and Standards of Medical Care Managing diabetes (diabetes mellitus) can be complicated. Your diabetes treatment may be managed by a team of health care providers, including:  A physician who specializes in diabetes (endocrinologist).  A nurse practitioner or physician assistant.  Nurses.  A diet and nutrition specialist (registered dietitian).  A certified diabetes educator (CDE).  An exercise specialist.  A pharmacist.  An eye doctor.  A foot specialist (podiatrist).  A dentist.  A primary care provider.  A mental health provider. Your health care providers follow guidelines to help you get the best quality of care. The following schedule is a general guideline for your diabetes management plan. Your health care providers may give you more specific instructions. Physical exams Upon being diagnosed with diabetes mellitus, and each year after that, your health care provider will ask about your medical and family history. He or she will also do a physical exam. Your exam may include:  Measuring your height, weight, and body mass index (BMI).  Checking your blood pressure. This will be done at every routine medical visit. Your target blood pressure may vary depending on your medical conditions, your age, and other factors.  Thyroid gland exam.  Skin exam.  Screening for damage to your nerves (peripheral neuropathy). This may include checking the pulse in your legs and feet and checking the level of sensation in your hands and feet.  A complete foot exam to inspect the structure and skin of your feet, including checking for cuts, bruises, redness, blisters, sores, or other problems.  Screening for blood vessel (vascular) problems, which may include checking the pulse in your legs and feet and checking your temperature. Blood tests Depending on your treatment plan and your personal needs, you may have the following tests done:  HbA1c (hemoglobin A1c). This  test provides information about blood sugar (glucose) control over the previous 2-3 months. It is used to adjust your treatment plan, if needed. This test will be done: ? At least 2 times a year, if you are meeting your treatment goals. ? 4 times a year, if you are not meeting your treatment goals or if treatment goals have changed.  Lipid testing, including total, LDL, and HDL cholesterol and triglyceride levels. ? The goal for LDL is less than 100 mg/dL (5.5 mmol/L). If you are at high risk for complications, the goal is less than 70 mg/dL (3.9 mmol/L). ? The goal for HDL is 40 mg/dL (2.2 mmol/L) or higher for men and 50 mg/dL (2.8 mmol/L) or higher for women. An HDL cholesterol of 60 mg/dL (3.3 mmol/L) or higher gives some protection against heart disease. ? The goal for triglycerides is less than 150 mg/dL (8.3 mmol/L).  Liver function tests.  Kidney function tests.  Thyroid function tests. Dental and eye exams  Visit your dentist two times a year.  If you have type 1 diabetes, your health care provider may recommend an eye exam 3-5 years after you are diagnosed, and then once a year after your first exam. ? For children with type 1 diabetes, a health care provider may recommend an eye exam when your child is age 10 or older and has had diabetes for 3-5 years. After the first exam, your child should get an eye exam once a year.  If you have type 2 diabetes, your health care provider may recommend an eye exam as soon as you are diagnosed, and then once a year after your first exam. Immunizations   The  yearly flu (influenza) vaccine is recommended for everyone 6 months or older who has diabetes.  The pneumonia (pneumococcal) vaccine is recommended for everyone 2 years or older who has diabetes. If you are 65 or older, you may get the pneumonia vaccine as a series of two separate shots.  The hepatitis B vaccine is recommended for adults shortly after being diagnosed with  diabetes.  Adults and children with diabetes should receive all other vaccines according to age-specific recommendations from the Centers for Disease Control and Prevention (CDC). Mental and emotional health Screening for symptoms of eating disorders, anxiety, and depression is recommended at the time of diagnosis and afterward as needed. If your screening shows that you have symptoms (positive screening result), you may need more evaluation and you may work with a mental health care provider. Treatment plan Your treatment plan will be reviewed at every medical visit. You and your health care provider will discuss:  How you are taking your medicines, including insulin.  Any side effects you are experiencing.  Your blood glucose target goals.  The frequency of your blood glucose monitoring.  Lifestyle habits, such as activity level as well as tobacco, alcohol, and substance use. Diabetes self-management education Your health care provider will assess how well you are monitoring your blood glucose levels and whether you are taking your insulin correctly. He or she may refer you to:  A certified diabetes educator to manage your diabetes throughout your life, starting at diagnosis.  A registered dietitian who can create or review your personal nutrition plan.  An exercise specialist who can discuss your activity level and exercise plan. Summary  Managing diabetes (diabetes mellitus) can be complicated. Your diabetes treatment may be managed by a team of health care providers.  Your health care providers follow guidelines in order to help you get the best quality of care.  Standards of care including having regular physical exams, blood tests, blood pressure monitoring, immunizations, screening tests, and education about how to manage your diabetes.  Your health care providers may also give you more specific instructions based on your individual health. This information is not intended  to replace advice given to you by your health care provider. Make sure you discuss any questions you have with your health care provider. Document Revised: 10/24/2017 Document Reviewed: 11/03/2015 Elsevier Patient Education  2020 Elsevier Inc.  

## 2019-08-09 NOTE — Assessment & Plan Note (Addendum)
Checking HgA1c, CMP, microalbumin to creatinine ratio. Foot exam done. Reminded about eye exam and asked to call us with his eye doctor name so we can check what he had done with them. Adjust metformin and jardiance as needed.

## 2019-08-09 NOTE — Assessment & Plan Note (Signed)
Taking losartan 50 mg daily with BP at goal. Checking CMP as he did not return for labs previously.

## 2019-08-09 NOTE — Assessment & Plan Note (Signed)
Checking uric acid and adjust allopurinol if needed.

## 2019-10-13 ENCOUNTER — Ambulatory Visit: Payer: 59 | Admitting: Podiatry

## 2019-10-15 ENCOUNTER — Encounter: Payer: Self-pay | Admitting: Podiatry

## 2019-10-15 ENCOUNTER — Other Ambulatory Visit: Payer: Self-pay

## 2019-10-15 ENCOUNTER — Ambulatory Visit (INDEPENDENT_AMBULATORY_CARE_PROVIDER_SITE_OTHER): Payer: 59 | Admitting: Podiatry

## 2019-10-15 DIAGNOSIS — M79675 Pain in left toe(s): Secondary | ICD-10-CM | POA: Diagnosis not present

## 2019-10-15 DIAGNOSIS — E119 Type 2 diabetes mellitus without complications: Secondary | ICD-10-CM

## 2019-10-15 DIAGNOSIS — L84 Corns and callosities: Secondary | ICD-10-CM

## 2019-10-15 DIAGNOSIS — M2142 Flat foot [pes planus] (acquired), left foot: Secondary | ICD-10-CM

## 2019-10-15 DIAGNOSIS — M2141 Flat foot [pes planus] (acquired), right foot: Secondary | ICD-10-CM

## 2019-10-15 DIAGNOSIS — B351 Tinea unguium: Secondary | ICD-10-CM

## 2019-10-15 DIAGNOSIS — M79674 Pain in right toe(s): Secondary | ICD-10-CM | POA: Diagnosis not present

## 2019-10-17 NOTE — Progress Notes (Signed)
Subjective: Randy Hendrix presents today for follow up of preventative diabetic foot care, follow up flat feet b/l and painful mycotic nails b/l that are difficult to trim. Pain interferes with ambulation. Aggravating factors include wearing enclosed shoe gear. Pain is relieved with periodic professional debridement.   He has brought in his work boots on today's visit.  No Known Allergies   Objective: There were no vitals filed for this visit.  Randy Hendrix is a pleasant 54 y.o. year old male, morbidly obese, in NAD. AAO x 3.   Vascular Examination:  Capillary refill time to digits immediate b/l. Palpable DP pulses b/l. Palpable PT pulses b/l. Pedal hair sparse b/l. Skin temperature gradient within normal limits b/l.  Dermatological Examination: Pedal skin with normal turgor, texture and tone bilaterally. No open wounds bilaterally. No interdigital macerations bilaterally. Hyperkeratotic lesion(s) submet head 4 left foot, submet head 5 left foot and submet head 5 right foot.  No erythema, no edema, no drainage, no fluctuance.  Musculoskeletal: Normal muscle strength 5/5 to all lower extremity muscle groups bilaterally, no pain crepitus or joint limitation noted with ROM b/l and pes planus deformity noted b/l. He has brought in a pair of workboots: [employer] with no abnormal wear noted on sole.   Neurological: Protective sensation intact 5/5 intact bilaterally with 10g monofilament b/l Vibratory sensation intact b/l Proprioception intact bilaterally  Assessment: 1. Pain due to onychomycosis of toenails of both feet   2. Callus   3. Pes planus of both feet   4. Type 2 diabetes mellitus without complication, without long-term current use of insulin ([place])     Plan: -Examined patient. -Continue diabetic foot care principles. -Toenails 1-5 b/l debrided in length and girth without iatrogenic laceration. -Calluses pared submetatarsal head(s) utilizing sterile scalpel blade  without incident. -Patient to continue soft, supportive shoe gear daily. -Patient to report any pedal injuries to medical professional immediately. -He was advised to bring his orthotics and workboots on next visit for evaluation. -Patient/POA to call should there be question/concern in the interim.  Return in about 3 months (around 01/15/2020) for diabetic nail trim.

## 2019-11-30 ENCOUNTER — Other Ambulatory Visit: Payer: Self-pay | Admitting: Internal Medicine

## 2019-12-06 NOTE — Progress Notes (Signed)
Subjective:    Patient ID: Randy Hendrix, male    DOB: 08/23/1965, 54 y.o.   MRN: [id number]  HPI The patient is here for an acute visit.   Leg pain - it started a little over one week ago.  He had a severe cramping in his left upper thigh.  He was sleeping and it woke him up.  he has cramping and pain in his anterior upper leg, posterior upper leg and hip.  No injury or unusual activity.     No back pain.  Intermittent pain  -if sitting still may has no pain, but can have pain with sitting.  Some numbness.    He can do things but is will hurt  - cramping pain.  Will make him stop doing whatever he is doing.     No swelling.  occ pain below knee.     Has tried motrin - helps a little.     No back issues in the past.  Typically active during day.    Pain 3-4 can reach up to 8-9/10.  He has an appt with Dr Junius Roads later today.   Medications and allergies reviewed with patient and updated if appropriate.  Patient Active Problem List   Diagnosis Date Noted  . Essential hypertension 08/09/2019  . Diabetes mellitus ([place]) 07/07/2018  . BPPV (benign paroxysmal positional vertigo) 12/04/2017  . Trigger finger of left thumb 07/21/2017  . 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 Gout 12/15/2014  . Routine health maintenance 07/28/2011  . Severe obesity (BMI >= 40) ([place]) 06/21/2008  . ALLERGIC RHINITIS 04/28/2007  . OSA (obstructive sleep apnea) 04/28/2007    Current Outpatient Medications on File Prior to Visit  Medication Sig Dispense Refill  . allopurinol (ZYLOPRIM) 100 MG tablet Take 1 tablet (100 mg total) by mouth daily. 90 tablet 3  . azelastine (ASTELIN) 0.1 % nasal spray Place 2 sprays into both nostrils 2 (two) times daily as needed. 30 mL 0  . blood glucose meter kit and supplies Dispense based on patient and insurance preference. Use up to four times daily as directed. (FOR ICD-10 E10.9, E11.9). 1 each 0  . Cyanocobalamin (VITAMIN B 12 PO) Take 1 tablet by  mouth daily.    . Diclofenac Sodium (PENNSAID) 2 % SOLN Place 1 application onto the skin 2 (two) times daily. 112 g 2  . dicyclomine (BENTYL) 10 MG capsule Take 1 tablet by mouth every 6 hours as needed for cramping and diarrhea. 90 capsule 0  . empagliflozin (JARDIANCE) 25 MG TABS tablet Take 1 tablet (25 mg total) by mouth daily. 90 tablet 3  . fexofenadine (ALLEGRA) 180 MG tablet Take 180 mg by mouth daily.    . Garcinia Cambogia-Chromium 500-200 MG-MCG TABS Take 1 capsule by mouth daily.    Marland Kitchen glucose blood (ACCU-CHEK GUIDE) test strip TEST FOUR TIMES DAILY AS DIRECTED 100 strip 2  . Green Tea, Camillia sinensis, (GREEN TEA PO) Take 1 capsule by mouth daily.    . Lancets (ONETOUCH DELICA PLUS ELTRVU02B) MISC USE TO CHECK SUGARS 4 TIMES DAILY 400 each 3  . losartan (COZAAR) 50 MG tablet Take 1 tablet (50 mg total) by mouth daily. 90 tablet 3  . metFORMIN (GLUCOPHAGE) 1000 MG tablet Take 1 tablet (1,000 mg total) by mouth 2 (two) times daily. 180 tablet 3  . methylcellulose (CITRUCEL) oral powder Citrucel: use as directed once daily    . mometasone (NASONEX) 50 MCG/ACT nasal spray Place 2  sprays into the nose daily.  1  . Multiple Vitamin (MULTIVITAMIN) capsule Take 1 capsule by mouth daily.    . naproxen (NAPROSYN) 375 MG tablet Take 1 tablet (375 mg total) by mouth 2 (two) times daily. 20 tablet 0  . VITAMIN D PO Take by mouth.    . SYSTANE ULTRA 0.4-0.3 % SOLN SMARTSIG:1 Drop(s) In Eye(s) PRN     No current facility-administered medications on file prior to visit.    Past Medical History:  Diagnosis Date  . Allergic rhinitis   . ALLERGIC RHINITIS   . Allergy   . Gastroenteritis   . GERD (gastroesophageal reflux disease)    occasionally  . Gout   . Hemorrhoid   . Kidney stone on right side 05/2004  . OBESITY, CLASS II   . OSA (obstructive sleep apnea)    CPAP qhs  . Oxygen deficiency   . PATELLAR DISLOCATION, RIGHT   . Sleep apnea    wears cpap    Past Surgical History:    Procedure Laterality Date  . HEMORRHOID SURGERY    . WISDOM TOOTH EXTRACTION      Social History   Socioeconomic History  . Marital status: Married    Spouse name: Annetta  . Number of children: 2  . Years of education: 30  . Highest education level: Not on file  Occupational History  . Occupation: Office manager in maintenance    Employer: [employer]  Tobacco Use  . Smoking status: Never Smoker  . Smokeless tobacco: Never Used  Vaping Use  . Vaping Use: Never used  Substance and Sexual Activity  . Alcohol use: No    Alcohol/week: 0.0 standard drinks  . Drug use: No  . Sexual activity: Yes    Partners: Female  Other Topics Concern  . Not on file  Social History Narrative   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 Married 1990. 1 daughter 2002 and 1 son 37. SO-multiple medical problems- but currently stable. Daughter w/ petit mal seizures. Marriage is in good health.     Social Determinants of Health   Financial Resource Strain:   . Difficulty of Paying Living Expenses: Not on file  Food Insecurity:   . Worried About Charity fundraiser in the Last Year: Not on file  . Ran Out of Food in the Last Year: Not on file  Transportation Needs:   . Lack of Transportation (Medical): Not on file  . Lack of Transportation (Non-Medical): Not on file  Physical Activity:   . Days of Exercise per Week: Not on file  . Minutes of Exercise per Session: Not on file  Stress:   . Feeling of Stress : Not on file  Social Connections:   . Frequency of Communication with Friends and Family: Not on file  . Frequency of Social Gatherings with Friends and Family: Not on file  . Attends Religious Services: Not on file  . Active Member of Clubs or Organizations: Not on file  . Attends Archivist Meetings: Not on file  . Marital Status: Not on file    Family History  Problem Relation Age of Onset  . Diabetes Mother   . Anemia Mother   .  Immunodeficiency Mother   . Stroke Father   . Dementia Father   . Hypertension Father   . Stomach cancer Maternal Grandmother   . Colon cancer Neg Hx   . Prostate cancer Neg Hx   . Cancer  Neg Hx   . Heart disease Neg Hx   . COPD Neg Hx   . Colon polyps Neg Hx   . Esophageal cancer Neg Hx   . Rectal cancer Neg Hx     Review of Systems     Objective:   Vitals:   12/07/19 0923  BP: 126/84  Pulse: 73  Temp: 98.4 F (36.9 C)  SpO2: 98%   BP Readings from Last 3 Encounters:  12/07/19 126/84  08/09/19 124/82  06/16/19 118/76   Wt Readings from Last 3 Encounters:  12/07/19 (!) 313 lb (142 kg)  08/09/19 (!) 321 lb (145.6 kg)  06/16/19 (!) 320 lb (145.2 kg)   Body mass index is 44.91 kg/m.   Physical Exam Constitutional:      Appearance: Normal appearance.  HENT:     Head: Normocephalic and atraumatic.  Musculoskeletal:     Right lower leg: No edema.     Left lower leg: No edema.     Comments: Some tenderness with palpation of left anterior and posterior leg  Skin:    General: Skin is warm and dry.  Neurological:     Mental Status: He is alert.     Sensory: No sensory deficit.     Motor: No weakness.     Comments: Pos straight leg raise on left            Assessment & Plan:    See Problem List for Assessment and Plan of chronic medical problems.    This visit occurred during the SARS-CoV-2 public health emergency.  Safety protocols were in place, including screening questions prior to the visit, additional usage of staff PPE, and extensive cleaning of exam room while observing appropriate contact time as indicated for disinfecting solutions.

## 2019-12-07 ENCOUNTER — Encounter: Payer: Self-pay | Admitting: Family Medicine

## 2019-12-07 ENCOUNTER — Ambulatory Visit (INDEPENDENT_AMBULATORY_CARE_PROVIDER_SITE_OTHER): Payer: 59 | Admitting: Family Medicine

## 2019-12-07 ENCOUNTER — Ambulatory Visit (INDEPENDENT_AMBULATORY_CARE_PROVIDER_SITE_OTHER): Payer: 59 | Admitting: Internal Medicine

## 2019-12-07 ENCOUNTER — Encounter: Payer: Self-pay | Admitting: Internal Medicine

## 2019-12-07 ENCOUNTER — Other Ambulatory Visit: Payer: Self-pay

## 2019-12-07 DIAGNOSIS — M79652 Pain in left thigh: Secondary | ICD-10-CM

## 2019-12-07 DIAGNOSIS — M5416 Radiculopathy, lumbar region: Secondary | ICD-10-CM | POA: Diagnosis not present

## 2019-12-07 MED ORDER — CYCLOBENZAPRINE HCL 10 MG PO TABS
5.0000 mg | ORAL_TABLET | Freq: Three times a day (TID) | ORAL | 0 refills | Status: DC | PRN
Start: 2019-12-07 — End: 2020-08-08

## 2019-12-07 MED ORDER — GABAPENTIN 300 MG PO CAPS: 300.0000 mg | ORAL_CAPSULE | Freq: Every day | ORAL | 3 refills | Status: DC

## 2019-12-07 MED ORDER — MELOXICAM 15 MG PO TABS: 15.0000 mg | ORAL_TABLET | Freq: Every day | ORAL | 0 refills | Status: DC

## 2019-12-07 NOTE — Patient Instructions (Signed)
Medications reviewed and updated.  Changes include :    Cyclobenzaprine ( muscle relaxer) 5-10 mg at night or three times a day as needed  meloxicam 15mg  with daily food.  (anti-inflammatory) - no advil or motrin while taking.  Gabapentin 300 mg at night ( nerve pain medication)   Your prescription(s) have been submitted to your pharmacy. Please take as directed and contact our office if you believe you are having problem(s) with the medication(s).     Sciatica Rehab Ask your health care provider which exercises are safe for you. Do exercises exactly as told by your health care provider and adjust them as directed. It is normal to feel mild stretching, pulling, tightness, or discomfort as you do these exercises. Stop right away if you feel sudden pain or your pain gets worse. Do not begin these exercises until told by your health care provider. Stretching and range-of-motion exercises These exercises warm up your muscles and joints and improve the movement and flexibility of your hips and back. These exercises also help to relieve pain, numbness, and tingling. Sciatic nerve glide 1. Sit in a chair with your head facing down toward your chest. Place your hands behind your back. Let your shoulders slump forward. 2. Slowly straighten one of your legs while you tilt your head back as if you are looking toward the ceiling. Only straighten your leg as far as you can without making your symptoms worse. 3. Hold this position for __________ seconds. 4. Slowly return to the starting position. 5. Repeat with your other leg. Repeat __________ times. Complete this exercise __________ times a day. Knee to chest with hip adduction and internal rotation  1. Lie on your back on a firm surface with both legs straight. 2. Bend one of your knees and move it up toward your chest until you feel a gentle stretch in your lower back and buttock. Then, move your knee toward the shoulder that is on the opposite  side from your leg. This is hip adduction and internal rotation. ? Hold your leg in this position by holding on to the front of your knee. 3. Hold this position for __________ seconds. 4. Slowly return to the starting position. 5. Repeat with your other leg. Repeat __________ times. Complete this exercise __________ times a day. Prone extension on elbows  1. Lie on your abdomen on a firm surface. A bed may be too soft for this exercise. 2. Prop yourself up on your elbows. 3. Use your arms to help lift your chest up until you feel a gentle stretch in your abdomen and your lower back. ? This will place some of your body weight on your elbows. If this is uncomfortable, try stacking pillows under your chest. ? Your hips should stay down, against the surface that you are lying on. Keep your hip and back muscles relaxed. 4. Hold this position for __________ seconds. 5. Slowly relax your upper body and return to the starting position. Repeat __________ times. Complete this exercise __________ times a day. Strengthening exercises These exercises build strength and endurance in your back. Endurance is the ability to use your muscles for a long time, even after they get tired. Pelvic tilt This exercise strengthens the muscles that lie deep in the abdomen. 1. Lie on your back on a firm surface. Bend your knees and keep your feet flat on the floor. 2. Tense your abdominal muscles. Tip your pelvis up toward the ceiling and flatten your lower back into the  floor. ? To help with this exercise, you may place a small towel under your lower back and try to push your back into the towel. 3. Hold this position for __________ seconds. 4. Let your muscles relax completely before you repeat this exercise. Repeat __________ times. Complete this exercise __________ times a day. Alternating arm and leg raises  1. Get on your hands and knees on a firm surface. If you are on a hard floor, you may want to use  padding, such as an exercise mat, to cushion your knees. 2. Line up your arms and legs. Your hands should be directly below your shoulders, and your knees should be directly below your hips. 3. Lift your left leg behind you. At the same time, raise your right arm and straighten it in front of you. ? Do not lift your leg higher than your hip. ? Do not lift your arm higher than your shoulder. ? Keep your abdominal and back muscles tight. ? Keep your hips facing the ground. ? Do not arch your back. ? Keep your balance carefully, and do not hold your breath. 4. Hold this position for __________ seconds. 5. Slowly return to the starting position. 6. Repeat with your right leg and your left arm. Repeat __________ times. Complete this exercise __________ times a day. Posture and body mechanics Good posture and healthy body mechanics can help to relieve stress in your body's tissues and joints. Body mechanics refers to the movements and positions of your body while you do your daily activities. Posture is part of body mechanics. Good posture means:  Your spine is in its natural S-curve position (neutral).  Your shoulders are pulled back slightly.  Your head is not tipped forward. Follow these guidelines to improve your posture and body mechanics in your everyday activities. Standing   When standing, keep your spine neutral and your feet about hip width apart. Keep a slight bend in your knees. Your ears, shoulders, and hips should line up.  When you do a task in which you stand in one place for a long time, place one foot up on a stable object that is 2-4 inches (5-10 cm) high, such as a footstool. This helps keep your spine neutral. Sitting   When sitting, keep your spine neutral and keep your feet flat on the floor. Use a footrest, if necessary, and keep your thighs parallel to the floor. Avoid rounding your shoulders, and avoid tilting your head forward.  When working at a desk or a  computer, keep your desk at a height where your hands are slightly lower than your elbows. Slide your chair under your desk so you are close enough to maintain good posture.  When working at a computer, place your monitor at a height where you are looking straight ahead and you do not have to tilt your head forward or downward to look at the screen. Resting  When lying down and resting, avoid positions that are most painful for you.  If you have pain with activities such as sitting, bending, stooping, or squatting, lie in a position in which your body does not bend very much. For example, avoid curling up on your side with your arms and knees near your chest (fetal position).  If you have pain with activities such as standing for a long time or reaching with your arms, lie with your spine in a neutral position and bend your knees slightly. Try the following positions: ? Lying on your side  with a pillow between your knees. ? Lying on your back with a pillow under your knees. Lifting   When lifting objects, keep your feet at least shoulder width apart and tighten your abdominal muscles.  Bend your knees and hips and keep your spine neutral. It is important to lift using the strength of your legs, not your back. Do not lock your knees straight out.  Always ask for help to lift heavy or awkward objects. This information is not intended to replace advice given to you by your health care provider. Make sure you discuss any questions you have with your health care provider. Document Revised: 05/29/2018 Document Reviewed: 02/26/2018 Elsevier Patient Education  [place].

## 2019-12-07 NOTE — Assessment & Plan Note (Signed)
Acute Symptoms suggestive of radiculopathy, but anterior upper leg pain does not fit with that Will hold off on xray - seeing Dr Junius Roads later today Start gabapentin 300 mg at night Flexeril 5-10 mg TID prn for muscle spasms meloxicam 15 mg daily Wanted to avoid steroids Discussed we can adjust these meds if needed He will be seeing Dr Junius Roads so he can f/u with him or [place] if no improvement Consider Exercises given for sciatica

## 2019-12-07 NOTE — Progress Notes (Signed)
Office Visit Note   Patient: Randy Hendrix           Date of Birth: 05/12/1965           MRN: [id number] Visit Date: 12/07/2019 Requested by: Hoyt Koch, MD [address] [place],  [place] [address] PCP: Hoyt Koch, MD  Subjective: Chief Complaint  Patient presents with  . Left Thigh - Pain    Pain in anterior thigh x 2 weeks - just woke up with a "cramp" and it has gotten worse. Pain radiates around to the buttock. No groin pain. Saw Dr. Quay Burow today - was given meds for this pain.    HPI: He is here with left thigh pain.  Symptoms started 2 weeks ago, no injury.  He woke up 1 day with a cramp in his anterior thigh and he has had problems since then.  Occasionally the pain radiates around to the buttocks.  Pain does not go down the leg, no back pain, no numbness or tingling.  Denies any groin pain.  He went to his PCP today and was given medications but he has not tried them yet.  He has diabetes which has been well controlled.  His last A1c was 6.0.               ROS:   All other systems were reviewed and are negative.  Objective: Vital Signs: There were no vitals taken for this visit.  Physical Exam:  General:  Alert and oriented, in no acute distress. Pulm:  Breathing unlabored. Psy:  Normal mood, congruent affect. Skin: No rash Left leg: No significant low back pain.  Good range of motion and no significant pain with passive internal hip rotation.  He has tenderness over the mid quadriceps, and a little bit over the greater trochanter.  Lower extremity strength and reflexes are normal.  Imaging: No results found.  Assessment & Plan: 1.  Left thigh pain, etiology uncertain. -Discussed options with him, he wants to try intramuscular injection today followed by physical therapy.  He will take the medications already prescribed.  If symptoms persist, then we will do imaging with x-rays of the hip, lumbar spine.  Possibly MRI scan of the lumbar  spine.     Procedures: Left hip injection: After sterile prep with Betadine, injected 3 cc 1% lidocaine without epinephrine and 6 mg betamethasone into the greater trochanter area.    PMFS History: Patient Active Problem List   Diagnosis Date Noted  . Essential hypertension 08/09/2019  . Diabetes mellitus ([place]) 07/07/2018  . BPPV (benign paroxysmal positional vertigo) 12/04/2017  . Trigger finger of left thumb 07/21/2017  . 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 Gout 12/15/2014  . Routine health maintenance 07/28/2011  . Severe obesity (BMI >= 40) ([place]) 06/21/2008  . ALLERGIC RHINITIS 04/28/2007  . OSA (obstructive sleep apnea) 04/28/2007   Past Medical History:  Diagnosis Date  . Allergic rhinitis   . ALLERGIC RHINITIS   . Allergy   . Gastroenteritis   . GERD (gastroesophageal reflux disease)    occasionally  . Gout   . Hemorrhoid   . Kidney stone on right side 05/2004  . OBESITY, CLASS II   . OSA (obstructive sleep apnea)    CPAP qhs  . Oxygen deficiency   . PATELLAR DISLOCATION, RIGHT   . Sleep apnea    wears cpap    Family History  Problem Relation Age of Onset  . Diabetes Mother   .  Anemia Mother   . Immunodeficiency Mother   . Stroke Father   . Dementia Father   . Hypertension Father   . Stomach cancer Maternal Grandmother   . Colon cancer Neg Hx   . Prostate cancer Neg Hx   . Cancer Neg Hx   . Heart disease Neg Hx   . COPD Neg Hx   . Colon polyps Neg Hx   . Esophageal cancer Neg Hx   . Rectal cancer Neg Hx     Past Surgical History:  Procedure Laterality Date  . HEMORRHOID SURGERY    . WISDOM TOOTH EXTRACTION     Social History   Occupational History  . Occupation: Office manager in maintenance    Employer: [employer]  Tobacco Use  . Smoking status: Never Smoker  . Smokeless tobacco: Never Used  Vaping Use  . Vaping Use: Never used  Substance and Sexual Activity  . Alcohol use: No    Alcohol/week: 0.0 standard drinks  . Drug use:  No  . Sexual activity: Yes    Partners: Female

## 2020-01-03 ENCOUNTER — Other Ambulatory Visit: Payer: Self-pay | Admitting: Internal Medicine

## 2020-01-09 ENCOUNTER — Other Ambulatory Visit: Payer: Self-pay | Admitting: Internal Medicine

## 2020-01-26 ENCOUNTER — Encounter: Payer: Self-pay | Admitting: Podiatry

## 2020-01-26 ENCOUNTER — Ambulatory Visit (INDEPENDENT_AMBULATORY_CARE_PROVIDER_SITE_OTHER): Payer: 59 | Admitting: Podiatry

## 2020-01-26 ENCOUNTER — Other Ambulatory Visit: Payer: Self-pay

## 2020-01-26 DIAGNOSIS — M79674 Pain in right toe(s): Secondary | ICD-10-CM

## 2020-01-26 DIAGNOSIS — E119 Type 2 diabetes mellitus without complications: Secondary | ICD-10-CM

## 2020-01-26 DIAGNOSIS — B351 Tinea unguium: Secondary | ICD-10-CM

## 2020-01-26 DIAGNOSIS — M79675 Pain in left toe(s): Secondary | ICD-10-CM | POA: Diagnosis not present

## 2020-01-26 NOTE — Progress Notes (Signed)
Subjective: Randy Hendrix presents today for follow up of preventative diabetic foot care, follow up flat feet b/l and painful mycotic nails b/l that are difficult to trim. Pain interferes with ambulation. Aggravating factors include wearing enclosed shoe gear. Pain is relieved with periodic professional debridement.   He voices no new pedal problems on today's visit.  No Known Allergies   Objective: There were no vitals filed for this visit.  Randy Hendrix is a pleasant 54 y.o. year old male, morbidly obese, in NAD. AAO x 3.   Vascular Examination:  Capillary refill time to digits immediate b/l. Palpable DP pulses b/l. Palpable PT pulses b/l. Pedal hair sparse b/l. Skin temperature gradient within normal limits b/l.  Dermatological Examination: Pedal skin with normal turgor, texture and tone bilaterally. No open wounds bilaterally. No interdigital macerations bilaterally.   Musculoskeletal: Normal muscle strength 5/5 to all lower extremity muscle groups bilaterally, no pain crepitus or joint limitation noted with ROM b/l and pes planus deformity noted b/l. He is wearing appropriate shoe gear on today's visit.  Neurological: Protective sensation intact 5/5 intact bilaterally with 10g monofilament b/l Vibratory sensation intact b/l Proprioception intact bilaterally  Assessment: 1. Pain due to onychomycosis of toenails of both feet   2. Type 2 diabetes mellitus without complication, without long-term current use of insulin ([place])     Plan: -Examined patient. -Continue diabetic foot care principles. -Toenails 1-5 b/l debrided in length and girth without iatrogenic laceration. -Patient to continue soft, supportive shoe gear daily. -Patient to report any pedal injuries to medical professional immediately. -He was advised to bring his orthotics and workboots on next visit for evaluation. -Patient/POA to call should there be question/concern in the interim.  Return in about 3  months (around 04/25/2020) for diabetic foot care.

## 2020-02-08 ENCOUNTER — Encounter: Payer: Self-pay | Admitting: Internal Medicine

## 2020-02-08 ENCOUNTER — Other Ambulatory Visit: Payer: Self-pay

## 2020-02-08 ENCOUNTER — Ambulatory Visit (INDEPENDENT_AMBULATORY_CARE_PROVIDER_SITE_OTHER): Payer: 59 | Admitting: Internal Medicine

## 2020-02-08 VITALS — BP 120/70 | HR 78 | Temp 98.5°F | Ht 70.0 in | Wt 309.8 lb

## 2020-02-08 DIAGNOSIS — E1169 Type 2 diabetes mellitus with other specified complication: Secondary | ICD-10-CM

## 2020-02-08 DIAGNOSIS — Z23 Encounter for immunization: Secondary | ICD-10-CM

## 2020-02-08 LAB — POCT GLYCOSYLATED HEMOGLOBIN (HGB A1C): Hemoglobin A1C: 5.5 % (ref 4.0–5.6)

## 2020-02-08 MED ORDER — MELOXICAM 15 MG PO TABS
15.0000 mg | ORAL_TABLET | Freq: Every day | ORAL | 3 refills | Status: DC
Start: 2020-02-08 — End: 2020-03-10

## 2020-02-08 MED ORDER — AZELASTINE HCL 0.1 % NA SOLN
2.0000 | Freq: Two times a day (BID) | NASAL | 6 refills | Status: DC | PRN
Start: 2020-02-08 — End: 2021-02-13

## 2020-02-08 NOTE — Progress Notes (Signed)
   Subjective:   Patient ID: Randy Hendrix, male    DOB: [birth year], 54 y.o.   MRN: [id number]  HPI The patient is a 54 YO man coming in for follow up of diabetes. He is taking jardiance and metformin. Denies low sugars. Does have some tingling but this is not progressive and not limiting.   Review of Systems  Constitutional: Negative.   HENT: Negative.   Eyes: Negative.   Respiratory: Negative for cough, chest tightness and shortness of breath.   Cardiovascular: Negative for chest pain, palpitations and leg swelling.  Gastrointestinal: Negative for abdominal distention, abdominal pain, constipation, diarrhea, nausea and vomiting.  Musculoskeletal: Negative.   Skin: Negative.   Neurological: Negative.   Psychiatric/Behavioral: Negative.     Objective:  Physical Exam Constitutional:      Appearance: He is well-developed and well-nourished. He is obese.  HENT:     Head: Normocephalic and atraumatic.  Eyes:     Extraocular Movements: EOM normal.  Cardiovascular:     Rate and Rhythm: Normal rate and regular rhythm.  Pulmonary:     Effort: Pulmonary effort is normal. No respiratory distress.     Breath sounds: Normal breath sounds. No wheezing or rales.  Abdominal:     General: Bowel sounds are normal. There is no distension.     Palpations: Abdomen is soft.     Tenderness: There is no abdominal tenderness. There is no rebound.  Musculoskeletal:        General: No edema.     Cervical back: Normal range of motion.  Skin:    General: Skin is warm and dry.  Neurological:     Mental Status: He is alert and oriented to person, place, and time.     Coordination: Coordination normal.  Psychiatric:        Mood and Affect: Mood and affect normal.     Vitals:   02/08/20 0800  BP: 120/70  Pulse: 78  Temp: 98.5 F (36.9 C)  TempSrc: Oral  SpO2: 97%  Weight: (!) 309 lb 12.8 oz (140.5 kg)  Height: 5\' 10"  (1.778 m)    This visit occurred during the SARS-CoV-2 public  health emergency.  Safety protocols were in place, including screening questions prior to the visit, additional usage of staff PPE, and extensive cleaning of exam room while observing appropriate contact time as indicated for disinfecting solutions.   Assessment & Plan:  Pneumonia 23 given at visit

## 2020-02-08 NOTE — Patient Instructions (Signed)
The HgA1c is 5.5 which is great. We will keep the medicines the same for the next 6 months and when we see you back for the physical if the levels are still good we will likely stop one of the sugar medicines.

## 2020-02-09 NOTE — Assessment & Plan Note (Signed)
POC HgA1c is lower and we discussed possibly reducing medications. Since they seem to be helping with his weight loss goals we elected to keep jardiance and metformin the same for another 6 months to maximize weight loss and adjust then. Can adjust sooner for any low sugars. Pneumonia 23 given at visit.

## 2020-02-14 ENCOUNTER — Encounter: Payer: Self-pay | Admitting: Internal Medicine

## 2020-02-16 MED ORDER — GABAPENTIN 300 MG PO CAPS
300.0000 mg | ORAL_CAPSULE | Freq: Every day | ORAL | 3 refills | Status: DC
Start: 2020-02-16 — End: 2021-02-02

## 2020-03-10 ENCOUNTER — Other Ambulatory Visit: Payer: Self-pay | Admitting: Internal Medicine

## 2020-04-26 ENCOUNTER — Encounter: Payer: Self-pay | Admitting: Podiatry

## 2020-04-26 ENCOUNTER — Other Ambulatory Visit: Payer: Self-pay

## 2020-04-26 ENCOUNTER — Ambulatory Visit (INDEPENDENT_AMBULATORY_CARE_PROVIDER_SITE_OTHER): Payer: 59 | Admitting: Podiatry

## 2020-04-26 DIAGNOSIS — M79674 Pain in right toe(s): Secondary | ICD-10-CM

## 2020-04-26 DIAGNOSIS — E119 Type 2 diabetes mellitus without complications: Secondary | ICD-10-CM | POA: Diagnosis not present

## 2020-04-26 DIAGNOSIS — M79675 Pain in left toe(s): Secondary | ICD-10-CM

## 2020-04-26 DIAGNOSIS — B351 Tinea unguium: Secondary | ICD-10-CM | POA: Diagnosis not present

## 2020-04-26 DIAGNOSIS — M2141 Flat foot [pes planus] (acquired), right foot: Secondary | ICD-10-CM

## 2020-04-26 DIAGNOSIS — M2142 Flat foot [pes planus] (acquired), left foot: Secondary | ICD-10-CM

## 2020-04-26 NOTE — Progress Notes (Signed)
Subjective: Randy Hendrix presents today for follow up of preventative diabetic foot care, follow up flat feet b/l and painful mycotic nails b/l that are difficult to trim. Pain interferes with ambulation. Aggravating factors include wearing enclosed shoe gear. Pain is relieved with periodic professional debridement.   He voices no new pedal problems on today's visit. His blood glucose was 110 mg/dl this morning.  PCP is Dr. Pricilla Holm and last visit was 02/08/2020.  No Known Allergies   Objective: There were no vitals filed for this visit.  Randy Hendrix is a pleasant 55 y.o. year old male, morbidly obese, in NAD. AAO x 3.   Vascular Examination:  Capillary refill time to digits immediate b/l. Palpable DP pulses b/l. Palpable PT pulses b/l. Pedal hair sparse b/l. Skin temperature gradient within normal limits b/l.  Dermatological Examination: Pedal skin with normal turgor, texture and tone bilaterally. No open wounds bilaterally. No interdigital macerations bilaterally.   Musculoskeletal: Normal muscle strength 5/5 to all lower extremity muscle groups bilaterally, no pain crepitus or joint limitation noted with ROM b/l and pes planus deformity noted b/l. He is wearing appropriate shoe gear on today's visit.  Neurological: Protective sensation intact 5/5 intact bilaterally with 10g monofilament b/l Vibratory sensation intact b/l Proprioception intact bilaterally  Assessment: 1. Pain due to onychomycosis of toenails of both feet   2. Pes planus of both feet   3. Type 2 diabetes mellitus without complication, without long-term current use of insulin ([place])     Plan: -Examined patient. -No new findings. No new orders. -Continue diabetic foot care principles. -Toenails 1-5 b/l debrided in length and girth without iatrogenic laceration. -Patient to continue soft, supportive shoe gear daily. -Patient to report any pedal injuries to medical professional  immediately. -Patient/POA to call should there be question/concern in the interim.  Return in about 3 months (around 07/27/2020) for diabetic nail trim.

## 2020-06-14 NOTE — Progress Notes (Signed)
HPI  M never smoker followed for OSA, former third shift worker, complicated by morbid obesity, allergic rhinitis, GERD, NPSG 2000:  AHI 124/hr  -----------------------------------------------------------------   06/16/19- 55 year old male never smoker followed for OSA, complicated by GERD, allergic rhinitis, obesity, DM2, Gout,  CPAP 15/[facility] Download compliance 96%, AHI 0.7/ hr Body weight today- 320 lbs Family tells him he snores through machine. DME tells him he is eligible for new machine. He is comfortable with long-term CPAP use. Note now dx'd DM.   06/15/20- 55 year old male never smoker followed for OSA, complicated by GERD, allergic rhinitis, obesity, DM2, Gout,  CPAP  Auto 10-20/[facility] Download-compliance 97%, AHI 0.6/ hr Body weight today-314 lbs Covid vax- -----Still using CPAP no other complaints Sleeps better with CPAP Wife comments on his heavy breathing, without cough or wheeze.  ROS-see HPI + = positive Constitutional:    weight loss, night sweats, fevers, chills, fatigue, lassitude. HEENT:    headaches, difficulty swallowing, tooth/dental problems, sore throat,       sneezing, itching, ear ache, nasal congestion, post nasal drip, snoring CV:    chest pain, orthopnea, PND, swelling in lower extremities, anasarca,                                                    dizziness, palpitations Resp:   shortness of breath with exertion or at rest.                productive cough,   non-productive cough, coughing up of blood.              change in color of mucus.  wheezing.   Skin:    rash or lesions. GI:  No-   heartburn, indigestion, abdominal pain, nausea, vomiting, diarrhea,                 change in bowel habits, loss of appetite GU: dysuria, change in color of urine, no urgency or frequency.   flank pain. MS:   joint pain, stiffness, decreased range of motion, back pain. Neuro-     nothing unusual Psych:  change in mood or affect.   depression or anxiety.   memory loss.  OBJ- Physical Exam General- Alert, Oriented, Affect-appropriate, Distress- none acute + obese Skin- rash-none, lesions- none, excoriation- none Lymphadenopathy- none Head- atraumatic            Eyes- Gross vision intact, PERRLA, conjunctivae and secretions clear            Ears- Hearing, canals-normal            Nose- Clear, no-Septal dev, mucus, polyps, erosion, perforation             Throat- Mallampati IV , mucosa clear , drainage- none, tonsils- atrophic Neck- flexible , trachea midline, no stridor , thyroid nl, carotid no bruit Chest - symmetrical excursion , unlabored           Heart/CV- RRR , no murmur , no gallop  , no rub, nl s1 s2                           - JVD- none , edema+1-2+, stasis changes- none, varices- none           Lung- clear to P&A, wheeze- none, cough- none ,  dullness-none, rub- none           Chest wall-  Abd-  Br/ Gen/ Rectal- Not done, not indicated Extrem- cyanosis- none, clubbing, none, atrophy- none, strength- nl Neuro- grossly intact to observation

## 2020-06-15 ENCOUNTER — Other Ambulatory Visit: Payer: Self-pay

## 2020-06-15 ENCOUNTER — Ambulatory Visit (INDEPENDENT_AMBULATORY_CARE_PROVIDER_SITE_OTHER): Payer: 59

## 2020-06-15 ENCOUNTER — Encounter: Payer: Self-pay | Admitting: Internal Medicine

## 2020-06-15 ENCOUNTER — Ambulatory Visit (INDEPENDENT_AMBULATORY_CARE_PROVIDER_SITE_OTHER): Payer: 59 | Admitting: Internal Medicine

## 2020-06-15 VITALS — BP 124/82 | HR 88 | Temp 97.3°F | Ht 70.0 in | Wt 314.0 lb

## 2020-06-15 DIAGNOSIS — G4733 Obstructive sleep apnea (adult) (pediatric): Secondary | ICD-10-CM | POA: Diagnosis not present

## 2020-06-15 DIAGNOSIS — R0600 Dyspnea, unspecified: Secondary | ICD-10-CM

## 2020-06-15 DIAGNOSIS — R0609 Other forms of dyspnea: Secondary | ICD-10-CM

## 2020-06-15 NOTE — Patient Instructions (Signed)
You are doing very well with your CPAP. We can continue auto 10-20  Order- CXR- dx Dyspnea on exertion  Try to walk more and eat less so you can lose some weight. This will really help your breathing too.   Please call if we can help

## 2020-06-16 ENCOUNTER — Encounter: Payer: Self-pay | Admitting: *Deleted

## 2020-07-15 ENCOUNTER — Other Ambulatory Visit: Payer: Self-pay | Admitting: Internal Medicine

## 2020-07-19 ENCOUNTER — Other Ambulatory Visit: Payer: Self-pay | Admitting: Internal Medicine

## 2020-08-01 ENCOUNTER — Other Ambulatory Visit: Payer: Self-pay | Admitting: Internal Medicine

## 2020-08-01 ENCOUNTER — Telehealth: Payer: Self-pay | Admitting: Internal Medicine

## 2020-08-01 DIAGNOSIS — G4733 Obstructive sleep apnea (adult) (pediatric): Secondary | ICD-10-CM

## 2020-08-01 NOTE — Telephone Encounter (Signed)
The wife Annetta, who is on DPR, called requesting CPAP supplies be ordered from [place] for both him and her. I sent in order for CPAP supplies for both patients to [facility]. Patient verbalized understanding, nothing further needed.

## 2020-08-08 ENCOUNTER — Ambulatory Visit (INDEPENDENT_AMBULATORY_CARE_PROVIDER_SITE_OTHER): Payer: 59 | Admitting: Internal Medicine

## 2020-08-08 ENCOUNTER — Encounter: Payer: Self-pay | Admitting: Internal Medicine

## 2020-08-08 ENCOUNTER — Other Ambulatory Visit: Payer: Self-pay

## 2020-08-08 VITALS — BP 132/70 | HR 80 | Temp 98.0°F | Resp 18 | Ht 70.0 in | Wt 312.4 lb

## 2020-08-08 DIAGNOSIS — Z0000 Encounter for general adult medical examination without abnormal findings: Secondary | ICD-10-CM

## 2020-08-08 DIAGNOSIS — I10 Essential (primary) hypertension: Secondary | ICD-10-CM | POA: Diagnosis not present

## 2020-08-08 DIAGNOSIS — G4733 Obstructive sleep apnea (adult) (pediatric): Secondary | ICD-10-CM

## 2020-08-08 DIAGNOSIS — Z86010 Personal history of colonic polyps: Secondary | ICD-10-CM

## 2020-08-08 DIAGNOSIS — E1169 Type 2 diabetes mellitus with other specified complication: Secondary | ICD-10-CM

## 2020-08-08 DIAGNOSIS — M1A9XX0 Chronic gout, unspecified, without tophus (tophi): Secondary | ICD-10-CM | POA: Diagnosis not present

## 2020-08-08 LAB — COMPREHENSIVE METABOLIC PANEL
ALT: 31 U/L (ref 0–53)
AST: 20 U/L (ref 0–37)
Albumin: 4.4 g/dL (ref 3.5–5.2)
Alkaline Phosphatase: 66 U/L (ref 39–117)
BUN: 10 mg/dL (ref 6–23)
CO2: 27 mEq/L (ref 19–32)
Calcium: 9.6 mg/dL (ref 8.4–10.5)
Chloride: 104 mEq/L (ref 96–112)
Creatinine, Ser: 1 mg/dL (ref 0.40–1.50)
GFR: 84.56 mL/min (ref >60.00)
Glucose, Bld: 110 mg/dL — ABNORMAL HIGH (ref 70–99)
Potassium: 4.3 mEq/L (ref 3.5–5.1)
Sodium: 139 mEq/L (ref 135–145)
Total Bilirubin: 1 mg/dL (ref 0.2–1.2)
Total Protein: 7.1 g/dL (ref 6.0–8.3)

## 2020-08-08 LAB — CBC
HCT: 36.7 % — ABNORMAL LOW (ref 39.0–52.0)
Hemoglobin: 11.8 g/dL — ABNORMAL LOW (ref 13.0–17.0)
MCHC: 32.3 g/dL (ref 30.0–36.0)
MCV: 68.2 fl — ABNORMAL LOW (ref 78.0–100.0)
Platelets: 222 10*3/uL (ref 150.0–400.0)
RBC: 5.38 Mil/uL (ref 4.22–5.81)
RDW: 18.2 % — ABNORMAL HIGH (ref 11.5–15.5)
WBC: 10.2 10*3/uL (ref 4.0–10.5)

## 2020-08-08 LAB — HEMOGLOBIN A1C: Hgb A1c MFr Bld: 6 % (ref 4.6–6.5)

## 2020-08-08 LAB — LIPID PANEL
Cholesterol: 114 mg/dL (ref 0–200)
HDL: 39.5 mg/dL (ref >39.00)
LDL Cholesterol: 62 mg/dL (ref 0–99)
NonHDL: 74.87
Total CHOL/HDL Ratio: 3
Triglycerides: 66 mg/dL (ref 0.0–149.0)
VLDL: 13.2 mg/dL (ref 0.0–40.0)

## 2020-08-08 LAB — MICROALBUMIN / CREATININE URINE RATIO
Creatinine,U: 88.9 mg/dL
Microalb Creat Ratio: 0.8 mg/g (ref 0.0–30.0)
Microalb, Ur: <0.7 mg/dL (ref 0.0–1.9)

## 2020-08-08 LAB — URIC ACID: Uric Acid, Serum: 4.1 mg/dL (ref 4.0–7.8)

## 2020-08-08 NOTE — Progress Notes (Signed)
   Subjective:   Patient ID: Randy Hendrix, male    DOB: March 31, 1965, 55 y.o.   MRN: [id number]  HPI The patient is a 55 YO man coming in for physical.   PMH, [place], social history reviewed and updated  Review of Systems  Constitutional: Negative.   HENT: Negative.    Eyes: Negative.   Respiratory:  Negative for cough, chest tightness and shortness of breath.   Cardiovascular:  Negative for chest pain, palpitations and leg swelling.  Gastrointestinal:  Negative for abdominal distention, abdominal pain, constipation, diarrhea, nausea and vomiting.  Musculoskeletal:  Positive for arthralgias.  Skin: Negative.   Neurological: Negative.   Psychiatric/Behavioral: Negative.     Objective:  Physical Exam Constitutional:      Appearance: He is well-developed. He is obese.  HENT:     Head: Normocephalic and atraumatic.  Cardiovascular:     Rate and Rhythm: Normal rate and regular rhythm.  Pulmonary:     Effort: Pulmonary effort is normal. No respiratory distress.     Breath sounds: Normal breath sounds. No wheezing or rales.  Abdominal:     General: Bowel sounds are normal. There is no distension.     Palpations: Abdomen is soft.     Tenderness: There is no abdominal tenderness. There is no rebound.  Musculoskeletal:     Cervical back: Normal range of motion.  Skin:    General: Skin is warm and dry.  Neurological:     Mental Status: He is alert and oriented to person, place, and time.     Coordination: Coordination normal.    Vitals:   08/08/20 0800  BP: 132/70  Pulse: 80  Resp: 18  Temp: 98 F (36.7 C)  TempSrc: Oral  SpO2: 96%  Weight: (!) 312 lb 6.4 oz (141.7 kg)  Height: 5\' 10"  (1.778 m)    This visit occurred during the SARS-CoV-2 public health emergency.  Safety protocols were in place, including screening questions prior to the visit, additional usage of staff PPE, and extensive cleaning of exam room while observing appropriate contact time as indicated for  disinfecting solutions.   Assessment & Plan:

## 2020-08-08 NOTE — Assessment & Plan Note (Signed)
Weight up 3 pounds since December and not exercising. Counseled to resume regular exercise to help.

## 2020-08-08 NOTE — Assessment & Plan Note (Signed)
BP at goal on losartan 50 mg daily. Checking CMP and adjust as needed. 

## 2020-08-08 NOTE — Patient Instructions (Signed)
We will check the labs today. 

## 2020-08-08 NOTE — Assessment & Plan Note (Signed)
Using CPAP and doing well.

## 2020-08-08 NOTE — Assessment & Plan Note (Signed)
Checking uric acid and adjust allopurinol 100 mg daily as needed. No recent flares.

## 2020-08-08 NOTE — Assessment & Plan Note (Signed)
Complicated by hypertension. Checking HgA1c, microalbumin to creatinine ratio. Adjust jardiance and metformin as needed. Is on ARB not statin. Checking lipid panel.

## 2020-08-08 NOTE — Assessment & Plan Note (Signed)
Flu shot yearly. Covid-19 3 shots counseled about booster. Pneumonia up to date until 74. Shingrix complete. Tetanus up to date. Colonoscopy referral to GI as due. Counseled about sun safety and mole surveillance. Counseled about the dangers of distracted driving. Given 10 year screening recommendations.

## 2020-08-11 ENCOUNTER — Other Ambulatory Visit: Payer: Self-pay

## 2020-08-11 ENCOUNTER — Ambulatory Visit (INDEPENDENT_AMBULATORY_CARE_PROVIDER_SITE_OTHER): Payer: 59 | Admitting: Podiatry

## 2020-08-11 DIAGNOSIS — E119 Type 2 diabetes mellitus without complications: Secondary | ICD-10-CM

## 2020-08-11 DIAGNOSIS — B351 Tinea unguium: Secondary | ICD-10-CM

## 2020-08-11 DIAGNOSIS — M79675 Pain in left toe(s): Secondary | ICD-10-CM | POA: Diagnosis not present

## 2020-08-11 DIAGNOSIS — M79674 Pain in right toe(s): Secondary | ICD-10-CM

## 2020-08-13 ENCOUNTER — Encounter: Payer: Self-pay | Admitting: Podiatry

## 2020-08-13 NOTE — Progress Notes (Signed)
  Subjective:  Patient ID: Randy Hendrix, male    DOB: 02/12/1966,  MRN: [id number]  Randy Hendrix presents to clinic today for preventative diabetic foot care and thick, elongated toenails b/l feet which are tender when wearing enclosed shoe gear.  Patient states blood glucose was 105 mg/dl on yesterday.  He voices no new pedal concerns on today's visit.  PCP is Randy Koch, MD , and last visit was 08/08/2020.  No Known Allergies  Review of Systems: Negative except as noted in the HPI. Objective:   [name] is a pleasant 55 y.o. African American male, morbidly obese in NAD. AAO x 3.   Vascular Capillary refill time to digits immediate b/l. Palpable pedal pulses b/l LE. Pedal hair sparse. Lower extremity skin temperature gradient within normal limits. No pain with calf compression b/l. No cyanosis or clubbing noted.  Neurologic Normal speech. Oriented to person, place, and time. Protective sensation intact 5/5 intact bilaterally with 10g monofilament b/l. Vibratory sensation intact b/l.  Dermatologic Pedal skin with normal turgor, texture and tone b/l lower extremities No open wounds b/l lower extremities No interdigital macerations b/l lower extremities Toenails 1-5 b/l elongated, discolored, dystrophic, thickened, crumbly with subungual debris and tenderness to dorsal palpation.  Orthopedic: Normal muscle strength 5/5 to all lower extremity muscle groups bilaterally. No pain crepitus or joint limitation noted with ROM b/l. Pes planus deformity noted b/l.    Radiographs: None Assessment:   1. Pain due to onychomycosis of toenails of both feet   2. Type 2 diabetes mellitus without complication, without long-term current use of insulin ([place])    Plan:  Patient was evaluated and treated and all questions answered.  Onychomycosis with pain -Nails palliatively debridement as below -Educated on self-care  Procedure: Nail  Debridement Rationale: Pain Type of Debridement: manual, sharp debridement. Instrumentation: Nail nipper, rotary burr. Number of Nails: 10 -Examined patient. -Continue diabetic foot care principles. -Patient to continue soft, supportive shoe gear daily. -Toenails 1-5 b/l were debrided in length and girth with sterile nail nippers and dremel without iatrogenic bleeding.  -Patient to report any pedal injuries to medical professional immediately. -Patient/POA to call should there be question/concern in the interim.  No follow-ups on file.  Marzetta Board, DPM

## 2020-09-26 ENCOUNTER — Encounter: Payer: Self-pay | Admitting: Internal Medicine

## 2020-09-26 DIAGNOSIS — R0600 Dyspnea, unspecified: Secondary | ICD-10-CM | POA: Insufficient documentation

## 2020-09-26 DIAGNOSIS — R0609 Other forms of dyspnea: Secondary | ICD-10-CM | POA: Insufficient documentation

## 2020-09-26 NOTE — Assessment & Plan Note (Signed)
May just be obesity and deconditioning Plan- CXR

## 2020-09-26 NOTE — Assessment & Plan Note (Signed)
Benefits from CPAP Plan- continue auto 10-20

## 2020-10-01 ENCOUNTER — Other Ambulatory Visit: Payer: Self-pay | Admitting: Internal Medicine

## 2020-10-18 ENCOUNTER — Encounter: Payer: Self-pay | Admitting: Gastroenterology

## 2020-10-19 ENCOUNTER — Encounter: Payer: Self-pay | Admitting: Physician Assistant

## 2020-10-19 ENCOUNTER — Ambulatory Visit (INDEPENDENT_AMBULATORY_CARE_PROVIDER_SITE_OTHER): Payer: 59 | Admitting: Physician Assistant

## 2020-10-19 VITALS — BP 142/90 | HR 84 | Ht 68.0 in | Wt 316.4 lb

## 2020-10-19 DIAGNOSIS — R194 Change in bowel habit: Secondary | ICD-10-CM | POA: Diagnosis not present

## 2020-10-19 DIAGNOSIS — Z86010 Personal history of colonic polyps: Secondary | ICD-10-CM | POA: Diagnosis not present

## 2020-10-19 DIAGNOSIS — K219 Gastro-esophageal reflux disease without esophagitis: Secondary | ICD-10-CM

## 2020-10-19 DIAGNOSIS — R1013 Epigastric pain: Secondary | ICD-10-CM

## 2020-10-19 MED ORDER — OMEPRAZOLE 20 MG PO CPDR: 20.0000 mg | DELAYED_RELEASE_CAPSULE | Freq: Every day | ORAL | 3 refills | Status: DC

## 2020-10-19 MED ORDER — DICYCLOMINE HCL 10 MG PO CAPS: 10.0000 mg | ORAL_CAPSULE | Freq: Two times a day (BID) | ORAL | 5 refills | Status: DC

## 2020-10-19 MED ORDER — CLENPIQ 10-3.5-12 MG-GM -GM/160ML PO SOLN: 1.0000 | Freq: Once | ORAL | 0 refills | Status: AC

## 2020-10-19 NOTE — Progress Notes (Signed)
Chief Complaint: Abdominal pain, bloating and diarrhea  HPI:    Randy Hendrix is a 55 year old African-American male with a past medical history of reflux, obesity and others listed below, known to Dr. Adela Lank, who was referred to me by Myrlene Broker, * for a complaint of abdominal pain, bloating and diarrhea.    06/26/2015 colonoscopy with 1 small tubular adenoma and diverticulosis as well as internal hemorrhoids.  Repeat recommended in 5 years.    12/01/2017 patient met with Dr. Adela Lank for IBS symptoms.  Had a CT the abdomen pelvis 09/24/2017 with mild wall thickening of the bladder, borderline right inguinal lymphadenopathy, mildly prominent peripancreatic lymph nodes, suspected renal cysts, sigmoid diverticulosis and lumbar impingement.  Also noted chronic microcytic anemia.  That time discussed postinfectious IBS.  Continued IBgard as it was helping.  Also recommended the low FODMAP diet.  Also started on Citrucel.  Recommended repeat CT scan in 1 to 2 months to check on lymphadenopathy.    03/10/2018 patient seen for 3 days of painless hematochezia.  Only minimally inflamed internal hemorrhoids on anoscopy.  It was thought patient had a diverticular hemorrhage.  Recheck CBC.    Today, the patient presents to clinic accompanied by his wife who does assist with history.  He explains that ever since 2016 and having the "gastrointestinal flu", he has had symptoms off and on of some periumbilical/epigastric pain and cramping throughout his abdomen with radiation between diarrhea and constipation with a lot of excessive gas.  Does not necessarily remember being on IBgard before but tells me he does have some at home.  Tells me that symptoms have been exacerbated lately and at least 3 to 4 days out of the week he will complain of pain.  His wife is the one who made the appointment because "something is always happening with him or he does not feel like eating".  Also describes reflux symptoms off  and on.    Denies fever, chills, weight loss, blood in his stool, nausea, vomiting or symptoms that awaken him from sleep.  Past Medical History:  Diagnosis Date   Allergic rhinitis    ALLERGIC RHINITIS    Allergy    Gastroenteritis    GERD (gastroesophageal reflux disease)    occasionally   Gout    Hemorrhoid    Kidney stone on right side 05/2004   OBESITY, CLASS II    OSA (obstructive sleep apnea)    CPAP qhs   Oxygen deficiency    PATELLAR DISLOCATION, RIGHT    Sleep apnea    wears cpap    Past Surgical History:  Procedure Laterality Date   HEMORRHOID SURGERY     WISDOM TOOTH EXTRACTION      Current Outpatient Medications  Medication Sig Dispense Refill   allopurinol (ZYLOPRIM) 100 MG tablet TAKE 1 TABLET(100 MG) BY MOUTH DAILY 90 tablet 3   azelastine (ASTELIN) 0.1 % nasal spray Place 2 sprays into both nostrils 2 (two) times daily as needed. 30 mL 6   Cyanocobalamin (VITAMIN B 12 PO) Take 1 tablet by mouth daily.     fexofenadine (ALLEGRA) 180 MG tablet Take 180 mg by mouth daily.     gabapentin (NEURONTIN) 300 MG capsule Take 1 capsule (300 mg total) by mouth at bedtime. 90 capsule 3   Garcinia Cambogia-Chromium 500-200 MG-MCG TABS Take 1 capsule by mouth daily.     glucose blood (ACCU-CHEK GUIDE) test strip TEST FOUR TIMES DAILY AS DIRECTED 100 strip 2  Green Tea, Camillia sinensis, (GREEN TEA PO) Take 1 capsule by mouth daily.     JARDIANCE 25 MG TABS tablet TAKE 1 TABLET(25 MG) BY MOUTH DAILY 90 tablet 3   Lancets (ONETOUCH DELICA PLUS [id number]) MISC USE TO CHECK SUGARS 4 TIMES DAILY 400 each 3   losartan (COZAAR) 50 MG tablet TAKE 1 TABLET(50 MG) BY MOUTH DAILY 90 tablet 3   meloxicam (MOBIC) 15 MG tablet TAKE 1 TABLET(15 MG) BY MOUTH DAILY WITH FOOD 30 tablet 2   metFORMIN (GLUCOPHAGE) 1000 MG tablet TAKE 1 TABLET(1000 MG) BY MOUTH TWICE DAILY 180 tablet 3   methylcellulose (CITRUCEL) oral powder Citrucel: use as directed once daily     mometasone (NASONEX) 50  MCG/ACT nasal spray Place 2 sprays into the nose daily.  1   Multiple Vitamin (MULTIVITAMIN) capsule Take 1 capsule by mouth daily.     SYSTANE ULTRA 0.4-0.3 % SOLN SMARTSIG:1 Drop(s) In Eye(s) PRN     VITAMIN D PO Take by mouth.     No current facility-administered medications for this visit.    Allergies as of 10/19/2020   (No Known Allergies)    Family History  Problem Relation Age of Onset   Diabetes Mother    Anemia Mother    Immunodeficiency Mother    Stroke Father    Dementia Father    Hypertension Father    Stomach cancer Maternal Grandmother    Colon cancer Neg Hx    Prostate cancer Neg Hx    Cancer Neg Hx    Heart disease Neg Hx    COPD Neg Hx    Colon polyps Neg Hx    Esophageal cancer Neg Hx    Rectal cancer Neg Hx     Social History   Socioeconomic History   Marital status: Married    Spouse name: Annetta   Number of children: 2   Years of education: 12   Highest education level: Not on file  Occupational History   Occupation: k Chiropodist in maintenance    Employer: [employer]  Tobacco Use   Smoking status: Never   Smokeless tobacco: Never  Vaping Use   Vaping Use: Never used  Substance and Sexual Activity   Alcohol use: No    Alcohol/week: 0.0 standard drinks   Drug use: No   Sexual activity: Yes    Partners: Female  Other Topics Concern   Not on file  Social History Narrative   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 Married 1990. 1 daughter 2002 and 1 son 61. SO-multiple medical problems- but currently stable. Daughter w/ petit mal seizures. Marriage is in good health.     Social Determinants of Health   Financial Resource Strain: Not on file  Food Insecurity: Not on file  Transportation Needs: Not on file  Physical Activity: Not on file  Stress: Not on file  Social Connections: Not on file  Intimate Partner Violence: Not on file    Review of Systems:    Constitutional: No weight loss, fever or  chills Cardiovascular: No chest pain Respiratory: No SOB  Gastrointestinal: See HPI and otherwise negative   Physical Exam:  Vital signs: BP (!) 142/90 (BP Location: Left Arm, Patient Position: Sitting, Cuff Size: Large)   Pulse 84   Ht $R'5\' 8"'Ag$  (1.727 m) Comment: height measured without shoes  Wt (!) 316 lb 6 oz (143.5 kg)   BMI 48.10 kg/m    Constitutional:   Pleasant Obese AA male  appears to be in NAD, Well developed, Well nourished, alert and cooperative Head:  Normocephalic and atraumatic. Eyes:   PEERL, EOMI. No icterus. Conjunctiva pink. Ears:  Normal auditory acuity. Neck:  Supple Throat: Oral cavity and pharynx without inflammation, swelling or lesion.  Respiratory: Respirations even and unlabored. Lungs clear to auscultation bilaterally.   No wheezes, crackles, or rhonchi.  Cardiovascular: Normal S1, S2. No MRG. Regular rate and rhythm. No peripheral edema, cyanosis or pallor.  Gastrointestinal:  Soft, nondistended, moderate epigastric ttp, No rebound or guarding. Normal bowel sounds. No appreciable masses or hepatomegaly. Rectal:  Not performed.  Msk:  Symmetrical without gross deformities. Without edema, no deformity or joint abnormality.  Neurologic:  Alert and  oriented x4;  grossly normal neurologically.  Skin:   Dry and intact without significant lesions or rashes. Psychiatric: Demonstrates good judgement and reason without abnormal affect or behaviors.  RELEVANT LABS AND IMAGING: CBC    Component Value Date/Time   WBC 10.2 08/08/2020 0833   RBC 5.38 08/08/2020 0833   HGB 11.8 (L) 08/08/2020 0833   HCT 36.7 (L) 08/08/2020 0833   PLT 222.0 08/08/2020 0833   MCV 68.2 Repeated and verified X2. (L) 08/08/2020 0833   MCH 21.3 (L) 07/04/2018 1659   MCHC 32.3 08/08/2020 0833   RDW 18.2 (H) 08/08/2020 0833   LYMPHSABS 2.0 08/02/2016 0924   MONOABS 0.6 08/02/2016 0924   EOSABS 0.2 08/02/2016 0924   BASOSABS 0.1 08/02/2016 0924    CMP     Component Value Date/Time    NA 139 08/08/2020 0833   K 4.3 08/08/2020 0833   CL 104 08/08/2020 0833   CO2 27 08/08/2020 0833   GLUCOSE 110 (H) 08/08/2020 0833   GLUCOSE 103 (H) 02/03/2006 0738   BUN 10 08/08/2020 0833   CREATININE 1.00 08/08/2020 0833   CALCIUM 9.6 08/08/2020 0833   PROT 7.1 08/08/2020 0833   ALBUMIN 4.4 08/08/2020 0833   AST 20 08/08/2020 0833   ALT 31 08/08/2020 0833   ALKPHOS 66 08/08/2020 0833   BILITOT 1.0 08/08/2020 0833   GFRNONAA >60 07/04/2018 1659   GFRAA >60 07/04/2018 1659    Assessment: 1.  Epigastric pain: At time of exam today; consider underlying gastritis+/- GERD likely exacerbated by obesity 2.  Generalized abdominal pain: Cramping sensation 3 to 4 days out of the week with diarrhea at times; likely IBS 3.  Change in bowel habits: Radiates from diarrhea to constipation; likely IBS 4.  History of adenomatous polyp of the colon: Last colonoscopy in 2017 with recommendations to repeat in 5 years  Plan: 1.  Scheduled patient for a diagnostic EGD and colonoscopy with Dr. Havery Moros in the [facility].  Did provide the patient with a detailed list of risks for the procedures and he agrees to proceed. Patient is appropriate for endoscopic procedure(s) in the ambulatory ([place]) setting.  2.  Discussed with patient that his symptoms likely represent IBS +/- gastritis.  Started the patient on Dicyclomine 10 mg twice daily, 20-30 minutes before breakfast and dinner.  #60 with 3 refills. 3.  Also prescribed Omeprazole 20 mg once daily, 30-60 minutes before breakfast #30 with 5 refills. 4.  Patient to follow in clinic per recommendations from Dr. Havery Moros after time of procedures.  Ellouise Newer, PA-C [facility] 10/19/2020, 9:12 AM  Cc: Hoyt Koch, *

## 2020-10-19 NOTE — Patient Instructions (Signed)
We have sent the following medications to your pharmacy for you to pick up at your convenience: Dicyclomine 10 mg twice daily 20-30 minutes before breakfast and dinner. Omeprazole 20 mg daily 30-60 minutes before breakfast.   You have been scheduled for an endoscopy and colonoscopy. Please follow the written instructions given to you at your visit today. Please pick up your prep supplies at the pharmacy within the next 1-3 days. If you use inhalers (even only as needed), please bring them with you on the day of your procedure.  If you are age 58 or older, your body mass index should be between 23-30. Your Body mass index is 48.1 kg/m. If this is out of the aforementioned range listed, please consider follow up with your Primary Care Provider.  If you are age 67 or younger, your body mass index should be between 19-25. Your Body mass index is 48.1 kg/m. If this is out of the aformentioned range listed, please consider follow up with your Primary Care Provider.   __________________________________________________________  The [facility] providers would like to encourage you to use [facility] to communicate with providers for non-urgent requests or questions.  Due to long hold times on the telephone, sending your provider a message by [facility] may be a faster and more efficient way to get a response.  Please allow 48 business hours for a response.  Please remember that this is for non-urgent requests.

## 2020-10-19 NOTE — Progress Notes (Signed)
Agree with assessment and plan as outlined.  

## 2020-11-03 ENCOUNTER — Ambulatory Visit (INDEPENDENT_AMBULATORY_CARE_PROVIDER_SITE_OTHER): Payer: 59 | Admitting: Podiatry

## 2020-11-03 ENCOUNTER — Other Ambulatory Visit: Payer: Self-pay

## 2020-11-03 DIAGNOSIS — M79674 Pain in right toe(s): Secondary | ICD-10-CM

## 2020-11-03 DIAGNOSIS — M79675 Pain in left toe(s): Secondary | ICD-10-CM | POA: Diagnosis not present

## 2020-11-03 DIAGNOSIS — E119 Type 2 diabetes mellitus without complications: Secondary | ICD-10-CM

## 2020-11-03 DIAGNOSIS — B351 Tinea unguium: Secondary | ICD-10-CM | POA: Diagnosis not present

## 2020-11-09 ENCOUNTER — Encounter: Payer: Self-pay | Admitting: Podiatry

## 2020-11-09 NOTE — Progress Notes (Signed)
  Subjective:  Patient ID: Randy Hendrix, male    DOB: 1965-03-14,  MRN: [id number]  Hubert Raatz presents to clinic today for preventative diabetic foot care and thick, elongated toenails b/l lower extremities which are tender when wearing enclosed shoe gear.  Patient states blood glucose averages between 100-110 mg/dl daily.  PCP is Hoyt Koch, MD , and last visit was 08/08/2020.  No Known Allergies  Review of Systems: Negative except as noted in the HPI. Objective:   Constitutional Nial Hawe is a pleasant 55 y.o. African American male, morbidly obese in NAD. AAO x 3.   Vascular Capillary refill time to digits immediate b/l. Palpable DP pulse(s) b/l lower extremities Palpable PT pulse(s) b/l lower extremities Pedal hair sparse. Lower extremity skin temperature gradient within normal limits. No pain with calf compression b/l. No edema noted b/l lower extremities. No cyanosis or clubbing noted.  Neurologic Normal speech. Oriented to person, place, and time. Protective sensation intact 5/5 intact bilaterally with 10g monofilament b/l. Vibratory sensation intact b/l.  Dermatologic Skin warm and supple b/l lower extremities. No open wounds b/l lower extremities. No interdigital macerations b/l lower extremities. Toenails 1-5 b/l elongated, discolored, dystrophic, thickened, crumbly with subungual debris and tenderness to dorsal palpation.  Orthopedic: Normal muscle strength 5/5 to all lower extremity muscle groups bilaterally. No pain crepitus or joint limitation noted with ROM b/l lower extremities. Pes planus deformity noted b/l lower extremities.   Radiographs: None Assessment:   1. Pain due to onychomycosis of toenails of both feet   2. Type 2 diabetes mellitus without complication, without long-term current use of insulin ([place])    Plan:  Patient was evaluated and treated and all questions answered. Consent given for treatment as described  below: -Examined patient. -Continue diabetic foot care principles: inspect feet daily, monitor glucose as recommended by PCP and/or Endocrinologist, and follow prescribed diet per PCP, Endocrinologist and/or dietician. -Patient to continue soft, supportive shoe gear daily. -Toenails 1-5 b/l were debrided in length and girth with sterile nail nippers and dremel without iatrogenic bleeding.  -Patient to report any pedal injuries to medical professional immediately. -Patient/POA to call should there be question/concern in the interim.  Return in about 3 months (around 02/02/2021).  Marzetta Board, DPM

## 2020-12-14 ENCOUNTER — Encounter: Payer: Self-pay | Admitting: Gastroenterology

## 2020-12-14 ENCOUNTER — Ambulatory Visit (AMBULATORY_SURGERY_CENTER): Payer: 59 | Admitting: Gastroenterology

## 2020-12-14 VITALS — BP 141/85 | HR 77 | Temp 98.4°F | Resp 12 | Ht 68.0 in | Wt 316.0 lb

## 2020-12-14 DIAGNOSIS — R109 Unspecified abdominal pain: Secondary | ICD-10-CM

## 2020-12-14 DIAGNOSIS — K219 Gastro-esophageal reflux disease without esophagitis: Secondary | ICD-10-CM | POA: Diagnosis not present

## 2020-12-14 DIAGNOSIS — K449 Diaphragmatic hernia without obstruction or gangrene: Secondary | ICD-10-CM | POA: Diagnosis not present

## 2020-12-14 DIAGNOSIS — Z8719 Personal history of other diseases of the digestive system: Secondary | ICD-10-CM

## 2020-12-14 DIAGNOSIS — R194 Change in bowel habit: Secondary | ICD-10-CM

## 2020-12-14 DIAGNOSIS — K5730 Diverticulosis of large intestine without perforation or abscess without bleeding: Secondary | ICD-10-CM

## 2020-12-14 DIAGNOSIS — D123 Benign neoplasm of transverse colon: Secondary | ICD-10-CM

## 2020-12-14 DIAGNOSIS — K648 Other hemorrhoids: Secondary | ICD-10-CM

## 2020-12-14 DIAGNOSIS — Z860100 Personal history of colon polyps, unspecified: Secondary | ICD-10-CM

## 2020-12-14 DIAGNOSIS — D125 Benign neoplasm of sigmoid colon: Secondary | ICD-10-CM | POA: Diagnosis not present

## 2020-12-14 DIAGNOSIS — R1013 Epigastric pain: Secondary | ICD-10-CM | POA: Diagnosis not present

## 2020-12-14 DIAGNOSIS — K2970 Gastritis, unspecified, without bleeding: Secondary | ICD-10-CM

## 2020-12-14 MED ORDER — SODIUM CHLORIDE 0.9 % IV SOLN
500.0000 mL | Freq: Once | INTRAVENOUS | Status: DC
Start: 2020-12-14 — End: 2020-12-14

## 2020-12-14 NOTE — Op Note (Signed)
[place] Patient Name: Randy Hendrix Procedure Date: 12/14/2020 2:15 PM MRN: [id number] Endoscopist: Remo Lipps P. Havery Moros , MD Age: 55 Referring MD:  Date of Birth: Aug 03, 1965 Gender: Male Account #: [id number] Procedure:                Colonoscopy Indications:              Lower abdominal pain, Change in bowel habits -                            history of symptoms since gastroenteritis years ago                            but persistent, history of adenoma removed in 06/2015 Medicines:                Monitored Anesthesia Care Procedure:                Pre-Anesthesia Assessment:                           - Prior to the procedure, a History and Physical                            was performed, and patient medications and                            allergies were reviewed. The patient's tolerance of                            previous anesthesia was also reviewed. The risks                            and benefits of the procedure and the sedation                            options and risks were discussed with the patient.                            All questions were answered, and informed consent                            was obtained. Prior Anticoagulants: The patient has                            taken no previous anticoagulant or antiplatelet                            agents. ASA Grade Assessment: III - A patient with                            severe systemic disease. After reviewing the risks                            and benefits, the patient was deemed in  satisfactory condition to undergo the procedure.                           After obtaining informed consent, the colonoscope                            was passed under direct vision. Throughout the                            procedure, the patient's blood pressure, pulse, and                            oxygen saturations were monitored continuously. The                             CF [id number] #[id number] was introduced through the anus                            and advanced to the the cecum, identified by                            appendiceal orifice and ileocecal valve. The                            colonoscopy was performed without difficulty. The                            patient tolerated the procedure well. The quality                            of the bowel preparation was adequate. The terminal                            ileum, ileocecal valve, appendiceal orifice, and                            rectum were photographed. Scope In: 2:36:02 PM Scope Out: 2:57:11 PM Scope Withdrawal Time: 0 hours 17 minutes 16 seconds  Total Procedure Duration: 0 hours 21 minutes 9 seconds  Findings:                 The perianal and digital rectal examinations were                            normal.                           Limited views of the distal terminal ileum appeared                            normal.                           A 3 to 4 mm polyp was found in the transverse  colon. The polyp was flat. The polyp was removed                            with a cold snare. Resection and retrieval were                            complete.                           A 3 mm polyp was found in the sigmoid colon. The                            polyp was sessile. The polyp was removed with a                            cold snare. Resection and retrieval were complete.                           Multiple small-mouthed diverticula were found in                            the transverse colon and left colon.                           Internal hemorrhoids were found during retroflexion.                           The exam was otherwise without abnormality. No                            inflammatory changes Complications:            No immediate complications. Estimated blood loss:                            Minimal. Estimated Blood Loss:     Estimated blood  loss was minimal. Impression:               - Limited views of the distal ileum were normal.                           - One 3 to 4 mm polyp in the transverse colon,                            removed with a cold snare. Resected and retrieved.                           - One 3 mm polyp in the sigmoid colon, removed with                            a cold snare. Resected and retrieved.                           - Diverticulosis in the transverse colon and in the  left colon.                           - Internal hemorrhoids.                           - The examination was otherwise normal.                           No overt inflammatory changes. Suspect patient has                            a functional bowel change post gastroenteritis,                            post infectious IBS. Recommendation:           - Patient has a contact number available for                            emergencies. The signs and symptoms of potential                            delayed complications were discussed with the                            patient. Return to normal activities tomorrow.                            Written discharge instructions were provided to the                            patient.                           - Resume previous diet.                           - Continue present medications.                           - Trial of Citrucel once daily of not yet done                           - Bentyl PRN (patient has not tried yet)                           - Await pathology results. Remo Lipps P. Sangeeta Youse, MD 12/14/2020 3:03:45 PM This report has been signed electronically.

## 2020-12-14 NOTE — Patient Instructions (Signed)
You may take the omeprazole 20 mg daily as needed if not tried yet. Take the Bentyl as needed if you haven't tried it yet. Take an over the counter fiber supplement like Citrucel daily and drink plenty of water with it to help with regularity. Await pathology results.   YOU HAD AN ENDOSCOPIC PROCEDURE TODAY AT [facility]:   Refer to the procedure report that was given to you for any specific questions about what was found during the examination.  If the procedure report does not answer your questions, please call your gastroenterologist to clarify.  If you requested that your care partner not be given the details of your procedure findings, then the procedure report has been included in a sealed envelope for you to review at your convenience later.  YOU SHOULD EXPECT: Some feelings of bloating in the abdomen. Passage of more gas than usual.  Walking can help get rid of the air that was put into your GI tract during the procedure and reduce the bloating. If you had a lower endoscopy (such as a colonoscopy or flexible sigmoidoscopy) you may notice spotting of blood in your stool or on the toilet paper. If you underwent a bowel prep for your procedure, you may not have a normal bowel movement for a few days.  Please Note:  You might notice some irritation and congestion in your nose or some drainage.  This is from the oxygen used during your procedure.  There is no need for concern and it should clear up in a day or so.  SYMPTOMS TO REPORT IMMEDIATELY:  Following lower endoscopy (colonoscopy or flexible sigmoidoscopy):  Excessive amounts of blood in the stool  Significant tenderness or worsening of abdominal pains  Swelling of the abdomen that is new, acute  Fever of 100F or higher  Following upper endoscopy (EGD)  Vomiting of blood or coffee ground material  New chest pain or pain under the shoulder blades  Painful or persistently difficult swallowing  New shortness of  breath  Fever of 100F or higher  Black, tarry-looking stools  For urgent or emergent issues, a gastroenterologist can be reached at any hour by calling [phone]. Do not use MyChart messaging for urgent concerns.    DIET:  We do recommend a small meal at first, but then you may proceed to your regular diet.  Drink plenty of fluids but you should avoid alcoholic beverages for 24 hours.  ACTIVITY:  You should plan to take it easy for the rest of today and you should NOT DRIVE or use heavy machinery until tomorrow (because of the sedation medicines used during the test).    FOLLOW UP: Our staff will call the number listed on your records 48-72 hours following your procedure to check on you and address any questions or concerns that you may have regarding the information given to you following your procedure. If we do not reach you, we will leave a message.  We will attempt to reach you two times.  During this call, we will ask if you have developed any symptoms of COVID 19. If you develop any symptoms (ie: fever, flu-like symptoms, shortness of breath, cough etc.) before then, please call [phone].  If you test positive for Covid 19 in the 2 weeks post procedure, please call and report this information to [place].    If any biopsies were taken you will be contacted by phone or by letter within the next 1-3 weeks.  Please call  us at [phone] if you have not heard about the biopsies in 3 weeks.    SIGNATURES/CONFIDENTIALITY: You and/or your care partner have signed paperwork which will be entered into your electronic medical record.  These signatures attest to the fact that that the information above on your After Visit Summary has been reviewed and is understood.  Full responsibility of the confidentiality of this discharge information lies with you and/or your care-partner.

## 2020-12-14 NOTE — Progress Notes (Signed)
Called to room to assist during endoscopic procedure.  Patient ID and intended procedure confirmed with present staff. Received instructions for my participation in the procedure from the performing physician.  

## 2020-12-14 NOTE — Op Note (Signed)
[facility] Patient Name: Randy Hendrix Procedure Date: 12/14/2020 2:17 PM MRN: [id number] Endoscopist: Remo Lipps P. Havery Moros , MD Age: 55 Referring MD:  Date of Birth: [birth year] Gender: Male Account #: [id number] Procedure:                Upper GI endoscopy Indications:              Epigastric abdominal pain, Follow-up of                            gastro-esophageal reflux disease - prescribed                            omeprazole 20mg  / day trial, has not started it                            yet, symptoms mild / intermittent Medicines:                Monitored Anesthesia Care Procedure:                Pre-Anesthesia Assessment:                           - Prior to the procedure, a History and Physical                            was performed, and patient medications and                            allergies were reviewed. The patient's tolerance of                            previous anesthesia was also reviewed. The risks                            and benefits of the procedure and the sedation                            options and risks were discussed with the patient.                            All questions were answered, and informed consent                            was obtained. Prior Anticoagulants: The patient has                            taken no previous anticoagulant or antiplatelet                            agents. ASA Grade Assessment: III - A patient with                            severe systemic disease. After reviewing the risks  and benefits, the patient was deemed in                            satisfactory condition to undergo the procedure.                           After obtaining informed consent, the endoscope was                            passed under direct vision. Throughout the                            procedure, the patient's blood pressure, pulse, and                            oxygen saturations were  monitored continuously. The                            GIF [id number] #[id number] was introduced through the                            mouth, and advanced to the second part of duodenum.                            The upper GI endoscopy was accomplished without                            difficulty. The patient tolerated the procedure                            well. Scope In: Scope Out: Findings:                 Esophagogastric landmarks were identified: the                            Z-line was found at 43 cm, the gastroesophageal                            junction was found at 43 cm and the upper extent of                            the gastric folds was found at 45 cm from the                            incisors.                           A 2 cm hiatal hernia was present.                           The exam of the esophagus was otherwise normal. Z                            line regular.  Patchy mildly erythematous mucosa was found in the                            gastric body and in the gastric antrum.                           The exam of the stomach was otherwise normal.                           Biopsies were taken with a cold forceps in the                            gastric body, at the incisura and in the gastric                            antrum for Helicobacter pylori testing.                           The duodenal bulb and second portion of the                            duodenum were normal. Complications:            No immediate complications. Estimated blood loss:                            Minimal. Estimated Blood Loss:     Estimated blood loss was minimal. Impression:               - Esophagogastric landmarks identified.                           - 2 cm hiatal hernia.                           - Normal esophagus otherwise                           - Erythematous mucosa in the gastric body and                            antrum.                            - Normal stomach otherwise - biopsies taken to rule                            out H pylori                           - Normal duodenal bulb and second portion of the                            duodenum. Recommendation:           - Patient has a contact number available for  emergencies. The signs and symptoms of potential                            delayed complications were discussed with the                            patient. Return to normal activities tomorrow.                            Written discharge instructions were provided to the                            patient.                           - Resume previous diet.                           - Continue present medications.                           - Trial of omeprazole 20mg  / day as needed if not                            yet tried                           - Await pathology results. Remo Lipps P. Rella Egelston, MD 12/14/2020 3:08:15 PM This report has been signed electronically.

## 2020-12-14 NOTE — Progress Notes (Signed)
To PACU, VSS. Report to [email or web address] 

## 2020-12-14 NOTE — Progress Notes (Signed)
[facility] History and Physical   Primary Care Physician:  Hoyt Koch, MD   Reason for Procedure:   History of GERD, epigastric / lower abdominal pains, altered bowel habits, history of colon polyps  Plan:    EGD and colonoscopy     HPI: Randy Hendrix is a 55 y.o. male  here for further evaluation of symptoms as above. Colonoscopy > 5 years ago showing an adenoma. He has had altered bowel habits ranging from loose stools to constipation associated with abdominal pain following gastroenteritis years ago, suspected IBS but symptoms persisting over time. Given bentyl but has not taken it yet. History of GERD with intermittent epigastric pain, no prior EGD. Given omeprazole at last clinic visit but has not yet taken it.  No family history of colon cancer known in first degree relatives. Otherwise feels well without any cardiopulmonary symptoms. I have discussed risks / benefits of endoscopy, colonoscopy and anesthesia with him and he understands and wishes to proceed.   Past Medical History:  Diagnosis Date   Allergic rhinitis    ALLERGIC RHINITIS    Allergy    Gastroenteritis    GERD (gastroesophageal reflux disease)    occasionally   Gout    Hemorrhoid    Kidney stone on right side 05/2004   OBESITY, CLASS II    OSA (obstructive sleep apnea)    CPAP qhs   Oxygen deficiency    PATELLAR DISLOCATION, RIGHT    Sleep apnea    wears cpap    Past Surgical History:  Procedure Laterality Date   HEMORRHOID SURGERY     WISDOM TOOTH EXTRACTION      Prior to Admission medications   Medication Sig Start Date End Date Taking? Authorizing Provider  allopurinol (ZYLOPRIM) 100 MG tablet TAKE 1 TABLET(100 MG) BY MOUTH DAILY 08/04/20  Yes Hoyt Koch, MD  azelastine (ASTELIN) 0.1 % nasal spray Place 2 sprays into both nostrils 2 (two) times daily as needed. 02/08/20  Yes Hoyt Koch, MD  Cyanocobalamin (VITAMIN B 12 PO) Take 1 tablet by mouth  daily.   Yes [provider]  fexofenadine (ALLEGRA) 180 MG tablet Take 180 mg by mouth daily.   Yes [provider]  gabapentin (NEURONTIN) 300 MG capsule Take 1 capsule (300 mg total) by mouth at bedtime. 02/16/20  Yes Hoyt Koch, MD  Garcinia Cambogia-Chromium 500-200 MG-MCG TABS Take 1 capsule by mouth daily.   Yes [provider]  Eloise Levels, Camillia sinensis, (GREEN TEA PO) Take 1 capsule by mouth daily.   Yes [provider]  JARDIANCE 25 MG TABS tablet TAKE 1 TABLET(25 MG) BY MOUTH DAILY 07/18/20  Yes Hoyt Koch, MD  losartan (COZAAR) 50 MG tablet TAKE 1 TABLET(50 MG) BY MOUTH DAILY 10/04/20  Yes Hoyt Koch, MD  meloxicam (MOBIC) 15 MG tablet TAKE 1 TABLET(15 MG) BY MOUTH DAILY WITH FOOD 03/10/20  Yes Hoyt Koch, MD  metFORMIN (GLUCOPHAGE) 1000 MG tablet TAKE 1 TABLET(1000 MG) BY MOUTH TWICE DAILY 07/19/20  Yes Hoyt Koch, MD  mometasone (NASONEX) 50 MCG/ACT nasal spray Place 2 sprays into the nose daily. 07/17/16  Yes [provider]  Multiple Vitamin (MULTIVITAMIN) capsule Take 1 capsule by mouth daily.   Yes [provider]  omeprazole (PRILOSEC) 20 MG capsule Take 1 capsule (20 mg total) by mouth daily. 10/19/20  Yes Levin Erp, PA  SYSTANE ULTRA 0.4-0.3 % SOLN SMARTSIG:1 Drop(s) In Eye(s) PRN 10/26/19  Yes [provider]  VITAMIN D PO Take by mouth.   Yes [provider]  dicyclomine (BENTYL) 10 MG capsule Take 1 capsule (10 mg total) by mouth 2 (two) times daily before a meal. 10/19/20   Lemmon, Lavone Nian, PA  glucose blood (ACCU-CHEK GUIDE) test strip TEST FOUR TIMES DAILY AS DIRECTED 09/23/18   Hoyt Koch, MD  Lancets (ONETOUCH DELICA PLUS [id number]) [place] USE TO CHECK SUGARS 4 TIMES DAILY 12/01/19   Hoyt Koch, MD  methylcellulose (CITRUCEL) oral powder Citrucel: use as directed once daily 12/01/17   Genelda Roark, Carlota Raspberry, MD     Current Outpatient Medications  Medication Sig Dispense Refill   allopurinol (ZYLOPRIM) 100 MG tablet TAKE 1 TABLET(100 MG) BY MOUTH DAILY 90 tablet 3   azelastine (ASTELIN) 0.1 % nasal spray Place 2 sprays into both nostrils 2 (two) times daily as needed. 30 mL 6   Cyanocobalamin (VITAMIN B 12 PO) Take 1 tablet by mouth daily.     fexofenadine (ALLEGRA) 180 MG tablet Take 180 mg by mouth daily.     gabapentin (NEURONTIN) 300 MG capsule Take 1 capsule (300 mg total) by mouth at bedtime. 90 capsule 3   Garcinia Cambogia-Chromium 500-200 MG-MCG TABS Take 1 capsule by mouth daily.     Green Tea, Camillia sinensis, (GREEN TEA PO) Take 1 capsule by mouth daily.     JARDIANCE 25 MG TABS tablet TAKE 1 TABLET(25 MG) BY MOUTH DAILY 90 tablet 3   losartan (COZAAR) 50 MG tablet TAKE 1 TABLET(50 MG) BY MOUTH DAILY 90 tablet 3   meloxicam (MOBIC) 15 MG tablet TAKE 1 TABLET(15 MG) BY MOUTH DAILY WITH FOOD 30 tablet 2   metFORMIN (GLUCOPHAGE) 1000 MG tablet TAKE 1 TABLET(1000 MG) BY MOUTH TWICE DAILY 180 tablet 3   mometasone (NASONEX) 50 MCG/ACT nasal spray Place 2 sprays into the nose daily.  1   Multiple Vitamin (MULTIVITAMIN) capsule Take 1 capsule by mouth daily.     omeprazole (PRILOSEC) 20 MG capsule Take 1 capsule (20 mg total) by mouth daily. 30 capsule 3   SYSTANE ULTRA 0.4-0.3 % SOLN SMARTSIG:1 Drop(s) In Eye(s) PRN     VITAMIN D PO Take by mouth.     dicyclomine (BENTYL) 10 MG capsule Take 1 capsule (10 mg total) by mouth 2 (two) times daily before a meal. 60 capsule 5   glucose blood (ACCU-CHEK GUIDE) test strip TEST FOUR TIMES DAILY AS DIRECTED 100 strip 2   Lancets (ONETOUCH DELICA PLUS [id number]) MISC USE TO CHECK SUGARS 4 TIMES DAILY 400 each 3   methylcellulose (CITRUCEL) oral powder Citrucel: use as directed once daily     Current Facility-Administered Medications  Medication Dose Route Frequency Provider Last Rate Last Admin   0.9 %  sodium chloride infusion  500 mL Intravenous  Once Alice Burnside, Carlota Raspberry, MD        Allergies as of 12/14/2020   (No Known Allergies)    Family History  Problem Relation Age of Onset   Diabetes Mother    Anemia Mother    Immunodeficiency Mother    Stroke Father    Dementia Father    Hypertension Father    Stomach cancer Maternal Grandmother    Colon cancer Neg Hx    Prostate cancer Neg Hx    Cancer Neg Hx    Heart disease Neg Hx    COPD Neg Hx    Colon polyps Neg Hx    Esophageal cancer Neg Hx  Rectal cancer Neg Hx     Social History   Socioeconomic History   Marital status: Married    Spouse name: Annetta   Number of children: 2   Years of education: 12   Highest education level: Not on file  Occupational History   Occupation: k Chiropodist in maintenance    Employer: [employer]  Tobacco Use   Smoking status: Never   Smokeless tobacco: Never  Vaping Use   Vaping Use: Never used  Substance and Sexual Activity   Alcohol use: No    Alcohol/week: 0.0 standard drinks   Drug use: No   Sexual activity: Yes    Partners: Female  Other Topics Concern   Not on file  Social History Narrative   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 Married 1990. 1 daughter 2002 and 1 son 67. SO-multiple medical problems- but currently stable. Daughter w/ petit mal seizures. Marriage is in good health.     Social Determinants of Health   Financial Resource Strain: Not on file  Food Insecurity: Not on file  Transportation Needs: Not on file  Physical Activity: Not on file  Stress: Not on file  Social Connections: Not on file  Intimate Partner Violence: Not on file    Review of Systems: All other review of systems negative except as mentioned in the HPI.  Physical Exam: Vital signs BP 126/66 (Patient Position: Sitting)   Pulse 83   Temp 98.4 F (36.9 C)   Ht 5\' 8"  (1.727 m)   Wt (!) 316 lb (143.3 kg)   SpO2 95%   BMI 48.05 kg/m   General:   Alert,  Well-developed, pleasant and  cooperative in NAD Lungs:  Clear throughout to auscultation.   Heart:  Regular rate and rhythm Abdomen:  Soft, nontender and nondistended.  Protuberant. Neuro/Psych:  Alert and cooperative. Normal mood and affect. A and O x 3  Jolly Mango, MD [facility]

## 2020-12-19 ENCOUNTER — Telehealth: Payer: Self-pay

## 2020-12-19 NOTE — Telephone Encounter (Signed)
  Follow up Call-  Call back number 12/14/2020  Post procedure Call Back phone  # [phone]  Permission to leave phone message Yes  Some recent data might be hidden     Patient questions:  Do you have a fever, pain , or abdominal swelling? No. Pain Score  0 *  Have you tolerated food without any problems? Yes.    Have you been able to return to your normal activities? Yes.    Do you have any questions about your discharge instructions: Diet   No. Medications  No. Follow up visit  No.  Do you have questions or concerns about your Care? No.  Actions: * If pain score is 4 or above: No action needed, pain <4. Have you developed a fever since your procedure? no  2.   Have you had an respiratory symptoms (SOB or cough) since your procedure? no  3.   Have you tested positive for COVID 19 since your procedure no  4.   Have you had any family members/close contacts diagnosed with the COVID 19 since your procedure?  no   If yes to any of these questions please route to Joylene John, RN and Joella Prince, RN

## 2021-02-02 ENCOUNTER — Other Ambulatory Visit: Payer: Self-pay | Admitting: Internal Medicine

## 2021-02-06 ENCOUNTER — Other Ambulatory Visit: Payer: Self-pay

## 2021-02-06 ENCOUNTER — Ambulatory Visit (INDEPENDENT_AMBULATORY_CARE_PROVIDER_SITE_OTHER): Payer: 59 | Admitting: Podiatry

## 2021-02-06 DIAGNOSIS — M79675 Pain in left toe(s): Secondary | ICD-10-CM

## 2021-02-06 DIAGNOSIS — M79674 Pain in right toe(s): Secondary | ICD-10-CM

## 2021-02-06 DIAGNOSIS — B351 Tinea unguium: Secondary | ICD-10-CM

## 2021-02-06 DIAGNOSIS — E119 Type 2 diabetes mellitus without complications: Secondary | ICD-10-CM

## 2021-02-07 ENCOUNTER — Encounter: Payer: Self-pay | Admitting: Internal Medicine

## 2021-02-07 ENCOUNTER — Ambulatory Visit (INDEPENDENT_AMBULATORY_CARE_PROVIDER_SITE_OTHER): Payer: 59 | Admitting: Internal Medicine

## 2021-02-07 VITALS — BP 130/70 | HR 93 | Resp 18 | Ht 68.0 in | Wt 321.2 lb

## 2021-02-07 DIAGNOSIS — L731 Pseudofolliculitis barbae: Secondary | ICD-10-CM | POA: Insufficient documentation

## 2021-02-07 DIAGNOSIS — E1169 Type 2 diabetes mellitus with other specified complication: Secondary | ICD-10-CM | POA: Diagnosis not present

## 2021-02-07 LAB — POCT GLYCOSYLATED HEMOGLOBIN (HGB A1C): Hemoglobin A1C: 5.7 % — AB (ref 4.0–5.6)

## 2021-02-07 MED ORDER — SULFAMETHOXAZOLE-TRIMETHOPRIM 800-160 MG PO TABS: 1.0000 | ORAL_TABLET | Freq: Two times a day (BID) | ORAL | 0 refills | Status: DC

## 2021-02-07 MED ORDER — METFORMIN HCL 1000 MG PO TABS: 1000.0000 mg | ORAL_TABLET | Freq: Every day | ORAL | 3 refills | Status: DC

## 2021-02-07 NOTE — Patient Instructions (Addendum)
We will have you cut back to 1 pill metformin daily.   We have sent in bactrim to take 1 pill twice a day for 5 days for the hair.

## 2021-02-07 NOTE — Assessment & Plan Note (Signed)
POC HgA1c done 5.7. Will reduce metformin to 1000 mg daily and keep jardiance 25 mg daily.

## 2021-02-07 NOTE — Assessment & Plan Note (Signed)
Rx bactrim 5 day course. Not amenable to I and D today.

## 2021-02-07 NOTE — Progress Notes (Signed)
° °  Subjective:   Patient ID: Randy Hendrix, male    DOB: [birth year], 55 y.o.   MRN: [id number]  HPI The patient is a 55 YO man coming in for follow up medical conditions.   Review of Systems  Constitutional: Negative.   HENT: Negative.    Eyes: Negative.   Respiratory:  Negative for cough, chest tightness and shortness of breath.   Cardiovascular:  Negative for chest pain, palpitations and leg swelling.  Gastrointestinal:  Negative for abdominal distention, abdominal pain, constipation, diarrhea, nausea and vomiting.  Musculoskeletal: Negative.   Skin:  Positive for wound.  Neurological: Negative.   Psychiatric/Behavioral: Negative.     Objective:  Physical Exam Constitutional:      Appearance: He is well-developed. He is obese.  HENT:     Head: Normocephalic and atraumatic.  Cardiovascular:     Rate and Rhythm: Normal rate and regular rhythm.  Pulmonary:     Effort: Pulmonary effort is normal. No respiratory distress.     Breath sounds: Normal breath sounds. No wheezing or rales.  Abdominal:     General: Bowel sounds are normal. There is no distension.     Palpations: Abdomen is soft.     Tenderness: There is no abdominal tenderness. There is no rebound.  Genitourinary:    Comments: Ingrown hair 2 mm circular no fluctuance midline inguinal region no redness surrounding Musculoskeletal:     Cervical back: Normal range of motion.  Skin:    General: Skin is warm and dry.  Neurological:     Mental Status: He is alert and oriented to person, place, and time.     Coordination: Coordination normal.    Vitals:   02/07/21 0910  BP: 130/70  Pulse: 93  Resp: 18  SpO2: 96%  Weight: (!) 321 lb 3.2 oz (145.7 kg)  Height: 5\' 8"  (1.727 m)    This visit occurred during the SARS-CoV-2 public health emergency.  Safety protocols were in place, including screening questions prior to the visit, additional usage of staff PPE, and extensive cleaning of exam room while observing  appropriate contact time as indicated for disinfecting solutions.   Assessment & Plan:  Flu shot given at visit

## 2021-02-08 ENCOUNTER — Ambulatory Visit: Payer: 59 | Admitting: Internal Medicine

## 2021-02-09 ENCOUNTER — Other Ambulatory Visit: Payer: Self-pay | Admitting: Internal Medicine

## 2021-02-13 ENCOUNTER — Other Ambulatory Visit: Payer: Self-pay | Admitting: Physician Assistant

## 2021-02-13 ENCOUNTER — Encounter: Payer: Self-pay | Admitting: Podiatry

## 2021-02-13 NOTE — Progress Notes (Signed)
Subjective: Randy Hendrix is a 55 y.o. male patient seen today for preventative diabetic foot care follow up. He is seen for  painful thick toenails that are difficult to trim. Pain interferes with ambulation. Aggravating factors include wearing enclosed shoe gear. Pain is relieved with periodic professional debridement.  New problems reported today: None.  Patient did not check blood glucose on today. Daily readings range between 100-110 mg/dl.  PCP is Hoyt Koch, MD. Last visit was: 08/08/2020.  No Known Allergies  Objective: Physical Exam  General: Patient is a pleasant 55 y.o. African American male morbidly obese in NAD. AAO x 3.   Neurovascular Examination: CFT immediate b/l LE. Palpable DP/PT pulses b/l LE. Digital hair sparse b/l. Skin temperature gradient WNL b/l. No pain with calf compression b/l. No edema noted b/l. No cyanosis or clubbing noted b/l LE.  Protective sensation intact 5/5 intact bilaterally with 10g monofilament b/l. Vibratory sensation intact b/l.  Dermatological:  Pedal integument with normal turgor, texture and tone b/l LE. No open wounds b/l. No interdigital macerations b/l. Toenails 1-5 b/l elongated, thickened, discolored with subungual debris. +Tenderness with dorsal palpation of nailplates. No hyperkeratotic or porokeratotic lesions present.  Musculoskeletal:  Normal muscle strength 5/5 to all lower extremity muscle groups bilaterally. Pes planus deformity noted bilateral LE.Marland Kitchen No pain, crepitus or joint limitation noted with ROM b/l LE.  Patient ambulates independently without assistive aids.  Assessment: 1. Pain due to onychomycosis of toenails of both feet   2. Type 2 diabetes mellitus without complication, without long-term current use of insulin ([place])    Plan: Patient was evaluated and treated and all questions answered. Consent given for treatment as described below: -Continue foot and shoe inspections daily. Monitor blood  glucose per PCP/Endocrinologist's recommendations. -Mycotic toenails 1-5 bilaterally were debrided in length and girth with sterile nail nippers and dremel without incident. -Patient/POA to call should there be question/concern in the interim.  Return in about 3 months (around 05/07/2021).  Marzetta Board, DPM

## 2021-05-15 ENCOUNTER — Encounter: Payer: Self-pay | Admitting: Podiatry

## 2021-05-15 ENCOUNTER — Ambulatory Visit (INDEPENDENT_AMBULATORY_CARE_PROVIDER_SITE_OTHER): Payer: 59 | Admitting: Podiatry

## 2021-05-15 ENCOUNTER — Other Ambulatory Visit: Payer: Self-pay

## 2021-05-15 DIAGNOSIS — M79675 Pain in left toe(s): Secondary | ICD-10-CM | POA: Diagnosis not present

## 2021-05-15 DIAGNOSIS — B353 Tinea pedis: Secondary | ICD-10-CM | POA: Diagnosis not present

## 2021-05-15 DIAGNOSIS — E119 Type 2 diabetes mellitus without complications: Secondary | ICD-10-CM

## 2021-05-15 DIAGNOSIS — M79674 Pain in right toe(s): Secondary | ICD-10-CM | POA: Diagnosis not present

## 2021-05-15 DIAGNOSIS — B351 Tinea unguium: Secondary | ICD-10-CM

## 2021-05-15 NOTE — Patient Instructions (Signed)
Spray Lotrimin Spray powder between toes once daily. ? ?Athlete's Foot ?Athlete's foot (tinea pedis) is a fungal infection of the skin on your feet. It often occurs on the skin that is between or underneath the toes. It can also occur on the soles of your feet. The infection can spread from person to person (is contagious). It can also spread when a person's bare feet come in contact with the fungus on shower floors or on items such as shoes. ?What are the causes? ?This condition is caused by a fungus that grows in warm, moist places. You can get athlete's foot by sharing shoes, shower stalls, towels, and wet floors with someone who is infected. Not washing your feet or changing your socks often enough can also lead to athlete's foot. ?What increases the risk? ?This condition is more likely to develop in: ?Men. ?People who have a weak body defense system (immune system). ?People who have diabetes. ?People who use public showers, such as at a gym. ?People who wear heavy-duty shoes, such as Environmental manager. ?Seasons with warm, humid weather. ?What are the signs or symptoms? ?Symptoms of this condition include: ?Itchy areas between your toes or on the soles of your feet. ?White, flaky, or scaly areas between your toes or on the soles of your feet. ?Very itchy small blisters between your toes or on the soles of your feet. ?Small cuts in your skin. These cuts can become infected. ?Thick or discolored toenails. ?How is this diagnosed? ?This condition may be diagnosed with a physical exam and a review of your medical history. Your health care provider may also take a skin or toenail sample to examine under a microscope. ?How is this treated? ?This condition is treated with antifungal medicines. These may be applied as powders, ointments, or creams. In severe cases, an oral antifungal medicine may be given. ?Follow these instructions at home: ?Medicines ?Apply or take over-the-counter and prescription medicines  only as told by your health care provider. ?Apply your antifungal medicine as told by your health care provider. Do not stop using the antifungal even if your condition improves. ?Foot care ?Do not scratch your feet. ?Keep your feet dry: ?Wear cotton or wool socks. Change your socks every day or if they become wet. ?Wear shoes that allow air to flow, such as sandals or canvas tennis shoes. ?Wash and dry your feet, including the area between your toes. Also, wash and dry your feet: ?Every day or as told by your health care provider. ?After exercising. ?General instructions ?Do not let others use towels, shoes, nail clippers, or other personal items that touch your feet. ?Protect your feet by wearing sandals in wet areas, such as locker rooms and shared showers. ?Keep all follow-up visits. This is important. ?If you have diabetes, keep your blood sugar under control. ?Contact a health care provider if: ?You have a fever. ?You have swelling, soreness, warmth, or redness in your foot. ?Your feet are not getting better with treatment. ?Your symptoms get worse. ?You have new symptoms. ?You have severe pain. ?Summary ?Athlete's foot (tinea pedis) is a fungal infection of the skin on your feet. It often occurs on skin that is between or underneath the toes. ?This condition is caused by a fungus that grows in warm, moist places. ?Symptoms include white, flaky, or scaly areas between your toes or on the soles of your feet. ?This condition is treated with antifungal medicines. ?Keep your feet clean. Always dry them thoroughly. ?This information is  not intended to replace advice given to you by your health care provider. Make sure you discuss any questions you have with your health care provider. ?Document Revised: 05/28/2020 Document Reviewed: 05/28/2020 ?Elsevier Patient Education ? [place]. ? ?

## 2021-05-15 NOTE — Progress Notes (Signed)
?  Subjective:  ?Patient ID: Randy Hendrix, male    DOB: 1965/10/26,  MRN: [id number] ? ?Randy Hendrix presents to clinic today for preventative diabetic foot care and painful elongated mycotic toenails 1-5 bilaterally which are tender when wearing enclosed shoe gear. Pain is relieved with periodic professional debridement. ? ?Patient did not check blood glucose today. ? ?New problem(s): None.  ? ?PCP is Randy Koch, MD , and last visit was February 07, 2021. ? ?No Known Allergies ? ?Review of Systems: Negative except as noted in the HPI. ? ?Objective: No changes noted in today's physical examination. ?Physical Exam ? ?General: Patient is a pleasant 56 y.o. African American male morbidly obese in NAD. AAO x 3.  ? ?Neurovascular Examination: ?CFT immediate b/l LE. Palpable DP/PT pulses b/l LE. Digital hair sparse b/l. Skin temperature gradient WNL b/l. No pain with calf compression b/l. No edema noted b/l. No cyanosis or clubbing noted b/l LE. ? ?Protective sensation intact 5/5 intact bilaterally with 10g monofilament b/l. Vibratory sensation intact b/l. ? ?Dermatological:  ?Pedal integument with normal turgor, texture and tone BLE. Interdigital maceration noted 1st-4th bilateral webspace(s). No blistering, no weeping, no open wounds. ? ?Musculoskeletal:  ?Normal muscle strength 5/5 to all lower extremity muscle groups bilaterally. Pes planus deformity noted bilateral LE.Marland Kitchen No pain, crepitus or joint limitation noted with ROM b/l LE.  Patient ambulates independently without assistive aids. ? ?Assessment: ?1. Pain due to onychomycosis of toenails of both feet   ?2. Tinea pedis of both feet   ?3. Type 2 diabetes mellitus without complication, without long-term current use of insulin ([place])   ? ?Plan: ?-Consent given for treatment as described below: ?-Continue foot and shoe inspections daily. Monitor blood glucose per PCP/Endocrinologist's recommendations. ?-Patient to continue soft, supportive  shoe gear daily. ?-Toenails 1-5 b/l were debrided in length and girth with sterile nail nippers and dremel without iatrogenic bleeding.  ?-Patient instructed to spray Lotrimin Spray Powder between toes once daily. Information dispensed on athlete's feet. ?-Patient/POA to call should there be question/concern in the interim. ? ?Return in about 3 months (around 08/15/2021). ? ?Randy Hendrix, DPM ?

## 2021-05-16 ENCOUNTER — Telehealth: Payer: Self-pay | Admitting: Gastroenterology

## 2021-05-16 DIAGNOSIS — R194 Change in bowel habit: Secondary | ICD-10-CM

## 2021-05-16 DIAGNOSIS — R197 Diarrhea, unspecified: Secondary | ICD-10-CM

## 2021-05-16 NOTE — Telephone Encounter (Signed)
Inbound call from patients wife wanting to schedule appointment for him for diarrhea. Patient was scheduled for 4/12 at 11. Patients wife is wanting to discuss some information with the nurse. Requesting a call back. Please advise.  ?

## 2021-05-17 NOTE — Telephone Encounter (Signed)
Called and spoke with patient's wife regarding Dr. Doyne Keel recommendations. Wife is aware that they can stop by the lab at their convenience to pick up the stool kits. Pt's wife has been advised that we do have to send the stool tests out and it can take several days to result. Pt's wife states that they will pick up the kit on Monday and return it on Tuesday. I told her that we will be in touch regarding the results. Pt's wife verbalized understanding and had no concerns at the end of the call. ? ?GI profile order in epic. ?

## 2021-05-17 NOTE — Telephone Encounter (Signed)
Returned call to patient's wife. She reports that pt is having ongoing diarrhea. Wife reports that symptoms are similar to what he was experiencing the last time he was here. Wife reports that their daughter was recently having GI symptoms also and tested positive for Vibrio. Wife thinks that the patient may have the same thing, they ate a restaurant about 3 weeks ago and wife reports that the pt's diarrhea has increased since then. Wife reports that pt was having about 3-4 stools a day and now he is having about 5-6 episodes. Wife reports that pt is having a lot of gas and some abdominal discomfort. Denies any fever. Pt has not taken any Imodium or used Bentyl. I told pt's wife that he has several refills of Bentyl at his pharmacy and could try that for the abdominal cramping. I told her that I would hold off on Imodium until we find out if you wanted to order stool studies for the pt. He has an appt with Alonza Bogus, PA-C on Wednesday, 05/30/21 at 11 am. Please advise, thanks.  ?

## 2021-05-17 NOTE — Telephone Encounter (Signed)
Thanks [employer]. Agree. Let's send off a GI pathogen panel if he can submit that at his earliest convenience. Thanks ?

## 2021-05-21 ENCOUNTER — Other Ambulatory Visit: Payer: 59

## 2021-05-23 ENCOUNTER — Other Ambulatory Visit: Payer: 59

## 2021-05-23 DIAGNOSIS — R194 Change in bowel habit: Secondary | ICD-10-CM

## 2021-05-23 DIAGNOSIS — R197 Diarrhea, unspecified: Secondary | ICD-10-CM

## 2021-05-25 LAB — GI PROFILE, STOOL, PCR

## 2021-05-30 ENCOUNTER — Ambulatory Visit (INDEPENDENT_AMBULATORY_CARE_PROVIDER_SITE_OTHER): Payer: 59 | Admitting: Gastroenterology

## 2021-05-30 ENCOUNTER — Encounter: Payer: Self-pay | Admitting: Gastroenterology

## 2021-05-30 VITALS — BP 100/66 | HR 72 | Ht 68.0 in | Wt 325.5 lb

## 2021-05-30 DIAGNOSIS — R197 Diarrhea, unspecified: Secondary | ICD-10-CM | POA: Insufficient documentation

## 2021-05-30 DIAGNOSIS — R143 Flatulence: Secondary | ICD-10-CM | POA: Diagnosis not present

## 2021-05-30 DIAGNOSIS — R140 Abdominal distension (gaseous): Secondary | ICD-10-CM | POA: Diagnosis not present

## 2021-05-30 NOTE — Patient Instructions (Signed)
You have been given a testing kit to check for small intestine bacterial overgrowth (SIBO) which is completed by a company named [facility]. Make sure to return your test in the mail using the return mailing label given to you along with the kit. Your demographic and insurance information have already been sent to the company and they should be in contact with you over the next 1-2 weeks regarding this test. [facility] will collect an upfront charge of $99.74 for commercial insurance plans and $209.74 is you are paying cash. Make sure to discuss with [facility] PRIOR to having the test to see if they have gotten information from your insurance company as to how much your testing will cost out of pocket, if any. Please keep in mind that you will be getting a call from phone number [phone] or a similar number. If you do not hear from them within this time frame, please call our office at [phone] or call [facility] directly at [phone].  ? ?If you are age 21 or younger, your body mass index should be between 19-25. Your Body mass index is 49.49 kg/m?Marland Kitchen If this is out of the aformentioned range listed, please consider follow up with your Primary Care Provider.  ? ?________________________________________________________ ? ?[facility] providers would like to encourage you to use [facility] to communicate with providers for non-urgent requests or questions.  Due to long hold times on the telephone, sending your provider a message by [facility] may be a faster and more efficient way to get a response.  Please allow 48 business hours for a response.  Please remember that this is for non-urgent requests.  ?_______________________________________________________ ? ?

## 2021-05-30 NOTE — Progress Notes (Signed)
? ? ? ?05/30/2021 ?Randy Hendrix ?[id number] ?Mar 09, 1965 ? ? ?HISTORY OF PRESENT ILLNESS:  This is a 56 year old African-American male with a past medical history of reflux, obesity and others listed below, known to Dr. Havery Moros. ?   06/26/2015 colonoscopy with 1 small tubular adenoma and diverticulosis as well as internal hemorrhoids.  Repeat recommended in 5 years. ?   12/01/2017 patient met with Dr. Havery Moros for IBS symptoms.  Had a CT the abdomen pelvis 09/24/2017 with mild wall thickening of the bladder, borderline right inguinal lymphadenopathy, mildly prominent peripancreatic lymph nodes, suspected renal cysts, sigmoid diverticulosis and lumbar impingement.  Also noted chronic microcytic anemia.  That time discussed postinfectious IBS.  Continued IBgard as it was helping.  Also recommended the low FODMAP diet.  Also started on Citrucel.  Recommended repeat CT scan in 1 to 2 months to check on lymphadenopathy. ?   03/10/2018 patient seen for 3 days of painless hematochezia.  Only minimally inflamed internal hemorrhoids on anoscopy.  It was thought patient had a diverticular hemorrhage.  Recheck CBC. ?   10/19/2020, the patient presents to clinic accompanied by his wife who does assist with history.  He explains that ever since 2017 or 2018 and having the "gastrointestinal flu", he has had symptoms off and on of some periumbilical/epigastric pain and cramping throughout his abdomen with radiation between diarrhea and constipation with a lot of excessive gas.  Was scheduled for EGD and colonoscopy with Dr. Havery Moros, which she had performed 12/14/2020.  EGD revealed 2 cm hiatal hernia and some gastritis, biopsies negative for H. pylori.  Colonoscopy showed some polyps, both tubular adenomas and hyperplastic polyps, diverticulosis, and hemorrhoids. ?  He is here today with his wife.  They tell me that when the appointment was made he was having diarrhea, but now he has not been having diarrhea for about the  past week or so and actually has not had a bowel movement for about 2 days.  He does report a lot of gas with flatulence and belching.  Has intermittent left lower quadrant abdominal pains.  No pain currently.  He had some worsening of his diarrhea recently and his daughter was diagnosed with Vibrio so GI pathogen panel was ordered and was negative. ? ? ?Past Medical History:  ?Diagnosis Date  ? Allergic rhinitis   ? ALLERGIC RHINITIS   ? Allergy   ? Gastroenteritis   ? GERD (gastroesophageal reflux disease)   ? occasionally  ? Gout   ? Hemorrhoid   ? Kidney stone on right side 05/2004  ? OBESITY, CLASS II   ? OSA (obstructive sleep apnea)   ? CPAP qhs  ? Oxygen deficiency   ? PATELLAR DISLOCATION, RIGHT   ? Sleep apnea   ? wears cpap  ? ?Past Surgical History:  ?Procedure Laterality Date  ? HEMORRHOID SURGERY    ? WISDOM TOOTH EXTRACTION    ? ? reports that he has never smoked. He has never used smokeless tobacco. He reports that he does not drink alcohol and does not use drugs. ?family history includes Anemia in his mother; Dementia in his father; Diabetes in his mother; Hypertension in his father; Immunodeficiency in his mother; Stomach cancer in his maternal grandmother; Stroke in his father. ?No Known Allergies ? ?  ?Outpatient Encounter Medications as of 05/30/2021  ?Medication Sig  ? allopurinol (ZYLOPRIM) 100 MG tablet TAKE 1 TABLET(100 MG) BY MOUTH DAILY  ? azelastine (ASTELIN) 0.1 % nasal spray USE 2 SPRAYS IN [facility]  NOSTRIL TWICE DAILY AS NEEDED  ? Cyanocobalamin (VITAMIN B 12 PO) Take 1 tablet by mouth daily.  ? fexofenadine (ALLEGRA) 180 MG tablet Take 180 mg by mouth daily.  ? gabapentin (NEURONTIN) 300 MG capsule TAKE 1 CAPSULE(300 MG) BY MOUTH AT BEDTIME  ? Garcinia Cambogia-Chromium 500-200 MG-MCG TABS Take 1 capsule by mouth daily.  ? glucose blood (ACCU-CHEK GUIDE) test strip TEST FOUR TIMES DAILY AS DIRECTED  ? Green Tea, Camillia sinensis, (GREEN TEA PO) Take 1 capsule by mouth daily.  ? JARDIANCE  25 MG TABS tablet TAKE 1 TABLET(25 MG) BY MOUTH DAILY  ? Lancets (ONETOUCH DELICA PLUS CYELYH90B) MISC USE TO CHECK SUGARS 4 TIMES DAILY  ? losartan (COZAAR) 50 MG tablet TAKE 1 TABLET(50 MG) BY MOUTH DAILY  ? meloxicam (MOBIC) 15 MG tablet TAKE 1 TABLET(15 MG) BY MOUTH DAILY WITH FOOD  ? metFORMIN (GLUCOPHAGE) 1000 MG tablet Take 1 tablet (1,000 mg total) by mouth daily with breakfast.  ? mometasone (NASONEX) 50 MCG/ACT nasal spray Place 2 sprays into the nose daily.  ? Multiple Vitamin (MULTIVITAMIN) capsule Take 1 capsule by mouth daily.  ? SYSTANE ULTRA 0.4-0.3 % SOLN SMARTSIG:1 Drop(s) In Eye(s) PRN  ? VITAMIN D PO Take by mouth.  ? dicyclomine (BENTYL) 10 MG capsule Take 1 capsule (10 mg total) by mouth 2 (two) times daily before a meal. (Patient not taking: Reported on 05/30/2021)  ? methylcellulose (CITRUCEL) oral powder Citrucel: use as directed once daily (Patient taking differently: Taking 1 dose as needed)  ? omeprazole (PRILOSEC) 20 MG capsule TAKE 1 CAPSULE(20 MG) BY MOUTH DAILY (Patient not taking: Reported on 05/30/2021)  ? [DISCONTINUED] sulfamethoxazole-trimethoprim (BACTRIM DS) 800-160 MG tablet Take 1 tablet by mouth 2 (two) times daily.  ? ?No facility-administered encounter medications on file as of 05/30/2021.  ? ? ? ?REVIEW OF SYSTEMS  : All other systems reviewed and negative except where noted in the History of Present Illness. ? ? ?PHYSICAL EXAM: ?BP 100/66 (BP Location: Left Arm, Patient Position: Sitting, Cuff Size: Large)   Pulse 72   Ht '5\' 8"'  (1.727 m) Comment: height measured without shoes  Wt (!) 325 lb 8 oz (147.6 kg)   BMI 49.49 kg/m?  ?General: Well developed AA male in no acute distress ?Head: Normocephalic and atraumatic ?Eyes:  Sclerae anicteric, conjunctiva pink. ?Ears: Normal auditory acuity ?Lungs: Clear throughout to auscultation; no W/R/R. ?Heart: Regular rate and rhythm; no M/R/G. ?Abdomen: Soft, non-distended.  BS present.  Non-tender. ?Musculoskeletal: Symmetrical with  no gross deformities  ?Skin: No lesions on visible extremities ?Extremities: No edema  ?Neurological: Alert oriented x 4, grossly non-focal ?Psychological:  Alert and cooperative. Normal mood and affect ? ?ASSESSMENT AND PLAN: ?*56 year old male with ongoing complaints of altered bowel habits, mostly issues with diarrhea and gas/bloating.  Thinks that all of this started after severe gastroenteritis in 2017 or 2018.  Other work-up has been ok.  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 SIBO.  Currently feeling better for about the past week but issues have continued on and off for years.  Will plan for SIBO breath test. ? ? ?CC:  Hoyt Koch, * ? ?  ?

## 2021-05-31 ENCOUNTER — Ambulatory Visit (INDEPENDENT_AMBULATORY_CARE_PROVIDER_SITE_OTHER): Payer: 59 | Admitting: Podiatry

## 2021-05-31 DIAGNOSIS — B353 Tinea pedis: Secondary | ICD-10-CM

## 2021-05-31 MED ORDER — TERBINAFINE HCL 250 MG PO TABS: 250.0000 mg | ORAL_TABLET | Freq: Every day | ORAL | 0 refills | Status: AC

## 2021-05-31 NOTE — Progress Notes (Signed)
Agree with assessment and plan as outlined. Can check for SIBO. If bowels altered with loose stools and then alternating with constipation, would otherwise try a fiber supplement daily such as Citrucel to keep stools regular

## 2021-05-31 NOTE — Progress Notes (Signed)
?  Subjective:  ?Patient ID: Randy Hendrix, male    DOB: 1965-03-29,  MRN: [id number] ? ?Chief Complaint  ?Patient presents with  ? Tinea Pedis  ?    3 weeks with Dr. Sherryle Lis for moderate interdigital tinea pedis; using Lotrimin Spray Powder daily.  ? ? ?56 y.o. male presents with the above complaint. History confirmed with patient.  He is referred by Dr. Elisha Ponder for evaluation of interdigital tinea pedis.  Says he does have some itching.  He has been using the Lotrimin spray powder that she asked him to use. ? ?Objective:  ?Physical Exam: ?warm, good capillary refill, no trophic changes or ulcerative lesions, normal DP and PT pulses, normal sensory exam, and tinea pedis. ? ?Assessment:  ? ?1. Tinea pedis of both feet   ? ? ? ?Plan:  ?Patient was evaluated and treated and all questions answered. ? ?Discussed etiology and treatment options of tinea pedis.  I did recommend he continue using the Lotrimin spray powder.  Also discussed foot hygiene, changing socks if feet are sweating, using an antifungal spray or Lysol and shoes and cleaning tile and bathroom surfaces with bleach based cleaner.  I recommended a 4-week course of Lamisil and sent this to his pharmacy.  I will see him back as needed if is not improved within 1 month. ? ?Return if symptoms worsen or fail to improve.  ? ?

## 2021-06-15 ENCOUNTER — Ambulatory Visit: Payer: 59 | Admitting: Internal Medicine

## 2021-06-23 NOTE — Progress Notes (Signed)
HPI ? ?M never smoker followed for OSA, former third shift worker, complicated by morbid obesity, allergic rhinitis, GERD, ?NPSG 2000:  AHI 124/hr ? ?----------------------------------------------------------------- ? ? ?06/15/20- 57 year old male never smoker followed for OSA, complicated by GERD, allergic rhinitis, obesity, DM2, Gout,  ?CPAP  Auto 10-20/[facility] ?Download-compliance 97%, AHI 0.6/ hr ?Body weight today-314 lbs ?Covid vax- ?-----Still using CPAP no other complaints ?Sleeps better with CPAP ?Wife comments on his heavy breathing, without cough or wheeze. ? ?06/26/21-  56 year old male never smoker followed for OSA, complicated by GERD, allergic rhinitis, obesity, DM2, Gout,  ?CPAP  [facility] ?Download-compliance 100%, AHI 0.6/ hr ?Body weight today-328 lbs ?Covid vax-4 [employer] ?Flu vax- had ?Works second shift. Gets to bed late. ?Wife says he tends to sit in his chair and fall asleep there instead of getting up and getting into bed.  Discussed.  Download reviewed.  He is very compliant with CPAP and they both say his life is better using it.  There was a question if his machine lost electrical contact for download 1 day.  I reminded him to contact [place] for service if needed. ?CXR 06/16/20-  ?IMPRESSION: ?No active cardiopulmonary disease. ? ? ?ROS-see HPI + = positive ?Constitutional:    weight loss, night sweats, fevers, chills, fatigue, lassitude. ?HEENT:    headaches, difficulty swallowing, tooth/dental problems, sore throat,  ?     sneezing, itching, ear ache, nasal congestion, post nasal drip, snoring ?CV:    chest pain, orthopnea, PND, swelling in lower extremities, anasarca,                                                    dizziness, palpitations ?Resp:   shortness of breath with exertion or at rest.   ?             productive cough,   non-productive cough, coughing up of blood.   ?           change in color of mucus.  wheezing.   ?Skin:    rash or  lesions. ?GI:  No-   heartburn, indigestion, abdominal pain, nausea, vomiting, diarrhea,  ?               change in bowel habits, loss of appetite ?GU: dysuria, change in color of urine, no urgency or frequency.   flank pain. ?MS:   joint pain, stiffness, decreased range of motion, back pain. ?Neuro-     nothing unusual ?Psych:  change in mood or affect.  depression or anxiety.   memory loss. ? ?OBJ- Physical Exam ?General- Alert, Oriented, Affect-appropriate, Distress- none acute + obese ?Skin- rash-none, lesions- none, excoriation- none ?Lymphadenopathy- none ?Head- atraumatic ?           Eyes- Gross vision intact, PERRLA, conjunctivae and secretions clear ?           Ears- Hearing, canals-normal ?           Nose- Clear, no-Septal dev, mucus, polyps, erosion, perforation  ?           Throat- Mallampati IV , mucosa clear , drainage- none, tonsils- atrophic ?Neck- flexible , trachea midline, no stridor , thyroid nl, carotid no bruit ?Chest - symmetrical excursion , unlabored ?          Heart/CV- RRR , no murmur ,  no gallop  , no rub, nl s1 s2 ?                          - JVD- none , edema+1-2+, stasis changes- none, varices- none ?          Lung- clear to P&A, wheeze- none, cough- none , dullness-none, rub- none ?          Chest wall-  ?Abd-  ?Br/ Gen/ Rectal- Not done, not indicated ?Extrem- cyanosis- none, clubbing, none, atrophy- none, strength- nl ?Neuro- grossly intact to observation ? ? ? ? ?

## 2021-06-24 ENCOUNTER — Encounter: Payer: Self-pay | Admitting: Internal Medicine

## 2021-06-26 ENCOUNTER — Encounter: Payer: Self-pay | Admitting: Internal Medicine

## 2021-06-26 ENCOUNTER — Ambulatory Visit (INDEPENDENT_AMBULATORY_CARE_PROVIDER_SITE_OTHER): Payer: 59 | Admitting: Internal Medicine

## 2021-06-26 DIAGNOSIS — G4733 Obstructive sleep apnea (adult) (pediatric): Secondary | ICD-10-CM | POA: Diagnosis not present

## 2021-06-26 NOTE — Assessment & Plan Note (Signed)
Benefits from CPAP with good compliance and control.  He is reminded to contact [employer] for service if needed. ?Plan-continue auto 10-20 ?

## 2021-06-26 NOTE — Assessment & Plan Note (Signed)
Encouraged more efforts at diet and exercise towards weight loss which would be beneficial for several of his problems. ?

## 2021-06-26 NOTE — Patient Instructions (Signed)
We can continue CPAP auto 10-20 ? ?Please contact [place] if your machine seems not to be working right. ? ?Feel free to call us if we can help ?

## 2021-07-13 ENCOUNTER — Other Ambulatory Visit: Payer: Self-pay | Admitting: Internal Medicine

## 2021-07-20 ENCOUNTER — Telehealth: Payer: Self-pay

## 2021-07-20 NOTE — Telephone Encounter (Signed)
PA started   Key: [facility]

## 2021-07-23 ENCOUNTER — Other Ambulatory Visit: Payer: Self-pay | Admitting: Internal Medicine

## 2021-07-31 ENCOUNTER — Other Ambulatory Visit: Payer: Self-pay | Admitting: Internal Medicine

## 2021-08-04 ENCOUNTER — Other Ambulatory Visit: Payer: Self-pay | Admitting: Internal Medicine

## 2021-08-17 ENCOUNTER — Ambulatory Visit (INDEPENDENT_AMBULATORY_CARE_PROVIDER_SITE_OTHER): Payer: 59 | Admitting: Podiatry

## 2021-08-17 ENCOUNTER — Ambulatory Visit
Admission: EM | Admit: 2021-08-17 | Discharge: 2021-08-17 | Disposition: A | Discharge status: Home / Self Care | Payer: 59 | Attending: Internal Medicine | Admitting: Internal Medicine

## 2021-08-17 ENCOUNTER — Encounter: Payer: Self-pay | Admitting: Podiatry

## 2021-08-17 DIAGNOSIS — M79674 Pain in right toe(s): Secondary | ICD-10-CM

## 2021-08-17 DIAGNOSIS — J069 Acute upper respiratory infection, unspecified: Secondary | ICD-10-CM

## 2021-08-17 DIAGNOSIS — M79675 Pain in left toe(s): Secondary | ICD-10-CM | POA: Diagnosis not present

## 2021-08-17 DIAGNOSIS — B351 Tinea unguium: Secondary | ICD-10-CM

## 2021-08-17 DIAGNOSIS — E119 Type 2 diabetes mellitus without complications: Secondary | ICD-10-CM | POA: Diagnosis not present

## 2021-08-17 MED ORDER — BENZONATATE 100 MG PO CAPS: 100.0000 mg | ORAL_CAPSULE | Freq: Three times a day (TID) | ORAL | 0 refills | Status: DC | PRN

## 2021-08-17 MED ORDER — FLUTICASONE PROPIONATE 50 MCG/ACT NA SUSP: 1.0000 | Freq: Every day | NASAL | 0 refills | Status: DC

## 2021-08-17 NOTE — ED Provider Notes (Signed)
[facility]    CSN: [id number] Arrival date & time: 08/17/21  1828      History   Chief Complaint Chief Complaint  Patient presents with   Cough    HPI Randy Hendrix is a 56 y.o. male.   Patient presents with nasal congestion and cough that has been present for approximately 5 days.  Denies any known sick contacts or fever.  Denies chest pain, shortness of breath, sore throat, ear pain, nausea, vomiting, diarrhea, abdominal pain.  Patient has taken Delsym with minimal improvement of symptoms.  Patient denies history of asthma or COPD and is not a smoker.    Cough   Past Medical History:  Diagnosis Date   Allergic rhinitis    ALLERGIC RHINITIS    Allergy    Gastroenteritis    GERD (gastroesophageal reflux disease)    occasionally   Gout    Hemorrhoid    Kidney stone on right side 05/2004   OBESITY, CLASS II    OSA (obstructive sleep apnea)    CPAP qhs   Oxygen deficiency    PATELLAR DISLOCATION, RIGHT    Sleep apnea    wears cpap    Patient Active Problem List   Diagnosis Date Noted   Diarrhea 05/30/2021   Flatulence 05/30/2021   Bloating 05/30/2021   Ingrown hair 02/07/2021   Dyspnea on exertion 09/26/2020   Lumbar radiculopathy 12/07/2019   Essential hypertension 08/09/2019   Diabetes mellitus ([place]) 07/07/2018   Trigger finger of left thumb 07/21/2017   Sleep disorder, 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   Gout 12/15/2014   Routine health maintenance 07/28/2011   Severe obesity (BMI >= 40) ([place]) 06/21/2008   OSA (obstructive sleep apnea) 04/28/2007    Past Surgical History:  Procedure Laterality Date   HEMORRHOID SURGERY     WISDOM TOOTH EXTRACTION         Home Medications    Prior to Admission medications   Medication Sig Start Date End Date Taking? Authorizing Provider  benzonatate (TESSALON) 100 MG capsule Take 1 capsule (100 mg total) by mouth every 8 (eight) hours as needed for cough. 08/17/21  Yes Jatoya Armbrister, Hildred Alamin E, FNP   fluticasone (FLONASE) 50 MCG/ACT nasal spray Place 1 spray into both nostrils daily. 08/17/21  Yes Jodi Kappes, Hildred Alamin E, FNP  allopurinol (ZYLOPRIM) 100 MG tablet TAKE 1 TABLET(100 MG) BY MOUTH DAILY 08/06/21   Hoyt Koch, MD  azelastine (ASTELIN) 0.1 % nasal spray USE 2 SPRAYS IN EACH NOSTRIL TWICE DAILY AS NEEDED 02/13/21   Hoyt Koch, MD  Cyanocobalamin (VITAMIN B 12 PO) Take 1 tablet by mouth daily.    [provider]  dicyclomine (BENTYL) 10 MG capsule Take 1 capsule (10 mg total) by mouth 2 (two) times daily before a meal. 10/19/20   Lemmon, Lavone Nian, PA  fexofenadine (ALLEGRA) 180 MG tablet Take 180 mg by mouth daily.    [provider]  gabapentin (NEURONTIN) 300 MG capsule TAKE 1 CAPSULE(300 MG) BY MOUTH AT BEDTIME 02/02/21   Hoyt Koch, MD  Garcinia Cambogia-Chromium 500-200 MG-MCG TABS Take 1 capsule by mouth daily.    [provider]  glucose blood (ACCU-CHEK GUIDE) test strip TEST FOUR TIMES DAILY AS DIRECTED 09/23/18   Hoyt Koch, MD  Eloise Levels, Camillia sinensis, (GREEN TEA PO) Take 1 capsule by mouth daily.    [provider]  JARDIANCE 25 MG TABS tablet TAKE 1 TABLET(25 MG) BY MOUTH DAILY 07/31/21   Pricilla Holm  A, MD  Lancets (ONETOUCH DELICA PLUS [id number]) MISC USE TO CHECK SUGARS 4 TIMES DAILY 12/01/19   Hoyt Koch, MD  losartan (COZAAR) 50 MG tablet TAKE 1 TABLET(50 MG) BY MOUTH DAILY 10/04/20   Hoyt Koch, MD  meloxicam (MOBIC) 15 MG tablet TAKE 1 TABLET(15 MG) BY MOUTH DAILY WITH FOOD 03/10/20   Hoyt Koch, MD  metFORMIN (GLUCOPHAGE) 1000 MG tablet TAKE 1 TABLET(1000 MG) BY MOUTH TWICE DAILY 07/23/21   Hoyt Koch, MD  methylcellulose (CITRUCEL) oral powder Citrucel: use as directed once daily Patient taking differently: Taking 1 dose as needed 12/01/17   Armbruster, Carlota Raspberry, MD  mometasone (NASONEX) 50 MCG/ACT nasal spray Place 2 sprays into the nose  daily. 07/17/16   [provider]  Multiple Vitamin (MULTIVITAMIN) capsule Take 1 capsule by mouth daily.    [provider]  omeprazole (PRILOSEC) 20 MG capsule TAKE 1 CAPSULE(20 MG) BY MOUTH DAILY 02/13/21   Levin Erp, PA  SYSTANE ULTRA 0.4-0.3 % SOLN SMARTSIG:1 Drop(s) In Eye(s) PRN 10/26/19   [provider]  VITAMIN D PO Take by mouth.    [provider]    Family History Family History  Problem Relation Age of Onset   Diabetes Mother    Anemia Mother    Immunodeficiency Mother    Stroke Father    Dementia Father    Hypertension Father    Stomach cancer Maternal Grandmother    Colon cancer Neg Hx    Prostate cancer Neg Hx    Cancer Neg Hx    Heart disease Neg Hx    COPD Neg Hx    Colon polyps Neg Hx    Esophageal cancer Neg Hx    Rectal cancer Neg Hx     Social History Social History   Tobacco Use   Smoking status: Never   Smokeless tobacco: Never  Vaping Use   Vaping Use: Never used  Substance Use Topics   Alcohol use: No    Alcohol/week: 0.0 standard drinks of alcohol   Drug use: No     Allergies   Patient has no known allergies.   Review of Systems Review of Systems Per HPI  Physical Exam Triage Vital Signs ED Triage Vitals  Enc Vitals Group     BP 08/17/21 1933 133/79     Pulse Rate 08/17/21 1933 75     Resp --      Temp 08/17/21 1933 98.4 F (36.9 C)     Temp Source 08/17/21 1933 Oral     SpO2 08/17/21 1933 95 %     Weight --      Height --      Head Circumference --      Peak Flow --      Pain Score 08/17/21 1936 0     Pain Loc --      Pain Edu? --      Excl. in [place]? --    No data found.  Updated Vital Signs BP 133/79 (BP Location: Left Arm)   Pulse 75   Temp 98.4 F (36.9 C) (Oral)   SpO2 95%   Visual Acuity Right Eye Distance:   Left Eye Distance:   Bilateral Distance:    Right Eye Near:   Left Eye Near:    Bilateral Near:     Physical Exam Constitutional:      General:  He is not in acute distress.    Appearance: Normal appearance. He is  not toxic-appearing or diaphoretic.  HENT:     Head: Normocephalic and atraumatic.     Right Ear: Tympanic membrane and ear canal normal.     Left Ear: Tympanic membrane and ear canal normal.     Nose: Congestion present.     Mouth/Throat:     Mouth: Mucous membranes are moist.     Pharynx: No posterior oropharyngeal erythema.  Eyes:     Extraocular Movements: Extraocular movements intact.     Conjunctiva/sclera: Conjunctivae normal.     Pupils: Pupils are equal, round, and reactive to light.  Cardiovascular:     Rate and Rhythm: Normal rate and regular rhythm.     Pulses: Normal pulses.     Heart sounds: Normal heart sounds.  Pulmonary:     Effort: Pulmonary effort is normal. No respiratory distress.     Breath sounds: Normal breath sounds. No stridor. No wheezing, rhonchi or rales.  Abdominal:     General: Abdomen is flat. Bowel sounds are normal.     Palpations: Abdomen is soft.  Musculoskeletal:        General: Normal range of motion.     Cervical back: Normal range of motion.  Skin:    General: Skin is warm and dry.  Neurological:     General: No focal deficit present.     Mental Status: He is alert and oriented to person, place, and time. Mental status is at baseline.  Psychiatric:        Mood and Affect: Mood normal.        Behavior: Behavior normal.      UC Treatments / Results  Labs (all labs ordered are listed, but only abnormal results are displayed) Labs Reviewed  NOVEL CORONAVIRUS, NAA    EKG   Radiology No results found.  Procedures Procedures (including critical care time)  Medications Ordered in UC Medications - No data to display  Initial Impression / Assessment and Plan / UC Course  I have reviewed the triage vital signs and the nursing notes.  Pertinent labs & imaging results that were available during my care of the patient were reviewed by me and considered in my  medical decision making (see chart for details).     Patient presents with symptoms likely from a viral upper respiratory infection. Differential includes bacterial pneumonia, sinusitis, allergic rhinitis, COVID-19, flu. Do not suspect underlying cardiopulmonary process. Symptoms seem unlikely related to ACS, CHF or COPD exacerbations, pneumonia, pneumothorax. Patient is nontoxic appearing and not in need of emergent medical intervention.  COVID test pending.  Do not think that chest imaging is necessary given no adventitious lung sounds exam and no signs of respiratory compromise.  Vital signs are also stable.  Recommended symptom control with medications. Patient sent prescriptions.   Return if symptoms fail to improve in 1-2 weeks or you develop shortness of breath, chest pain, severe headache. Patient states understanding and is agreeable.  Discharged with PCP followup.  Final Clinical Impressions(s) / UC Diagnoses   Final diagnoses:  Viral upper respiratory tract infection with cough     Discharge Instructions      It appears that you have a viral upper respiratory infection that should run its course and self resolve in the next few days with symptomatic treatment.  You have been prescribed 2 medications to help alleviate symptoms.  COVID test is pending.  We will call if it is positive.  Please follow-up if symptoms persist or worsen.    ED Prescriptions  Medication Sig Dispense Auth. Provider   fluticasone (FLONASE) 50 MCG/ACT nasal spray Place 1 spray into both nostrils daily. 16 g Zavannah Deblois, Hildred Alamin E, [place]   benzonatate (TESSALON) 100 MG capsule Take 1 capsule (100 mg total) by mouth every 8 (eight) hours as needed for cough. 21 capsule [place], Michele Rockers, [place]      PDMP not reviewed this encounter.   Teodora Medici, [place] 08/17/21 2004

## 2021-08-17 NOTE — ED Triage Notes (Signed)
Pt c/o cough hard to talk, feels like he can't breathe outside. X 1 week. Sometimes feels a "rattle" in chest.

## 2021-08-17 NOTE — Discharge Instructions (Signed)
It appears that you have a viral upper respiratory infection that should run its course and self resolve in the next few days with symptomatic treatment.  You have been prescribed 2 medications to help alleviate symptoms.  COVID test is pending.  We will call if it is positive.  Please follow-up if symptoms persist or worsen.

## 2021-08-19 LAB — NOVEL CORONAVIRUS, NAA: SARS-CoV-2, NAA: NOT DETECTED

## 2021-08-25 ENCOUNTER — Encounter: Payer: Self-pay | Admitting: Internal Medicine

## 2021-08-26 NOTE — Progress Notes (Signed)
  Subjective:  Patient ID: Randy Hendrix, male    DOB: [birth year],  MRN: [id number]  Randy Hendrix presents to clinic today for preventative diabetic foot care and painful elongated mycotic toenails 1-5 bilaterally which are tender when wearing enclosed shoe gear. Pain is relieved with periodic professional debridement.  Last A1c was 6.5%. Patient did not check blood glucose today.  He did see Dr. Sherryle Lis for follow up of interdigital tinea pedis and was placed on 4 week course of oral Lamisil.   New problem(s): None.   PCP is Hoyt Koch, MD , and last visit was  February 07, 2021  No Known Allergies  Review of Systems: Negative except as noted in the HPI.  Objective: No changes noted in today's physical examination. General: Patient is a pleasant 56 y.o. African American male morbidly obese in NAD. AAO x 3.   Neurovascular Examination: CFT immediate b/l LE. Palpable DP/PT pulses b/l LE. Digital hair sparse b/l. Skin temperature gradient WNL b/l. No pain with calf compression b/l. No edema noted b/l. No cyanosis or clubbing noted b/l LE.  Protective sensation intact 5/5 intact bilaterally with 10g monofilament b/l. Vibratory sensation intact b/l.  Dermatological:  Pedal integument with normal turgor, texture and tone BLE. Resolved interdigital maceration noted 1st-4th bilateral webspace(s). No blistering, no weeping, no open wounds.  Musculoskeletal:  Normal muscle strength 5/5 to all lower extremity muscle groups bilaterally. Pes planus deformity noted bilateral LE.Marland Kitchen No pain, crepitus or joint limitation noted with ROM b/l LE.  Patient ambulates independently without assistive aids.    Latest Ref Rng & Units 02/07/2021    9:19 AM  Hemoglobin A1C  Hemoglobin-A1c 4.0 - 5.6 % 5.7    Assessment/Plan: 1. Pain due to onychomycosis of toenails of both feet   2. Type 2 diabetes mellitus without complication, without long-term current use of insulin ([place])      -Patient was evaluated and treated. All patient's and/or POA's questions/concerns answered on today's visit. -Patient due for annual diabetic foot examination and we will defer until next visit due to him providing transportation for his wife for a consultation at [facility]. -Continue foot and shoe inspections daily. Monitor blood glucose per PCP/Endocrinologist's recommendations. -Patient to continue soft, supportive shoe gear daily. -Mycotic toenails 1-5 bilaterally were debrided in length and girth with sterile nail nippers and dremel without incident. -Patient/POA to call should there be question/concern in the interim.   Return in about 3 months (around 11/17/2021).  Marzetta Board, DPM

## 2021-08-27 NOTE — Telephone Encounter (Signed)
Pt wife Steva Ready called and is requesting we call her at [phone] instead of calling her husband because he will be at work and unable to answer his phone.   Fyi

## 2021-08-29 ENCOUNTER — Encounter: Payer: Self-pay | Admitting: Family Medicine

## 2021-08-29 ENCOUNTER — Ambulatory Visit (INDEPENDENT_AMBULATORY_CARE_PROVIDER_SITE_OTHER): Payer: 59

## 2021-08-29 ENCOUNTER — Ambulatory Visit (INDEPENDENT_AMBULATORY_CARE_PROVIDER_SITE_OTHER): Payer: 59 | Admitting: Family Medicine

## 2021-08-29 VITALS — BP 118/60 | HR 75 | Temp 96.9°F | Ht 69.0 in | Wt 324.5 lb

## 2021-08-29 DIAGNOSIS — R062 Wheezing: Secondary | ICD-10-CM | POA: Diagnosis not present

## 2021-08-29 DIAGNOSIS — R058 Other specified cough: Secondary | ICD-10-CM

## 2021-08-29 DIAGNOSIS — R5383 Other fatigue: Secondary | ICD-10-CM | POA: Diagnosis not present

## 2021-08-29 DIAGNOSIS — J3089 Other allergic rhinitis: Secondary | ICD-10-CM | POA: Diagnosis not present

## 2021-08-29 LAB — BASIC METABOLIC PANEL
BUN: 17 mg/dL (ref 6–23)
CO2: 30 mEq/L (ref 19–32)
Calcium: 9.7 mg/dL (ref 8.4–10.5)
Chloride: 102 mEq/L (ref 96–112)
Creatinine, Ser: 1.19 mg/dL (ref 0.40–1.50)
GFR: 68.12 mL/min (ref >60.00)
Glucose, Bld: 100 mg/dL — ABNORMAL HIGH (ref 70–99)
Potassium: 4.5 mEq/L (ref 3.5–5.1)
Sodium: 138 mEq/L (ref 135–145)

## 2021-08-29 LAB — CBC WITH DIFFERENTIAL/PLATELET
Basophils Absolute: 0.1 10*3/uL (ref 0.0–0.1)
Basophils Relative: 0.5 % (ref 0.0–3.0)
Eosinophils Absolute: 1.4 10*3/uL — ABNORMAL HIGH (ref 0.0–0.7)
Eosinophils Relative: 12.9 % — ABNORMAL HIGH (ref 0.0–5.0)
HCT: 38.8 % — ABNORMAL LOW (ref 39.0–52.0)
Hemoglobin: 12.1 g/dL — ABNORMAL LOW (ref 13.0–17.0)
Lymphocytes Relative: 23.8 % (ref 12.0–46.0)
Lymphs Abs: 2.6 10*3/uL (ref 0.7–4.0)
MCHC: 31.2 g/dL (ref 30.0–36.0)
MCV: 70 fl — ABNORMAL LOW (ref 78.0–100.0)
Monocytes Absolute: 0.6 10*3/uL (ref 0.1–1.0)
Monocytes Relative: 5.7 % (ref 3.0–12.0)
Neutro Abs: 6.1 10*3/uL (ref 1.4–7.7)
Neutrophils Relative %: 57.1 % (ref 43.0–77.0)
Platelets: 218 10*3/uL (ref 150.0–400.0)
RBC: 5.55 Mil/uL (ref 4.22–5.81)
RDW: 18.8 % — ABNORMAL HIGH (ref 11.5–15.5)
WBC: 10.8 10*3/uL — ABNORMAL HIGH (ref 4.0–10.5)

## 2021-08-29 MED ORDER — AZITHROMYCIN 250 MG PO TABS: ORAL_TABLET | ORAL | 0 refills | Status: AC

## 2021-08-29 MED ORDER — BENZONATATE 100 MG PO CAPS: 100.0000 mg | ORAL_CAPSULE | Freq: Three times a day (TID) | ORAL | 0 refills | Status: DC | PRN

## 2021-08-29 NOTE — Patient Instructions (Signed)
Please go downstairs before you leave today to get blood work and a chest x-ray.  Start the antibiotic as prescribed.   Take Mucinex DM and drink plenty of water.  Continue taking your usual allergy medications.  Follow-up if you are getting worse or not improving at all in the next 2 to 3 days.  We will be in touch with your results.

## 2021-08-29 NOTE — Progress Notes (Signed)
Subjective:  Randy Hendrix is a 56 y.o. male who presents for 3 wk hx of cough.  Dry cough with wheezing at night and shortness of breath.  Notes some chest wall pain with cough.   He was seen at urgent care and diagnosed with a viral URI on 06/30/02023. Prescribed Tessalon and Flonase.  Negative Covid test at [facility].   Denies fever, chills, body aches, dizziness, headache, ear pain, sore throat, palpitations, abdominal pain, N/V/D or LE edema.   Denies asthma, COPD and is not a smoker.   Denies sick contacts.  No other aggravating or relieving factors.  No other c/o.  ROS as in subjective.  Past Medical History:  Diagnosis Date   Allergic rhinitis    ALLERGIC RHINITIS    Allergy    Gastroenteritis    GERD (gastroesophageal reflux disease)    occasionally   Gout    Hemorrhoid    Kidney stone on right side 05/2004   OBESITY, CLASS II    OSA (obstructive sleep apnea)    CPAP qhs   Oxygen deficiency    PATELLAR DISLOCATION, RIGHT    Sleep apnea    wears cpap      Objective: Vitals:   08/29/21 0806  BP: 118/60  Pulse: 75  Temp: (!) 96.9 F (36.1 C)  SpO2: 95%    General appearance: Alert, WD/WN, no distress, mildly ill appearing                             Skin: warm, no rash                           Head: no sinus tenderness                            Eyes: conjunctiva normal, corneas clear, PERRLA                            Ears: pearly TMs, external ear canals normal                          Nose: septum midline, turbinates swollen, with erythema and clear discharge             Mouth/throat: MMM, tongue normal, mild pharyngeal erythema                           Neck: supple, no adenopathy, no thyromegaly, nontender                          Heart: RRR. No LE edema.                          Lungs: CTA bilaterally, no wheezes, rales, or rhonchi      Assessment: Cough present for greater than 3 weeks - Plan: DG Chest 2 View, CBC with Differential/Platelet,  azithromycin (ZITHROMAX) 250 MG tablet, benzonatate (TESSALON) 100 MG capsule, CBC with Differential/Platelet  Wheezing - Plan: DG Chest 2 View, CBC with Differential/Platelet, CBC with Differential/Platelet  Fatigue, unspecified type - Plan: CBC with Differential/Platelet, Basic metabolic panel, Basic metabolic panel, CBC with Differential/Platelet  Non-seasonal allergic rhinitis, unspecified trigger   Plan: No acute distress.  Benign exam. Z-pak prescribed. Tessalon refilled per request. Chest XR and labs ordered due to concerning fatigue.  Suggested symptomatic OTC remedies. Mucinex DM Continue current allergy medications.  Tylenol or Ibuprofen OTC for fever and malaise.  Call/return if worsening or if in 2-3 days if symptoms aren't improving.

## 2021-09-12 ENCOUNTER — Telehealth: Payer: Self-pay | Admitting: Internal Medicine

## 2021-09-12 LAB — HM DIABETES EYE EXAM

## 2021-09-12 NOTE — Telephone Encounter (Signed)
Left message for patient to call back  

## 2021-09-13 NOTE — Telephone Encounter (Signed)
Annetta wife is returning phone call. Annetta phone number is [phone].

## 2021-09-13 NOTE — Telephone Encounter (Signed)
Called and spoke with the pt and his spouse  Pt reports wheezing and cough over the past month  He is coughing up very minimal clear sputum  He was seen at [facility] and prescribed tessalon and a nasal spray without relief  He denies any fevers, aches, SOB  He is not on any inhalers  His daughter will be seeing Dr Annamaria Boots on 09/17/21 and he would like to be seen this day as well Could we use the held slot?  No Known Allergies Current Outpatient Medications on File Prior to Visit  Medication Sig Dispense Refill   allopurinol (ZYLOPRIM) 100 MG tablet TAKE 1 TABLET(100 MG) BY MOUTH DAILY 90 tablet 0   azelastine (ASTELIN) 0.1 % nasal spray USE 2 SPRAYS IN EACH NOSTRIL TWICE DAILY AS NEEDED 30 mL 6   benzonatate (TESSALON) 100 MG capsule Take 1 capsule (100 mg total) by mouth every 8 (eight) hours as needed for cough. 21 capsule 0   Cyanocobalamin (VITAMIN B 12 PO) Take 1 tablet by mouth daily.     dicyclomine (BENTYL) 10 MG capsule Take 1 capsule (10 mg total) by mouth 2 (two) times daily before a meal. 60 capsule 5   fexofenadine (ALLEGRA) 180 MG tablet Take 180 mg by mouth daily.     fluticasone (FLONASE) 50 MCG/ACT nasal spray Place 1 spray into both nostrils daily. 16 g 0   gabapentin (NEURONTIN) 300 MG capsule TAKE 1 CAPSULE(300 MG) BY MOUTH AT BEDTIME 90 capsule 3   Garcinia Cambogia-Chromium 500-200 MG-MCG TABS Take 1 capsule by mouth daily.     glucose blood (ACCU-CHEK GUIDE) test strip TEST FOUR TIMES DAILY AS DIRECTED 100 strip 2   Green Tea, Camillia sinensis, (GREEN TEA PO) Take 1 capsule by mouth daily.     JARDIANCE 25 MG TABS tablet TAKE 1 TABLET(25 MG) BY MOUTH DAILY 90 tablet 3   Lancets (ONETOUCH DELICA PLUS [id number]) MISC USE TO CHECK SUGARS 4 TIMES DAILY 400 each 3   losartan (COZAAR) 50 MG tablet TAKE 1 TABLET(50 MG) BY MOUTH DAILY 90 tablet 3   meloxicam (MOBIC) 15 MG tablet TAKE 1 TABLET(15 MG) BY MOUTH DAILY WITH FOOD 30 tablet 2   metFORMIN (GLUCOPHAGE) 1000 MG tablet TAKE 1  TABLET(1000 MG) BY MOUTH TWICE DAILY 180 tablet 0   methylcellulose (CITRUCEL) oral powder Citrucel: use as directed once daily (Patient taking differently: Taking 1 dose as needed)     mometasone (NASONEX) 50 MCG/ACT nasal spray Place 2 sprays into the nose daily.  1   Multiple Vitamin (MULTIVITAMIN) capsule Take 1 capsule by mouth daily.     omeprazole (PRILOSEC) 20 MG capsule TAKE 1 CAPSULE(20 MG) BY MOUTH DAILY 30 capsule 5   SYSTANE ULTRA 0.4-0.3 % SOLN SMARTSIG:1 Drop(s) In Eye(s) PRN     VITAMIN D PO Take by mouth.     No current facility-administered medications on file prior to visit.

## 2021-09-13 NOTE — Telephone Encounter (Signed)
Spoke with the pt's spouse and scheduled appt with [facility] for 09/17/21 at 9:30.

## 2021-09-13 NOTE — Telephone Encounter (Signed)
Fine to use that held spot as requested

## 2021-09-15 NOTE — Progress Notes (Signed)
HPI  M never smoker followed for OSA, former third shift worker, complicated by morbid obesity, allergic rhinitis, GERD, NPSG 2000:  AHI 124/hr  -----------------------------------------------------------------   06/26/21-  56 year old male never smoker followed for OSA, complicated by GERD, allergic rhinitis, obesity, DM2, Gout,  CPAP  Auto 10-20/[facility] Download-compliance 100%, AHI 0.6/ hr Body weight today-328 lbs Covid vax-4 [employer] Flu vax- had Works second shift. Gets to bed late. Wife says he tends to sit in his chair and fall asleep there instead of getting up and getting into bed.  Discussed.  Download reviewed.  He is very compliant with CPAP and they both say his life is better using it.  There was a question if his machine lost electrical contact for download 1 day.  I reminded him to contact [place] for service if needed. CXR 06/16/20-  IMPRESSION: No active cardiopulmonary disease.  09/17/21-  56 year old male never smoker followed for OSA, complicated by GERD, allergic rhinitis, obesity, DM2, Gout,  CPAP  Auto 10-20/[facility] Download-compliance 100%, AHI 1.1/ hr Body weight today-324 lbs Covid vax-4 Phizer Flu vax- had ED  6/30 for viral URI, then PCP 7/12 for persistent cough> tessalon, mucinex,      EOS H 1.4 Acute visit today for wheeze, SOB, cough.over past few weeks. At onset UC gave Flonase and benzonatate he says didn't help. PCP then gave antibiotic. Now not better, persistent wheeze. No fever. CXR discussed. CXR 08/29/21-  IMPRESSION: Minor central bronchial thickening without acute airspace disease.  ROS-see HPI + = positive Constitutional:    weight loss, night sweats, fevers, chills, fatigue, lassitude. HEENT:    headaches, difficulty swallowing, tooth/dental problems, sore throat,       sneezing, itching, ear ache, nasal congestion, post nasal drip, snoring CV:    chest pain, orthopnea, PND, swelling in lower extremities,  anasarca,                                                    dizziness, palpitations Resp:   shortness of breath with exertion or at rest.                productive cough,  + non-productive cough, coughing up of blood.              change in color of mucus.  +wheezing.   Skin:    rash or lesions. GI:  No-   heartburn, indigestion, abdominal pain, nausea, vomiting, diarrhea,                 change in bowel habits, loss of appetite GU: dysuria, change in color of urine, no urgency or frequency.   flank pain. MS:   joint pain, stiffness, decreased range of motion, back pain. Neuro-     nothing unusual Psych:  change in mood or affect.  depression or anxiety.   memory loss.  OBJ- Physical Exam General- Alert, Oriented, Affect-appropriate, Distress- none acute + obese Skin- rash-none, lesions- none, excoriation- none Lymphadenopathy- none Head- atraumatic            Eyes- Gross vision intact, PERRLA, conjunctivae and secretions clear            Ears- Hearing, canals-normal            Nose- Clear, no-Septal dev, mucus, polyps, erosion, perforation  Throat- Mallampati IV , mucosa clear , drainage- none, tonsils- atrophic Neck- flexible , trachea midline, no stridor , thyroid nl, carotid no bruit Chest - symmetrical excursion , unlabored           Heart/CV- RRR , no murmur , no gallop  , no rub, nl s1 s2                           - JVD- none , edema+1-2+, stasis changes- none, varices- none           Lung- clear to P&A, wheeze+mild, cough- none , dullness-none, rub- none           Chest wall-  Abd-  Br/ Gen/ Rectal- Not done, not indicated Extrem- cyanosis- none, clubbing, none, atrophy- none, strength- nl Neuro- grossly intact to observation

## 2021-09-17 ENCOUNTER — Encounter: Payer: Self-pay | Admitting: Internal Medicine

## 2021-09-17 ENCOUNTER — Ambulatory Visit (INDEPENDENT_AMBULATORY_CARE_PROVIDER_SITE_OTHER): Payer: 59 | Admitting: Internal Medicine

## 2021-09-17 VITALS — BP 128/74 | HR 79 | Ht 69.0 in | Wt 324.0 lb

## 2021-09-17 DIAGNOSIS — R058 Other specified cough: Secondary | ICD-10-CM

## 2021-09-17 DIAGNOSIS — J440 Chronic obstructive pulmonary disease with acute lower respiratory infection: Secondary | ICD-10-CM | POA: Diagnosis not present

## 2021-09-17 DIAGNOSIS — G4733 Obstructive sleep apnea (adult) (pediatric): Secondary | ICD-10-CM | POA: Diagnosis not present

## 2021-09-17 DIAGNOSIS — J209 Acute bronchitis, unspecified: Secondary | ICD-10-CM | POA: Diagnosis not present

## 2021-09-17 MED ORDER — METHYLPREDNISOLONE ACETATE 80 MG/ML IJ SUSP
80.0000 mg | Freq: Once | INTRAMUSCULAR | Status: AC
  Administered 2021-09-17: 80 mg via INTRAMUSCULAR

## 2021-09-17 MED ORDER — TRAMADOL HCL 50 MG PO TABS: ORAL_TABLET | ORAL | 0 refills | Status: DC

## 2021-09-17 NOTE — Patient Instructions (Signed)
Order- Depomedrol 80 mg    dx acute bronchitis  Script sent for tramadol tabs for cough if needed  You can also use otc cough syrup like Delsym, and hard sugar-free candies as throat lozenges, like Zero Sugar Marolyn [employer]

## 2021-09-28 ENCOUNTER — Other Ambulatory Visit: Payer: Self-pay | Admitting: Internal Medicine

## 2021-09-29 ENCOUNTER — Other Ambulatory Visit: Payer: Self-pay | Admitting: Internal Medicine

## 2021-10-19 ENCOUNTER — Encounter: Payer: 59 | Admitting: Internal Medicine

## 2021-10-20 ENCOUNTER — Other Ambulatory Visit: Payer: Self-pay | Admitting: Internal Medicine

## 2021-10-21 ENCOUNTER — Encounter: Payer: Self-pay | Admitting: Internal Medicine

## 2021-10-21 NOTE — Assessment & Plan Note (Signed)
Benefits from CPAP with good comliancee and control Plan- continue auto 10-20

## 2021-10-21 NOTE — Assessment & Plan Note (Signed)
Not prepared to make life-style changes that would accomplish weight loss.
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
## 2021-10-29 ENCOUNTER — Ambulatory Visit (INDEPENDENT_AMBULATORY_CARE_PROVIDER_SITE_OTHER): Payer: 59 | Admitting: Internal Medicine

## 2021-10-29 ENCOUNTER — Encounter: Payer: Self-pay | Admitting: Internal Medicine

## 2021-10-29 VITALS — BP 134/86 | HR 73 | Ht 68.5 in | Wt 319.0 lb

## 2021-10-29 DIAGNOSIS — E1169 Type 2 diabetes mellitus with other specified complication: Secondary | ICD-10-CM

## 2021-10-29 DIAGNOSIS — Z23 Encounter for immunization: Secondary | ICD-10-CM

## 2021-10-29 DIAGNOSIS — Z0000 Encounter for general adult medical examination without abnormal findings: Secondary | ICD-10-CM | POA: Diagnosis not present

## 2021-10-29 DIAGNOSIS — M1A9XX0 Chronic gout, unspecified, without tophus (tophi): Secondary | ICD-10-CM

## 2021-10-29 DIAGNOSIS — I10 Essential (primary) hypertension: Secondary | ICD-10-CM

## 2021-10-29 DIAGNOSIS — R058 Other specified cough: Secondary | ICD-10-CM

## 2021-10-29 LAB — CBC
HCT: 37.2 % — ABNORMAL LOW (ref 39.0–52.0)
Hemoglobin: 11.9 g/dL — ABNORMAL LOW (ref 13.0–17.0)
MCHC: 31.9 g/dL (ref 30.0–36.0)
MCV: 69.3 fl — ABNORMAL LOW (ref 78.0–100.0)
Platelets: 210 10*3/uL (ref 150.0–400.0)
RBC: 5.37 Mil/uL (ref 4.22–5.81)
RDW: 18.8 % — ABNORMAL HIGH (ref 11.5–15.5)
WBC: 9.3 10*3/uL (ref 4.0–10.5)

## 2021-10-29 LAB — LIPID PANEL
Cholesterol: 108 mg/dL (ref 0–200)
HDL: 36 mg/dL — ABNORMAL LOW (ref >39.00)
LDL Cholesterol: 60 mg/dL (ref 0–99)
NonHDL: 72.07
Total CHOL/HDL Ratio: 3
Triglycerides: 61 mg/dL (ref 0.0–149.0)
VLDL: 12.2 mg/dL (ref 0.0–40.0)

## 2021-10-29 LAB — MICROALBUMIN / CREATININE URINE RATIO
Creatinine,U: 84.5 mg/dL
Microalb Creat Ratio: 0.8 mg/g (ref 0.0–30.0)
Microalb, Ur: <0.7 mg/dL (ref 0.0–1.9)

## 2021-10-29 LAB — URIC ACID: Uric Acid, Serum: 4.9 mg/dL (ref 4.0–7.8)

## 2021-10-29 LAB — COMPREHENSIVE METABOLIC PANEL
ALT: 25 U/L (ref 0–53)
AST: 19 U/L (ref 0–37)
Albumin: 4 g/dL (ref 3.5–5.2)
Alkaline Phosphatase: 67 U/L (ref 39–117)
BUN: 15 mg/dL (ref 6–23)
CO2: 29 mEq/L (ref 19–32)
Calcium: 9.5 mg/dL (ref 8.4–10.5)
Chloride: 104 mEq/L (ref 96–112)
Creatinine, Ser: 1.19 mg/dL (ref 0.40–1.50)
GFR: 68.04 mL/min (ref >60.00)
Glucose, Bld: 112 mg/dL — ABNORMAL HIGH (ref 70–99)
Potassium: 4.6 mEq/L (ref 3.5–5.1)
Sodium: 139 mEq/L (ref 135–145)
Total Bilirubin: 1.4 mg/dL — ABNORMAL HIGH (ref 0.2–1.2)
Total Protein: 6.6 g/dL (ref 6.0–8.3)

## 2021-10-29 LAB — PSA: PSA: 0.53 ng/mL (ref 0.10–4.00)

## 2021-10-29 LAB — HEMOGLOBIN A1C: Hgb A1c MFr Bld: 6.3 % (ref 4.6–6.5)

## 2021-10-29 MED ORDER — OMEPRAZOLE 20 MG PO CPDR: 20.0000 mg | DELAYED_RELEASE_CAPSULE | Freq: Every day | ORAL | 2 refills | Status: DC

## 2021-10-29 MED ORDER — AZELASTINE HCL 0.1 % NA SOLN
NASAL | 6 refills | Status: DC
Start: 2021-10-29 — End: 2022-11-01

## 2021-10-29 NOTE — Progress Notes (Signed)
   Subjective:   Patient ID: Randy Hendrix, male    DOB: [birth year], 56 y.o.   MRN: [id number]  HPI The patient is here for physical.  PMH, [facility], social history reviewed and updated  Review of Systems  Constitutional: Negative.   HENT: Negative.    Eyes: Negative.   Respiratory:  Positive for cough. Negative for chest tightness and shortness of breath.   Cardiovascular:  Negative for chest pain, palpitations and leg swelling.  Gastrointestinal:  Positive for abdominal distention and diarrhea. Negative for abdominal pain, constipation, nausea and vomiting.  Musculoskeletal: Negative.   Skin: Negative.   Neurological: Negative.   Psychiatric/Behavioral:  Positive for sleep disturbance.     Objective:  Physical Exam Constitutional:      Appearance: He is well-developed. He is obese.  HENT:     Head: Normocephalic and atraumatic.  Cardiovascular:     Rate and Rhythm: Normal rate and regular rhythm.  Pulmonary:     Effort: Pulmonary effort is normal. No respiratory distress.     Breath sounds: Normal breath sounds. No wheezing or rales.  Abdominal:     General: Bowel sounds are normal. There is no distension.     Palpations: Abdomen is soft.     Tenderness: There is no abdominal tenderness. There is no rebound.  Musculoskeletal:     Cervical back: Normal range of motion.  Skin:    General: Skin is warm and dry.     Comments: Foot exam done  Neurological:     Mental Status: He is alert and oriented to person, place, and time.     Coordination: Coordination normal.     Vitals:   10/29/21 0813  BP: 134/86  Pulse: 73  SpO2: 95%  Weight: (!) 319 lb (144.7 kg)  Height: 5' 8.5" (1.74 m)    Assessment & Plan:  Flu shot given at visit

## 2021-10-29 NOTE — Assessment & Plan Note (Signed)
Still with cough. Suspect GERD/PND. Asked to take omeprazole 20 mg daily instead of prn. He is eating late at night due to work schedule.

## 2021-10-29 NOTE — Assessment & Plan Note (Signed)
Checking uric acid and adjust allopurinol 100 mg daily. No flare today.

## 2021-10-29 NOTE — Assessment & Plan Note (Signed)
BP at goal on losartan 50 mg daily, checking CMP and adjust as needed.

## 2021-10-29 NOTE — Assessment & Plan Note (Signed)
Flu shot given. Shingrix complete. Tetanus up to date. Colonoscopy up to date. Counseled about sun safety and mole surveillance. Counseled about the dangers of distracted driving. Given 10 year screening recommendations.

## 2021-10-29 NOTE — Assessment & Plan Note (Signed)
Foot exam done. Checking microalbumin to creatinine ratio and Hga1c and lipid panel. Will stop metformin due to GI effects and once HgA1c back will decide if need to replace. Keep jardiance 25 mg daily. Taking ARB not on statin.

## 2021-10-29 NOTE — Assessment & Plan Note (Signed)
Referral to weight loss clinic. Taking jardiance and will stop metformin due to side effects. Can consider glp-1.

## 2021-10-30 LAB — TESTOSTERONE TOTAL,FREE,BIO, MALES
Albumin: 4.1 g/dL (ref 3.6–5.1)
Sex Hormone Binding: 16 nmol/L — ABNORMAL LOW (ref 22–77)
Testosterone: 239 ng/dL — ABNORMAL LOW (ref 250–827)

## 2021-11-06 ENCOUNTER — Other Ambulatory Visit: Payer: Self-pay | Admitting: Internal Medicine

## 2021-11-27 ENCOUNTER — Ambulatory Visit (INDEPENDENT_AMBULATORY_CARE_PROVIDER_SITE_OTHER): Payer: 59 | Admitting: Podiatry

## 2021-11-27 ENCOUNTER — Encounter: Payer: Self-pay | Admitting: Podiatry

## 2021-11-27 DIAGNOSIS — L84 Corns and callosities: Secondary | ICD-10-CM

## 2021-11-27 DIAGNOSIS — M79675 Pain in left toe(s): Secondary | ICD-10-CM

## 2021-11-27 DIAGNOSIS — E119 Type 2 diabetes mellitus without complications: Secondary | ICD-10-CM | POA: Diagnosis not present

## 2021-11-27 DIAGNOSIS — B351 Tinea unguium: Secondary | ICD-10-CM

## 2021-11-27 DIAGNOSIS — M79674 Pain in right toe(s): Secondary | ICD-10-CM

## 2021-11-27 DIAGNOSIS — B353 Tinea pedis: Secondary | ICD-10-CM | POA: Diagnosis not present

## 2021-11-27 NOTE — Progress Notes (Signed)
  Subjective:  Patient ID: Randy Hendrix, male    DOB: 07-27-1965,  MRN: [id number]  Randy Hendrix presents to clinic today for:  Chief Complaint  Patient presents with   Nail Problem    Diabetic foot care BS-Did not check today A1C-Do not Know PCP-Elizabeth Crawford PCP VST- Last week    New problem(s): None  Patient has seen Dr. Sherryle Lis for follow up of tinea pedis.   PCP is Hoyt Koch, MD , and last visit was  October 29, 2021.  Dr. Sharlet Salina has completed his annual diabetic foot exam on 10/29/2021.  No Known Allergies  Review of Systems: Negative except as noted in the HPI.  Objective: No changes noted in today's physical examination.  Randy Hendrix is a pleasant 56 y.o. male morbidly obese in NAD. AAO x 3.  Neurovascular Examination: CFT immediate b/l LE. Palpable DP/PT pulses b/l LE. Digital hair sparse b/l. Skin temperature gradient WNL b/l. No pain with calf compression b/l. No edema noted b/l. No cyanosis or clubbing noted b/l LE.  Protective sensation intact 5/5 intact bilaterally with 10g monofilament b/l. Vibratory sensation intact b/l.  Dermatological:  Pedal integument with normal turgor, texture and tone BLE. Pedal integument with normal turgor, texture and tone BLE. No open wounds b/l LE. Interdigital maceration noted right 2-4, left 4th webspace(s). No blistering, no weeping, no open wounds. Toenails 1-5 b/l elongated, discolored, dystrophic, thickened, crumbly with subungual debris and tenderness to dorsal palpation. Hyperkeratotic lesion(s) submet head 5 b/l.  No erythema, no edema, no drainage, no fluctuance.  Musculoskeletal:  Normal muscle strength 5/5 to all lower extremity muscle groups bilaterally. Pes planus deformity noted bilateral LE.Marland Kitchen No pain, crepitus or joint limitation noted with ROM b/l LE.  Patient ambulates independently without assistive aids.  Assessment/Plan: 1. Pain due to onychomycosis of toenails  of both feet   2. Callus   3. Tinea pedis of both feet   4. Type 2 diabetes mellitus without complication, without long-term current use of insulin (HCC)     No orders of the defined types were placed in this encounter.   -Consent given for treatment as described below: -Examined patient. -Diabetic foot examination completed by Dr. Sharlet Salina on 10/29/2021. -Stressed the importance of good glycemic control and the detriment of not  controlling glucose levels in relation to the foot. -Patient to continue soft, supportive shoe gear daily. -For interdigital tinea pedis, he was advised to continue Lotrimin Spray Powder between toes once daily. -Discussed tinea pedis infection. To prevent re-infection of tinea pedis, patient/POA/caregiver instructed to spray shoes with Lysol every evening and clean tub/shower with bleach based cleanser. -Patient/POA to call should there be question/concern in the interim.  -Toenails 1-5 b/l were debrided in length and girth with sterile nail nippers and dremel without iatrogenic bleeding.  -Callus(es) submet head 5 b/l pared utilizing sterile scalpel blade without complication or incident. Total number debrided =2. -Patient/POA to call should there be question/concern in the interim.   Return in about 3 months (around 02/27/2022).  Marzetta Board, DPM

## 2021-12-03 ENCOUNTER — Encounter (INDEPENDENT_AMBULATORY_CARE_PROVIDER_SITE_OTHER): Payer: Self-pay | Admitting: Internal Medicine

## 2021-12-03 ENCOUNTER — Ambulatory Visit (INDEPENDENT_AMBULATORY_CARE_PROVIDER_SITE_OTHER): Payer: 59 | Admitting: Internal Medicine

## 2021-12-03 VITALS — BP 143/81 | HR 74 | Temp 97.9°F | Ht 68.0 in | Wt 318.6 lb

## 2021-12-03 DIAGNOSIS — G4733 Obstructive sleep apnea (adult) (pediatric): Secondary | ICD-10-CM

## 2021-12-03 DIAGNOSIS — I10 Essential (primary) hypertension: Secondary | ICD-10-CM | POA: Diagnosis not present

## 2021-12-03 DIAGNOSIS — Z6841 Body Mass Index (BMI) 40.0 and over, adult: Secondary | ICD-10-CM

## 2021-12-03 DIAGNOSIS — E119 Type 2 diabetes mellitus without complications: Secondary | ICD-10-CM | POA: Diagnosis not present

## 2021-12-03 NOTE — Progress Notes (Signed)
Office: [phone]  /  Fax: [phone]  Initial Visit  Randy Hendrix was seen in clinic today to evaluate for obesity. He is interested in losing weight to improve overall health and reduce the risk of weight related complications.  He was referred by PCP.  His peak weight is 330 pounds.  Weight is trending down he has lost 12 pounds over the last few months.  He endorses having fatigue and also poor endurance.  He denies problems with emotional eating.  Some associated conditions include arthritis of the lumbar spine, high blood pressure, OSA on CPAP and type 2 diabetes.  Contributing factors include positive family history, disruption of the cardia and rhythm, nutritional and night shift work schedule.  He tends to eat late and skips meals.  May overeat at times.  He is currently not following a nutritional plan.  Current physical activity is cycling at work about 3 miles.  He is currently on Jardiance for his diabetes  He presents today to review program treatment options, initial physical assessment, and evaluation.      Past medical history includes:   Past Medical History:  Diagnosis Date   Allergic rhinitis    ALLERGIC RHINITIS    Allergy    Gastroenteritis    GERD (gastroesophageal reflux disease)    occasionally   Gout    Hemorrhoid    Kidney stone on right side 05/2004   OBESITY, CLASS II    OSA (obstructive sleep apnea)    CPAP qhs   Oxygen deficiency    PATELLAR DISLOCATION, RIGHT    Sleep apnea    wears cpap     Objective:   BP (!) 143/81   Pulse 74   Temp 97.9 F (36.6 C)   Ht _0  (1.727 m)   Wt (!) 318 lb 9.6 oz (144.5 kg)   SpO2 98%   BMI 48.44 kg/m  He was weighed on the bioimpedance scale:  Body mass index is 48.44 kg/m.  General:  Alert, oriented and cooperative. Patient is in no acute distress.  Respiratory: Normal respiratory effort, no problems with respiration noted  Extremities: Normal range of motion.    Mental Status: Normal  mood and affect. Normal behavior. Normal judgment and thought content.   Assessment and Plan:  1. Essential hypertension Slightly above target.  He is on losartan.  Goal blood pressure is less than 130/80.  Patient advised to check blood pressure at home to monitor for trends and work with PCP to intensify therapy if goal is not met.  His blood pressure should improve with weight loss therapy.  2. Type 2 diabetes mellitus without complication, without long-term current use of insulin ([place]) His last hemoglobin A1c was 6.3 on 9/23.  He is asymptomatic and on Jardiance he is no longer on metformin.  He will continue medication he will benefit from a low-carb meal plan.  3. OSA (obstructive sleep apnea) Reports good compliance with CPAP therapy.  Currently asymptomatic.  Continue treatment and follow-up with sleep specialist  4. Class 3 severe obesity with serious comorbidity and body mass index (BMI) of 45.0 to 49.9 in adult, unspecified obesity type ([place]) We reviewed weight, associated conditions and contributing factors.  Patient would benefit from a reduced calorie, high-protein meal plan tailored to his REE, which will be determined in a fasting state at his next visit.  We will also assess for cardiometabolic complications and nutritional derangements via serologies.     Obesity Treatment Plan:  He  will work on garnering support from family and friends to begin weight loss journey. Work on eliminating or reducing the presence of highly processed, calorie dense foods in the home. Complete provided nutritional and psychosocial assessment questionnaire.  Assess for cardiometabolic complications and nutritional deficiencies via serologies, see orders. Assess REE via indirect calorimetry at next office visit to guide the creation of a reduced calorie, high protein meal plan to promote loss of fat mass.   Begin to gradually increase physical activity with a goal of 150 minutes a week. We also  recommend strength training 2-3 times a week to preserve and/or increase muscle mass which will aid in the weight loss process.   Randy Hendrix will follow up in the next 1-2 weeks to review the above steps and continue evaluation and treatment.  Obesity Education Performed Today:  He was weighed on the bioimpedance scale and results were discussed and documented in the synopsis.  We discussed obesity as a disease and the importance of a more detailed evaluation of all the factors contributing to the disease.  We discussed the importance of long term lifestyle changes which include nutrition, exercise and behavioral modifications as well as the importance of customizing this to his specific health and social needs.  We discussed the benefits of reaching a healthier weight to alleviate the symptoms of existing conditions and reduce the risks of the biomechanical, metabolic and psychological effects of obesity.  Randy Hendrix appears to be in the action stage of change and states they are ready to start intensive lifestyle modifications and behavioral modifications.  30 minutes was spent today on this visit including the above counseling, pre-visit chart review, and post-visit documentation.  Thomes Dinning, MD

## 2021-12-18 ENCOUNTER — Ambulatory Visit: Payer: 59 | Admitting: Internal Medicine

## 2022-01-03 ENCOUNTER — Other Ambulatory Visit: Payer: Self-pay | Admitting: Internal Medicine

## 2022-01-17 ENCOUNTER — Telehealth: Payer: Self-pay | Admitting: Internal Medicine

## 2022-01-17 DIAGNOSIS — G4733 Obstructive sleep apnea (adult) (pediatric): Secondary | ICD-10-CM

## 2022-01-18 NOTE — Telephone Encounter (Signed)
Called and spoke to patient and he states that neither him or his wife every got their supplies for their cpap. Looked at chart and no order was ever placed to renew cpap supplies. Order placed for him and wife. Verified DME. Nothing further needed

## 2022-01-19 ENCOUNTER — Other Ambulatory Visit: Payer: Self-pay | Admitting: Internal Medicine

## 2022-02-04 ENCOUNTER — Other Ambulatory Visit: Payer: Self-pay | Admitting: Internal Medicine

## 2022-03-11 ENCOUNTER — Other Ambulatory Visit: Payer: Self-pay | Admitting: Internal Medicine

## 2022-03-20 ENCOUNTER — Encounter: Payer: Self-pay | Admitting: Podiatry

## 2022-03-20 ENCOUNTER — Ambulatory Visit (INDEPENDENT_AMBULATORY_CARE_PROVIDER_SITE_OTHER): Payer: 59 | Admitting: Podiatry

## 2022-03-20 VITALS — BP 135/83

## 2022-03-20 DIAGNOSIS — B353 Tinea pedis: Secondary | ICD-10-CM

## 2022-03-20 DIAGNOSIS — M79675 Pain in left toe(s): Secondary | ICD-10-CM | POA: Diagnosis not present

## 2022-03-20 DIAGNOSIS — E119 Type 2 diabetes mellitus without complications: Secondary | ICD-10-CM

## 2022-03-20 DIAGNOSIS — M79674 Pain in right toe(s): Secondary | ICD-10-CM | POA: Diagnosis not present

## 2022-03-20 DIAGNOSIS — B351 Tinea unguium: Secondary | ICD-10-CM

## 2022-03-20 DIAGNOSIS — L84 Corns and callosities: Secondary | ICD-10-CM | POA: Diagnosis not present

## 2022-03-20 NOTE — Progress Notes (Signed)
  Subjective:  Patient ID: Randy Hendrix, male    DOB: 1965-10-28,  MRN: [id number]  Kou Gucciardo presents to clinic today for preventative diabetic foot care and callus(es) b/l feet and painful thick toenails that are difficult to trim. Painful toenails interfere with ambulation. Aggravating factors include wearing enclosed shoe gear. Pain is relieved with periodic professional debridement. Painful calluses are aggravated when weightbearing with and without shoegear. Pain is relieved with periodic professional debridement.  Chief Complaint  Patient presents with   Nail Problem     Routine foot care    New problem(s): None.   PCP is Hoyt Koch, MD.  No Known Allergies  Review of Systems: Negative except as noted in the HPI.  Objective: No changes noted in today's physical examination. Vitals:   03/20/22 0840  BP: 135/83   Tomy Khim is a pleasant 57 y.o. male morbidly obese in NAD. AAO x 3.  Neurovascular Examination: CFT immediate b/l LE. Palpable DP/PT pulses b/l LE. Digital hair sparse b/l. Skin temperature gradient WNL b/l. No pain with calf compression b/l. No edema noted b/l. No cyanosis or clubbing noted b/l LE.  Protective sensation intact 5/5 intact bilaterally with 10g monofilament b/l. Vibratory sensation intact b/l.  Dermatological:  Pedal integument with normal turgor, texture and tone BLE. Pedal integument with normal turgor, texture and tone BLE. No open wounds b/l LE.   Interdigital maceration noted right 2-4, left 4th webspace(s). No blistering, no weeping, no open wounds.   Toenails 1-5 b/l elongated, discolored, dystrophic, thickened, crumbly with subungual debris and tenderness to dorsal palpation.   Hyperkeratotic lesion(s) submet head 5 b/l.  No erythema, no edema, no drainage, no fluctuance.  Musculoskeletal:  Normal muscle strength 5/5 to all lower extremity muscle groups bilaterally. Pes planus deformity noted  bilateral LE.Marland Kitchen No pain, crepitus or joint limitation noted with ROM b/l LE.  Patient ambulates independently without assistive aids.  Assessment/Plan: 1. Pain due to onychomycosis of toenails of both feet   2. Callus   3. Tinea pedis of both feet   4. Type 2 diabetes mellitus without complication, without long-term current use of insulin ([place])    -Patient was evaluated and treated. All patient's and/or POA's questions/concerns answered on today's visit. -Continue supportive shoe gear daily. -Mycotic toenails 1-5 bilaterally were debrided in length and girth with sterile nail nippers and dremel without incident. -Callus(es) submet head 5 b/l pared utilizing sterile scalpel blade without complication or incident. Total number debrided =2. -Continue Lotrimin Spray Powder to webspaces daily. -Patient/POA to call should there be question/concern in the interim.   Return in about 3 months (around 06/18/2022).  Marzetta Board, DPM

## 2022-05-14 ENCOUNTER — Ambulatory Visit (INDEPENDENT_AMBULATORY_CARE_PROVIDER_SITE_OTHER): Payer: 59 | Admitting: Internal Medicine

## 2022-05-14 ENCOUNTER — Encounter: Payer: Self-pay | Admitting: Internal Medicine

## 2022-05-14 VITALS — BP 100/80 | HR 76 | Temp 98.2°F | Ht 68.0 in | Wt 329.0 lb

## 2022-05-14 DIAGNOSIS — F4310 Post-traumatic stress disorder, unspecified: Secondary | ICD-10-CM

## 2022-05-14 DIAGNOSIS — E1169 Type 2 diabetes mellitus with other specified complication: Secondary | ICD-10-CM | POA: Diagnosis not present

## 2022-05-14 LAB — URINALYSIS, ROUTINE W REFLEX MICROSCOPIC
Bilirubin Urine: NEGATIVE
Hgb urine dipstick: NEGATIVE
Ketones, ur: NEGATIVE
Leukocytes,Ua: NEGATIVE
Nitrite: NEGATIVE
Specific Gravity, Urine: 1.02 (ref 1.000–1.030)
Total Protein, Urine: NEGATIVE
Urine Glucose: >=1000 — AB
Urobilinogen, UA: 1 (ref 0.0–1.0)
pH: 6 (ref 5.0–8.0)

## 2022-05-14 LAB — POCT GLYCOSYLATED HEMOGLOBIN (HGB A1C): HbA1c POC (<> result, manual entry): 7.1 % (ref 4.0–5.6)

## 2022-05-14 MED ORDER — ALLOPURINOL 100 MG PO TABS
ORAL_TABLET | ORAL | 3 refills | Status: DC
Start: 2022-05-14 — End: 2022-11-01

## 2022-05-14 MED ORDER — GABAPENTIN 300 MG PO CAPS
ORAL_CAPSULE | ORAL | 3 refills | Status: DC
Start: 2022-05-14 — End: 2022-11-01

## 2022-05-14 NOTE — Progress Notes (Signed)
   Subjective:   Patient ID: Randy Hendrix, male    DOB: 03/12/1965, 57 y.o.   MRN: [id number]  HPI The patient is a 57 YO man coming in for follow up and fatigue new in last 2-3 weeks. A lot of life stress and eating more sweets. Some increased urination.  Review of Systems  Constitutional: Negative.   HENT: Negative.    Eyes: Negative.   Respiratory:  Negative for cough, chest tightness and shortness of breath.   Cardiovascular:  Negative for chest pain, palpitations and leg swelling.  Gastrointestinal:  Negative for abdominal distention, abdominal pain, constipation, diarrhea, nausea and vomiting.  Genitourinary:  Positive for frequency.  Musculoskeletal: Negative.   Skin: Negative.   Neurological: Negative.   Psychiatric/Behavioral: Negative.      Objective:  Physical Exam Constitutional:      Appearance: He is well-developed. He is obese.  HENT:     Head: Normocephalic and atraumatic.  Cardiovascular:     Rate and Rhythm: Normal rate and regular rhythm.  Pulmonary:     Effort: Pulmonary effort is normal. No respiratory distress.     Breath sounds: Normal breath sounds. No wheezing or rales.  Abdominal:     General: Bowel sounds are normal. There is no distension.     Palpations: Abdomen is soft.     Tenderness: There is no abdominal tenderness. There is no rebound.  Musculoskeletal:     Cervical back: Normal range of motion.  Skin:    General: Skin is warm and dry.  Neurological:     Mental Status: He is alert and oriented to person, place, and time.     Coordination: Coordination normal.     Vitals:   05/14/22 0936  BP: 100/80  Pulse: 76  Temp: 98.2 F (36.8 C)  TempSrc: Oral  SpO2: 92%  Weight: (!) 329 lb (149.2 kg)  Height: 5\' 8"  (1.727 m)    Assessment & Plan:  Visit time 25 minutes in face to face communication with patient and coordination of care, additional 5 minutes spent in record review, coordination or care, ordering tests,  communicating/referring to other healthcare professionals, documenting in medical records all on the same day of the visit for total time 30 minutes spent on the visit.

## 2022-05-14 NOTE — Assessment & Plan Note (Signed)
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 Referral done today and encouraged to add exercise and self coping skills.

## 2022-05-14 NOTE — Patient Instructions (Addendum)
The HgA1c is 7.1 and we will check the urine today

## 2022-05-14 NOTE — Assessment & Plan Note (Signed)
POC HgA1c 7.1 today which is at goal but worse than prior. He has been eating more sweets and up 10 pounds since fall. He will keep jardiance 25 mg daily. Checking U/A due to increased frequency to rule out UTI. We discussed adding glp-1 to help with food cravings and he will work on changes himself first. Follow up 3 months to ensure Hga1c not rising.

## 2022-05-22 ENCOUNTER — Encounter: Payer: Self-pay | Admitting: Internal Medicine

## 2022-06-18 ENCOUNTER — Other Ambulatory Visit: Payer: Self-pay | Admitting: Internal Medicine

## 2022-06-21 ENCOUNTER — Ambulatory Visit (INDEPENDENT_AMBULATORY_CARE_PROVIDER_SITE_OTHER): Payer: 59 | Admitting: Behavioral Health

## 2022-06-21 ENCOUNTER — Encounter: Payer: Self-pay | Admitting: Behavioral Health

## 2022-06-21 DIAGNOSIS — F411 Generalized anxiety disorder: Secondary | ICD-10-CM | POA: Diagnosis not present
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
## 2022-06-21 NOTE — Progress Notes (Deleted)
                Randy Hendrix Randy Hendrix, LCMHC 

## 2022-06-25 NOTE — Progress Notes (Signed)
HPI  M never smoker followed for OSA, former third shift worker, complicated by morbid obesity, allergic rhinitis, GERD, NPSG 2000:  AHI 124/hr  ----------------------------------------------------------------  09/17/21-  57 year old male never smoker followed for OSA, complicated by GERD, allergic rhinitis, obesity, DM2, Gout,  CPAP  Auto 10-20/[facility] Download-compliance 100%, AHI 1.1/ hr Body weight today-324 lbs Covid vax-4 Phizer Flu vax- had ED  6/30 for viral URI, then PCP 7/12 for persistent cough> tessalon, mucinex,      EOS H 1.4 Acute visit today for wheeze, SOB, cough.over past few weeks. At onset UC gave Flonase and benzonatate he says didn't help. PCP then gave antibiotic. Now not better, persistent wheeze. No fever. CXR discussed. CXR 08/29/21-  IMPRESSION: Minor central bronchial thickening without acute airspace disease.  06/25/22-  57 year old male never smoker followed for OSA, Second -shift Worker, complicated by GERD, allergic rhinitis, obesity, DM2, Gout, Anxiety,  CPAP  Auto 10-20/[facility] Download-compliance 100%, AHI 0.4/ hr Body weight today-329 lbs -----Pt doing well. No issues with CPAP machine Download reviewed.  He is here with his wife confirms good control.  He works second shift and finds it hard to unwind but then sleeps soundly once asleep.  Had questions about cleaning his machine which we answered.   ROS-see HPI + = positive Constitutional:    weight loss, night sweats, fevers, chills, fatigue, lassitude. HEENT:    headaches, difficulty swallowing, tooth/dental problems, sore throat,       sneezing, itching, ear ache, nasal congestion, post nasal drip, snoring CV:    chest pain, orthopnea, PND, swelling in lower extremities, anasarca,                                                    dizziness, palpitations Resp:   shortness of breath with exertion or at rest.                productive cough,  + non-productive cough, coughing  up of blood.              change in color of mucus.  +wheezing.   Skin:    rash or lesions. GI:  No-   heartburn, indigestion, abdominal pain, nausea, vomiting, diarrhea,                 change in bowel habits, loss of appetite GU: dysuria, change in color of urine, no urgency or frequency.   flank pain. MS:   joint pain, stiffness, decreased range of motion, back pain. Neuro-     nothing unusual Psych:  change in mood or affect.  depression or anxiety.   memory loss.  OBJ- Physical Exam General- Alert, Oriented, Affect-appropriate, Distress- none acute + obese Skin- rash-none, lesions- none, excoriation- none Lymphadenopathy- none Head- atraumatic            Eyes- Gross vision intact, PERRLA, conjunctivae and secretions clear            Ears- Hearing, canals-normal            Nose- Clear, no-Septal dev, mucus, polyps, erosion, perforation             Throat- Mallampati IV , mucosa clear , drainage- none, tonsils- atrophic Neck- flexible , trachea midline, no stridor , thyroid nl, carotid no bruit Chest - symmetrical excursion , unlabored  Heart/CV- RRR , no murmur , no gallop  , no rub, nl s1 s2                           - JVD- none , edema+1, stasis changes- none, varices- none           Lung- + trace basilar crackles/unlabored, wheeze+mild, cough- none , dullness-none, rub- none           Chest wall-  Abd-  Br/ Gen/ Rectal- Not done, not indicated Extrem- cyanosis- none, clubbing, none, atrophy- none, strength- nl Neuro- grossly intact to observation

## 2022-06-27 ENCOUNTER — Ambulatory Visit (INDEPENDENT_AMBULATORY_CARE_PROVIDER_SITE_OTHER): Payer: 59 | Admitting: Internal Medicine

## 2022-06-27 ENCOUNTER — Encounter: Payer: Self-pay | Admitting: Internal Medicine

## 2022-06-27 VITALS — BP 130/68 | HR 65 | Ht 70.0 in | Wt 329.8 lb

## 2022-06-27 DIAGNOSIS — G4733 Obstructive sleep apnea (adult) (pediatric): Secondary | ICD-10-CM | POA: Diagnosis not present

## 2022-06-27 NOTE — Patient Instructions (Signed)
Glad you are doing well.  We can continue CPAP auto 10-20

## 2022-07-02 ENCOUNTER — Ambulatory Visit (INDEPENDENT_AMBULATORY_CARE_PROVIDER_SITE_OTHER): Payer: 59 | Admitting: Podiatry

## 2022-07-02 VITALS — BP 132/72

## 2022-07-02 DIAGNOSIS — L84 Corns and callosities: Secondary | ICD-10-CM

## 2022-07-02 DIAGNOSIS — M79674 Pain in right toe(s): Secondary | ICD-10-CM | POA: Diagnosis not present

## 2022-07-02 DIAGNOSIS — M79675 Pain in left toe(s): Secondary | ICD-10-CM

## 2022-07-02 DIAGNOSIS — B351 Tinea unguium: Secondary | ICD-10-CM | POA: Diagnosis not present

## 2022-07-02 DIAGNOSIS — B353 Tinea pedis: Secondary | ICD-10-CM

## 2022-07-02 DIAGNOSIS — E119 Type 2 diabetes mellitus without complications: Secondary | ICD-10-CM

## 2022-07-02 NOTE — Progress Notes (Signed)
  Subjective:  Patient ID: Randy Hendrix, male    DOB: 31-Oct-1965,  MRN: [id number]  Randy Hendrix presents to clinic today for preventative diabetic foot care and thick, elongated toenails both feet which are tender when wearing enclosed shoe gear. He states he hasn't been consistent using his Lotrimin Spray daily. Chief Complaint  Patient presents with   Nail Problem    DFC BS-do not have to check A1C-7.1 PCP-[facility] PCP VST- Early 2024   New problem(s): None.   PCP is Myrlene Broker, MD.  No Known Allergies  Review of Systems: Negative except as noted in the HPI.  Objective: No changes noted in today's physical examination. There were no vitals filed for this visit. Randy Hendrix is a pleasant 57 y.o. male morbidly obese in NAD. AAO x 3.  Neurovascular Examination: CFT immediate b/l LE. Palpable DP/PT pulses b/l LE. Digital hair sparse b/l. Skin temperature gradient WNL b/l. No pain with calf compression b/l. No edema noted b/l. No cyanosis or clubbing noted b/l LE.  Protective sensation intact 5/5 intact bilaterally with 10g monofilament b/l. Vibratory sensation intact b/l.  Dermatological:  Pedal integument with normal turgor, texture and tone BLE. Pedal integument with normal turgor, texture and tone BLE. No open wounds b/l LE.   Interdigital maceration noted right 2-4, left 4th webspace(s). No blistering, no weeping, no open wounds.   Toenails 1-5 b/l elongated, discolored, dystrophic, thickened, crumbly with subungual debris and tenderness to dorsal palpation.   Hyperkeratotic lesion(s) submet head 5 b/l.  No erythema, no edema, no drainage, no fluctuance.  Musculoskeletal:  Normal muscle strength 5/5 to all lower extremity muscle groups bilaterally. Pes planus deformity noted bilateral LE.Marland Kitchen No pain, crepitus or joint limitation noted with ROM b/l LE.  Patient ambulates independently without assistive aids.  Assessment/Plan: 1. Pain due  to onychomycosis of toenails of both feet   2. Callus   3. Type 2 diabetes mellitus without complication, without long-term current use of insulin (HCC)     -Consent given for treatment as described below: -Examined patient. -Toenails 1-5 b/l were debrided in length and girth with sterile nail nippers and dremel without iatrogenic bleeding.  -Callus(es) submet head 5 b/l pared utilizing sterile scalpel blade without complication or incident. Total number debrided =2. -Encouraged him to continue Lotrimin Antifungal Spray daily. -Patient/POA to call should there be question/concern in the interim.   Return in about 3 months (around 10/02/2022).  Freddie Breech, DPM

## 2022-07-03 ENCOUNTER — Encounter: Payer: Self-pay | Admitting: Podiatry

## 2022-07-12 ENCOUNTER — Ambulatory Visit: Payer: 59 | Admitting: Behavioral Health

## 2022-07-16 ENCOUNTER — Encounter: Payer: Self-pay | Admitting: Internal Medicine

## 2022-07-17 ENCOUNTER — Encounter: Payer: Self-pay | Admitting: Internal Medicine

## 2022-07-17 NOTE — Assessment & Plan Note (Addendum)
With from CPAP with good compliance and control. 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 Plan-continue auto 10-20

## 2022-07-17 NOTE — Assessment & Plan Note (Signed)
Encouragement for diet/exercise

## 2022-07-18 ENCOUNTER — Ambulatory Visit (INDEPENDENT_AMBULATORY_CARE_PROVIDER_SITE_OTHER): Payer: 59 | Admitting: Behavioral Health

## 2022-07-18 ENCOUNTER — Other Ambulatory Visit: Payer: Self-pay | Admitting: Internal Medicine

## 2022-07-18 ENCOUNTER — Encounter: Payer: Self-pay | Admitting: Behavioral Health

## 2022-07-18 DIAGNOSIS — F411 Generalized anxiety disorder: Secondary | ICD-10-CM

## 2022-07-18 DIAGNOSIS — R454 Irritability and anger: Secondary | ICD-10-CM

## 2022-07-18 NOTE — Progress Notes (Signed)
                Kiera Hussey M Dearl Rudden, LCMHC 

## 2022-07-18 NOTE — Progress Notes (Addendum)
[facility] Counselor/Therapist Progress Note  Patient ID: Randy Hendrix, MRN: [id number],    Date: 07/18/2022  Time Spent: 55 minutes spent via video session with the patient.  The patient was at home and this therapist was in his home office.  Treatment Type: Individual Therapy  Reported Symptoms: Anxiety/irritability  Mental Status Exam: Appearance:  Fairly Groomed     Behavior: Appropriate  Motor: Normal  Speech/Language:  Normal Rate  Affect: Appropriate  Mood: normal  Thought process: normal  Thought content:   WNL  Sensory/Perceptual disturbances:   WNL  Orientation: oriented to person, place, time/date, situation, day of week, month of year, and year  Attention: Good  Concentration: Good  Memory: WNL  Fund of knowledge:  Good  Insight:   Good  Judgment:  Good  Impulse Control: Good   Risk Assessment: Danger to Self:  No Self-injurious Behavior: No Danger to Others: No Duty to Warn:no Physical Aggression / Violence:No  Access to Firearms a concern: No  Gang Involvement:No   Subjective: Started, she had working from 1 him until 11:30 PM Sunday through Wednesdays.  As with any shift there are adjustments which point he feels that is going well.  He has started working on some cognitive re framing and being educationally mindful of how he responds to people especially at work.  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  There is no particular person who wants him to pin down at times even if the patient tells me repeatedly that he cannot do that.  He said he had treatment with her request recently as simple 10-4 because he was irritated he did not want to say what he was thinking.  He later explained that he saw the person face-to-face that they were on a job and that he was  In the talked about possible options.  We talked about setting healthy boundaries with those people  and other situations/environments that create stress and anxiety and will receive daily level.  Talked about the cognitive approach to those life situations as well as the emotional response and introduce other coping skills including vagus nerve stimulation as well as progressive muscle relaxation.  I encouraged him to practice those between now and the next session.  His wife asked to be able to speak to me in the next session which the patient agreed to.  I told him I did not discuss anything that we talked about and would simply listen.  He does contract for safety having no thoughts of hurting himself or anyone else.  Interventions: Cognitive Behavioral Therapy and Dialectical Behavioral Therapy  Diagnosis: Generalized anxiety disorder, irritability  Plan: I will meet with the patient virtually every 2 to 3 weeks Target date December 19, 2022.  Progress: 20% Randy Hendrix, [facility]

## 2022-07-27 ENCOUNTER — Other Ambulatory Visit: Payer: Self-pay | Admitting: Internal Medicine

## 2022-08-08 ENCOUNTER — Encounter: Payer: Self-pay | Admitting: Behavioral Health

## 2022-08-08 ENCOUNTER — Ambulatory Visit (INDEPENDENT_AMBULATORY_CARE_PROVIDER_SITE_OTHER): Payer: 59 | Admitting: Behavioral Health

## 2022-08-08 DIAGNOSIS — F411 Generalized anxiety disorder: Secondary | ICD-10-CM | POA: Diagnosis not present
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
## 2022-08-23 ENCOUNTER — Encounter: Payer: Self-pay | Admitting: Behavioral Health

## 2022-08-23 ENCOUNTER — Ambulatory Visit (INDEPENDENT_AMBULATORY_CARE_PROVIDER_SITE_OTHER): Payer: 59 | Admitting: Behavioral Health

## 2022-08-23 DIAGNOSIS — F411 Generalized anxiety disorder: Secondary | ICD-10-CM | POA: Diagnosis not present

## 2022-08-23 DIAGNOSIS — R454 Irritability and anger: Secondary | ICD-10-CM

## 2022-08-23 NOTE — Progress Notes (Signed)
[facility] Counselor/Therapist Progress Note  Patient ID: Randy Hendrix, MRN: [id number],    Date: 08/23/2022  Time Spent: 53, 1103 2 11:56 AM spent via video session with the patient. This session was held via video teletherapy. The patient consented to the video teletherapy and was located in his car during this session. He is aware it is the responsibility of the patient to secure confidentiality on his end of the session. The provider was in a private home office for the duration of this session.    The patient arrived on time for her Caregility session   Treatment Type: Individual Therapy  Reported Symptoms: Anxiety/irritability  Mental Status Exam: Appearance:  Fairly Groomed     Behavior: Appropriate  Motor: Normal  Speech/Language:  Normal Rate  Affect: Appropriate  Mood: normal  Thought process: normal  Thought content:   WNL  Sensory/Perceptual disturbances:   WNL  Orientation: oriented to person, place, time/date, situation, day of week, month of year, and year  Attention: Good  Concentration: Good  Memory: WNL  Fund of knowledge:  Good  Insight:   Good  Judgment:  Good  Impulse Control: Good   Risk Assessment: Danger to Self:  No Self-injurious Behavior: No Danger to Others: No Duty to Warn:no Physical Aggression / Violence:No  Access to Firearms a concern: No  Gang Involvement:No   Subjective: We spoke briefly about the wife expressing her concerns for the patient in the previous session validated that her concerns are legitimate.  He acknowledges a responsible job which is very stressful for him especially in the shift that he works.  He acknowledges being concerned about his wife's and his daughter's health and feels a certain pressure in taking care of them.  He said they notice when he is not quite himself and that concerns him so he at times tries to put on a brave face.  He knows that he is a good husband and a good  father.  The patient has had some dreams on and off for years and had 1 recently in which he feels like he is in a cave type structure and cannot find a way out.  He and his family were on vacation last week and toward the [employer].  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  He told actually new that there was a logical way out but emotionally became somewhat overwhelmed.  We talked about anxiety feeling like you are out of control and started looking at the roots of that and triggers for that over the years including his time in the [name].  We talked about how the responsibilities that he currently have do drive his irritability and anxiety and the importance of self-care.  His wife says that he is distant after work but he sees the need to go to his private "[employer] private room" at the end of the day to decompress.  We talked about how he could convey that message differently so that he sees that he is doing that to better himself so that he in turn is more emotionally and mentally prepared to help them.  We also talked about the emotional mental and physical told that the job he takes has on who he is and working on finding additional coping skills to help alleviate that.  Interventions: Cognitive Behavioral Therapy and Dialectical Behavioral Therapy  Diagnosis: Generalized  anxiety disorder, irritability  Plan: I will meet with the patient virtually every 2 to 3 weeks Target date December 19, 2022.  Progress: 20% Goals will be to improve the patient's ability to manage anxiety and stress in a healthier way, identify causes for anxiety and explore ways to lower them, resolve any core conflicts contributing to anxiety and help the patient manage worrisome thoughts and thinking's contributing to stress and anxiety.  Interventions will include providing education about anxiety and stress to help him identify its causes, facilitate  problem solution skills as well as teach coping skills to manage anxiety symptoms such as grounding exercises, progressive muscle relaxation etc.  We will also use cognitive behavioral therapy to identify and change anxiety provoking thought and behavior patterns as well as use dialectical behavior therapy distress tolerance and mindfulness skills to help him learn better anxiety management skills. French Ana, [facility]

## 2022-09-06 ENCOUNTER — Encounter: Payer: Self-pay | Admitting: Behavioral Health

## 2022-09-06 ENCOUNTER — Ambulatory Visit (INDEPENDENT_AMBULATORY_CARE_PROVIDER_SITE_OTHER): Payer: 59 | Admitting: Behavioral Health

## 2022-09-06 DIAGNOSIS — R454 Irritability and anger: Secondary | ICD-10-CM | POA: Diagnosis not present

## 2022-09-06 DIAGNOSIS — F411 Generalized anxiety disorder: Secondary | ICD-10-CM | POA: Diagnosis not present

## 2022-09-06 NOTE — Progress Notes (Signed)
[facility] Counselor/Therapist Progress Note  Patient ID: Randy Hendrix, MRN: [id number],    Date: 09/06/2022  Time Spent: 55 minutes, 1 PM to 1:55 PM.  This session was held via video teletherapy. The patient consented to the video teletherapy and was located in his car during this session. He is aware it is the responsibility of the patient to secure confidentiality on his end of the session. The provider was in a private home office for the duration of this session.      Treatment Type: Individual Therapy  Reported Symptoms: Anxiety/irritability  Mental Status Exam: Appearance:  Fairly Groomed     Behavior: Appropriate  Motor: Normal  Speech/Language:  Normal Rate  Affect: Appropriate  Mood: normal  Thought process: normal  Thought content:   WNL  Sensory/Perceptual disturbances:   WNL  Orientation: oriented to person, place, time/date, situation, day of week, month of year, and year  Attention: Good  Concentration: Good  Memory: WNL  Fund of knowledge:  Good  Insight:   Good  Judgment:  Good  Impulse Control: Good   Risk Assessment: Danger to Self:  No Self-injurious Behavior: No Danger to Others: No Duty to Warn:no Physical Aggression / Violence:No  Access to Firearms a concern: No  Gang Involvement:No   Subjective: It has been a difficult week for the patient because both his wife and daughter have had some medical flareups.  He had to take 1 day off from work but is now off until Sunday afternoon and his wife and daughter are both feeling better.  This is not unusual for his wife based on some diagnoses but this week was a little more difficult.  He acknowledges the responsibility that falls on him in caring for his wife and daughter but is happy to and thankful to be able to do so.  It did reemphasize the importance for self-care on his part.  He has spoken to his wife about taking some time for himself after work not as avoidance but as a way  of recharge his batteries and feels there has been some more understanding of that.  He said they have been married for 34 years and have learned to work through difficulties that is strengthen their relationship.  They have been going through some things because they want to put their children in better position when the time comes such as going through paperwork from his mom's passing and his wife's parents things.  He wants to leave as little clutter or things to have to deal with as he can.  That also makes things are better at home as there is less clutter and things to work on so they are progressing toward that.  I validated the patient's willingness to work on therapy when the emotional communication is not something that he is comfortable with but did a great job of today. Interventions: Cognitive Behavioral Therapy and Dialectical Behavioral Therapy  Diagnosis: Generalized anxiety disorder, irritability  Plan: I will meet with the patient virtually every 2 to 3 weeks Target date December 19, 2022.  Progress: 30% Goals will be to improve the patient's ability to manage anxiety and stress in a healthier way, identify causes for anxiety and explore ways to lower them, resolve any core conflicts contributing to anxiety and help the patient manage worrisome thoughts and thinking's contributing to stress and anxiety.  Interventions will include providing education about anxiety and stress to help him identify its causes, facilitate problem solution skills as  well as teach coping skills to manage anxiety symptoms such as grounding exercises, progressive muscle relaxation etc.  We will also use cognitive behavioral therapy to identify and change anxiety provoking thought and behavior patterns as well as use dialectical behavior therapy distress tolerance and mindfulness skills to help him learn better anxiety management skills. Randy Hendrix, [facility]                Randy Hendrix, [facility]

## 2022-09-19 ENCOUNTER — Ambulatory Visit (INDEPENDENT_AMBULATORY_CARE_PROVIDER_SITE_OTHER): Payer: 59 | Admitting: Behavioral Health

## 2022-09-19 ENCOUNTER — Encounter: Payer: Self-pay | Admitting: Behavioral Health

## 2022-09-19 DIAGNOSIS — F411 Generalized anxiety disorder: Secondary | ICD-10-CM | POA: Diagnosis not present

## 2022-09-19 NOTE — Progress Notes (Signed)
[facility] Counselor/Therapist Progress Note  Patient ID: Randy Hendrix, MRN: [id number],    Date: 09/19/2022  Time Spent: 50 minutes, 9 AM to 9:50 AM.  This session was held via video teletherapy. The patient consented to the video teletherapy and was located in his home during this session. He is aware it is the responsibility of the patient to secure confidentiality on his end of the session. The provider was in a private home office for the duration of this session.      Treatment Type: Individual Therapy  Reported Symptoms: Anxiety/irritability  Mental Status Exam: Appearance:  Fairly Groomed     Behavior: Appropriate  Motor: Normal  Speech/Language:  Normal Rate  Affect: Appropriate  Mood: normal  Thought process: normal  Thought content:   WNL  Sensory/Perceptual disturbances:   WNL  Orientation: oriented to person, place, time/date, situation, day of week, month of year, and year  Attention: Good  Concentration: Good  Memory: WNL  Fund of knowledge:  Good  Insight:   Good  Judgment:  Good  Impulse Control: Good   Risk Assessment: Danger to Self:  No Self-injurious Behavior: No Danger to Others: No Duty to Warn:no Physical Aggression / Violence:No  Access to Firearms a concern: No  Gang Involvement:No   Subjective: The patient's wife and daughter are having some medical issues so he is spending a lot of the past 2 weeks helping care for them.  His wife has fibromyalgia and has fairly recent flare ups as well as some other medical conditions.  His daughter has a fairly newly diagnosed but very rare condition and they are still learning the pattern and symptoms that come along with that but they include fairly constant pain and significant flare ups.  She is scheduled to have surgery in October of this year on her right foot which they hope will alleviate a lot of the pain.  She had the same thing got to the left foot 2 years ago and it was  beneficial.  The patient has been having some knee pain and he is scheduled to see an orthopedic specialist.  He injured years ago playing baseball and has had some on and off pain but has been more consistent recently.  He rested as much as he can when he is not at work but being on his feet on concrete and at times on his knee is taking a wear and tear on his body.  He estimates working another 7 years and would even consider something that took place where Terrell's body if the right situation would be available.  He is trying to be intentional about self-care both at work and at home.  He is much more mindful of listening to his body and although he does not have a lot of downtime is trying to carve out a few minutes throughout the work schedule and home schedule to both rest physically and reduce his anxiety/stress level through the use of coping skills and quiet time. I validated the patient's willingness to work on therapy when the emotional communication is not something that he is comfortable with but did a great job of today. Interventions: Cognitive Behavioral Therapy and Dialectical Behavioral Therapy  Diagnosis: Generalized anxiety disorder, irritability  Plan: I will meet with the patient virtually every 2 to 3 weeks Target date December 19, 2022.  Progress: 30% Goals will be to improve the patient's ability to manage anxiety and stress in a healthier way, identify causes for  anxiety and explore ways to lower them, resolve any core conflicts contributing to anxiety and help the patient manage worrisome thoughts and thinking's contributing to stress and anxiety.  Interventions will include providing education about anxiety and stress to help him identify its causes, facilitate problem solution skills as well as teach coping skills to manage anxiety symptoms such as grounding exercises, progressive muscle relaxation etc.  We will also use cognitive behavioral therapy to identify and change  anxiety provoking thought and behavior patterns as well as use dialectical behavior therapy distress tolerance and mindfulness skills to help him learn better anxiety management skills. French Ana, [facility]                French Ana, [facility]               French Ana, [facility]

## 2022-09-20 ENCOUNTER — Encounter (HOSPITAL_BASED_OUTPATIENT_CLINIC_OR_DEPARTMENT_OTHER): Payer: Self-pay | Admitting: Student

## 2022-09-20 ENCOUNTER — Ambulatory Visit (HOSPITAL_BASED_OUTPATIENT_CLINIC_OR_DEPARTMENT_OTHER): Payer: 59

## 2022-09-20 ENCOUNTER — Ambulatory Visit (INDEPENDENT_AMBULATORY_CARE_PROVIDER_SITE_OTHER): Payer: 59 | Admitting: Student

## 2022-09-20 DIAGNOSIS — M25561 Pain in right knee: Secondary | ICD-10-CM | POA: Diagnosis not present

## 2022-09-20 MED ORDER — LIDOCAINE HCL 1 % IJ SOLN
4.0000 mL | INTRAMUSCULAR | Status: AC | PRN
  Administered 2022-09-20: 4 mL

## 2022-09-20 MED ORDER — TRIAMCINOLONE ACETONIDE 40 MG/ML IJ SUSP
2.0000 mL | INTRAMUSCULAR | Status: AC | PRN
  Administered 2022-09-20: 2 mL via INTRA_ARTICULAR

## 2022-09-20 NOTE — Progress Notes (Signed)
Chief Complaint: Right knee pain     History of Present Illness:    Randy Hendrix is a 57 y.o. male presenting today for evaluation of his right knee.  Has history of issues with this knee since spending 8 years in the [place] approximately 30 years ago, however pain has been ongoing and worsening over the last week.  Denies any known injury.  Reports that pain is located over the anterior and lateral aspects of the knee.  Denies any significant swelling.  Pain is moderate in severity and is typically felt as a dull soreness.  This is made worse from going down stairs as well as kneeling.  Has been taking Aleve but denies any other treatments.  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   Surgical History:   None  PMH/PSH/Family History/Social History/Meds/Allergies:    Past Medical History:  Diagnosis Date   Allergic rhinitis    ALLERGIC RHINITIS    Allergy    Anxiety    Gastroenteritis    GERD (gastroesophageal reflux disease)    occasionally   Gout    Hemorrhoid    Kidney stone on right side 05/2004   OBESITY, CLASS II    OSA (obstructive sleep apnea)    CPAP qhs   Oxygen deficiency    PATELLAR DISLOCATION, RIGHT    Sleep apnea    wears cpap   Past Surgical History:  Procedure Laterality Date   HEMORRHOID SURGERY     WISDOM TOOTH EXTRACTION     Social History   Socioeconomic History   Marital status: Married    Spouse name: Annetta   Number of children: 2   Years of education: 12   Highest education level: Not on file  Occupational History   Occupation: k Proofreader in maintenance    Employer: [employer]  Tobacco Use   Smoking status: Never   Smokeless tobacco: Never  Vaping Use   Vaping status: Never Used  Substance and Sexual Activity   Alcohol use: No    Alcohol/week: 0.0 standard drinks of alcohol   Drug use:  No   Sexual activity: Yes    Partners: Female  Other Topics Concern   Not on file  Social History Narrative   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 Married 1990. 1 daughter 2002 and 1 son 30. SO-multiple medical problems- but currently stable. Daughter w/ petit mal seizures. Marriage is in good health.     Social Determinants of Health   Financial Resource Strain: Not on file  Food Insecurity: Not on file  Transportation Needs: Not on file  Physical Activity: Not on file  Stress: Not on file  Social Connections: Not on file   Family History  Problem Relation Age of Onset   Diabetes Mother    Anemia Mother    Immunodeficiency Mother    Stroke Father    Dementia Father    Hypertension Father    Stomach cancer Maternal Grandmother    Colon cancer Neg Hx    Prostate cancer Neg Hx    Cancer Neg Hx    Heart disease Neg Hx    COPD Neg Hx    Colon polyps Neg Hx  Esophageal cancer Neg Hx    Rectal cancer Neg Hx    No Known Allergies Current Outpatient Medications  Medication Sig Dispense Refill   allopurinol (ZYLOPRIM) 100 MG tablet TAKE 1 TABLET(100 MG) BY MOUTH DAILY 90 tablet 3   azelastine (ASTELIN) 0.1 % nasal spray USE 2 SPRAYS IN EACH NOSTRIL TWICE DAILY AS NEEDED 30 mL 6   Cyanocobalamin (VITAMIN B 12 PO) Take 1 tablet by mouth daily.     dicyclomine (BENTYL) 10 MG capsule Take 1 capsule (10 mg total) by mouth 2 (two) times daily before a meal. 60 capsule 5   fexofenadine (ALLEGRA) 180 MG tablet Take 180 mg by mouth daily.     gabapentin (NEURONTIN) 300 MG capsule TAKE 1 CAPSULE(300 MG) BY MOUTH AT BEDTIME 90 capsule 3   Garcinia Cambogia-Chromium 500-200 MG-MCG TABS Take 1 capsule by mouth daily.     glucose blood (ACCU-CHEK GUIDE) test strip TEST FOUR TIMES DAILY AS DIRECTED 100 strip 2   Green Tea, Camillia sinensis, (GREEN TEA PO) Take 1 capsule by mouth daily.     JARDIANCE 25 MG TABS tablet TAKE 1 TABLET(25 MG) BY MOUTH DAILY 90  tablet 3   Lancets (ONETOUCH DELICA PLUS LANCET30G) MISC USE TO CHECK SUGARS 4 TIMES DAILY 400 each 3   losartan (COZAAR) 50 MG tablet TAKE 1 TABLET BY MOUTH DAILY 90 tablet 1   meloxicam (MOBIC) 15 MG tablet TAKE 1 TABLET(15 MG) BY MOUTH DAILY WITH FOOD 30 tablet 2   methylcellulose (CITRUCEL) oral powder Citrucel: use as directed once daily (Patient taking differently: Taking 1 dose as needed)     mometasone (NASONEX) 50 MCG/ACT nasal spray Place 2 sprays into the nose daily.  1   Multiple Vitamin (MULTIVITAMIN) capsule Take 1 capsule by mouth daily.     omeprazole (PRILOSEC) 20 MG capsule TAKE 1 CAPSULE(20 MG) BY MOUTH DAILY 90 capsule 2   SYSTANE ULTRA 0.4-0.3 % SOLN SMARTSIG:1 Drop(s) In Eye(s) PRN     traMADol (ULTRAM) 50 MG tablet 1 every 8 hours if needed for cough 40 tablet 0   VITAMIN D PO Take by mouth.     No current facility-administered medications for this visit.   No results found.  Review of Systems:   A ROS was performed including pertinent positives and negatives as documented in the HPI.  Physical Exam :   Constitutional: NAD and appears stated age Neurological: Alert and oriented Psych: Appropriate affect and cooperative There were no vitals taken for this visit.   Comprehensive Musculoskeletal Exam:    Tenderness to palpation medial to the patella and over lateral joint line.  Active range of motion from 0-100 degrees with mild crepitus.  No effusion or edema present.  No laxity with varus or valgus stress.  Negative Lachman.  Some tenderness in the right calf with negative Homan's sign.  Imaging:   Xray (right knee 4 views): Slight medial compartment narrowing.  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    I personally reviewed and interpreted the radiographs.   Assessment:   57 y.o. male with atraumatic acute right knee pain.  Based on his symptoms and exam, I believe his pain is patellofemoral in origin.  Radiographs do show  some patellofemoral osteoarthritis which could be a large contributor.  In discussing treatment options, I have recommended a cortisone injection of the knee as well as at home exercises for quadriceps strengthening.  Patient is agreeable to this plan and injection was performed today  without any complication.  Discussed that if this does not get him significant relief, can consider future referral to physical therapy or MRI if needed.  Plan to assess relief and return to clinic as needed.  Plan :    - Cortisone injection performed of the right knee today under ultrasound-guidance after patient consent - Return to clinic as needed    Procedure Note  Patient: Leovardo Thoman             Date of Birth: Jul 29, 1965           MRN: [id number]             Visit Date: 09/20/2022  Procedures: Visit Diagnoses:  1. Acute pain of right knee     Large Joint Inj: R knee on 09/20/2022 11:58 AM Indications: pain Details: 22 G 1.5 in needle, ultrasound-guided anterolateral approach Medications: 4 mL lidocaine 1 %; 2 mL triamcinolone acetonide 40 MG/ML Outcome: tolerated well, no immediate complications Procedure, treatment alternatives, risks and benefits explained, specific risks discussed. Consent was given by the patient. Patient was prepped and draped in the usual sterile fashion.       I personally saw and evaluated the patient, and participated in the management and treatment plan.  Hazle Nordmann, PA-C Orthopedics  This document was dictated using Conservation officer, historic buildings. A reasonable attempt at proof reading has been made to minimize errors.

## 2022-10-03 ENCOUNTER — Encounter (INDEPENDENT_AMBULATORY_CARE_PROVIDER_SITE_OTHER): Payer: Self-pay

## 2022-10-03 ENCOUNTER — Encounter: Payer: Self-pay | Admitting: Behavioral Health

## 2022-10-03 ENCOUNTER — Ambulatory Visit (INDEPENDENT_AMBULATORY_CARE_PROVIDER_SITE_OTHER): Payer: 59 | Admitting: Behavioral Health

## 2022-10-03 DIAGNOSIS — F411 Generalized anxiety disorder: Secondary | ICD-10-CM | POA: Diagnosis not present

## 2022-10-03 DIAGNOSIS — R454 Irritability and anger: Secondary | ICD-10-CM | POA: Diagnosis not present

## 2022-10-03 NOTE — Progress Notes (Signed)
[facility] Counselor/Therapist Progress Note  Patient ID: Dimitrius Cottam, MRN: [id number],    Date: 10/03/2022  Time Spent: 58 minutes, 11 AM until 11:58 AM.  This session was held via video teletherapy. The patient consented to the video teletherapy and was located in his home during this session. He is aware it is the responsibility of the patient to secure confidentiality on his end of the session. The provider was in a private home office for the duration of this session.      Treatment Type: Individual Therapy  Reported Symptoms: Anxiety/irritability  Mental Status Exam: Appearance:  Fairly Groomed     Behavior: Appropriate  Motor: Normal  Speech/Language:  Normal Rate  Affect: Appropriate  Mood: normal  Thought process: normal  Thought content:   WNL  Sensory/Perceptual disturbances:   WNL  Orientation: oriented to person, place, time/date, situation, day of week, month of year, and year  Attention: Good  Concentration: Good  Memory: WNL  Fund of knowledge:  Good  Insight:   Good  Judgment:  Good  Impulse Control: Good   Risk Assessment: Danger to Self:  No Self-injurious Behavior: No Danger to Others: No Duty to Warn:no Physical Aggression / Violence:No  Access to Firearms a concern: No  Gang Involvement:No   Subjective: The patient was taking his wife to a medical appointment to begin the session but after a few minutes and she went into her appointment he was in his car alone for the remainder of the session.  He acknowledges that he spends a lot of his days off taking his wife and daughter to medical appointments for their health issues but is happy to do that but says at times it can become very stressful.  His wife is in the middle of a fibromyalgia flare up.  He wanted to look at how his wife's health issues affects all areas of their lives.  He and his wife support each other very well but says that when she is not feeling well and is  difficult but when she is feeling well she wants to do those things which she could not do when she was not feeling well.  We talked about being intentionally mindful of interactions with her and also intentionally mindful about how they spend time together so that there is healthy emotional intellectual physical sexual intimacy.  He is trying to be very intentional about creating time for himself at work and he is making cave and says for the most part that is respected.  We talked about intentionally trying to spend time with his wife and daughter so that he can give them time but also have time for himself and his main cave.  Physically he did go to the doctor for his knee and got a cortisone injection, some exercises to do and was encouraged to keep ice on it especially after working on it to keep the swelling down.  He is scheduled to go back in several weeks if it is not feeling better but he said so far he is getting some relief.  He is trying to be mindful of keeping it elevated iced etc. but recognizes there is only a limited amount of things he can do at work but to stay off about as much as he can.  He is using his coping skills.  We did reemphasize the importance of intentional mindfulness for self-care.  Interventions: Cognitive Behavioral Therapy and Dialectical Behavioral Therapy  Diagnosis: Generalized anxiety disorder, irritability  Plan: I will meet with the patient virtually every 2 to 3 weeks Target date December 19, 2022.  Progress: 30% Goals will be to improve the patient's ability to manage anxiety and stress in a healthier way, identify causes for anxiety and explore ways to lower them, resolve any core conflicts contributing to anxiety and help the patient manage worrisome thoughts and thinking's contributing to stress and anxiety.  Interventions will include providing education about anxiety and stress to help him identify its causes, facilitate problem solution skills as well as  teach coping skills to manage anxiety symptoms such as grounding exercises, progressive muscle relaxation etc.  We will also use cognitive behavioral therapy to identify and change anxiety provoking thought and behavior patterns as well as use dialectical behavior therapy distress tolerance and mindfulness skills to help him learn better anxiety management skills. French Ana, [facility]                French Ana, [facility]               French Ana, [facility]               French Ana, [facility]

## 2022-10-17 ENCOUNTER — Ambulatory Visit (INDEPENDENT_AMBULATORY_CARE_PROVIDER_SITE_OTHER): Payer: 59 | Admitting: Behavioral Health

## 2022-10-17 ENCOUNTER — Encounter: Payer: Self-pay | Admitting: Behavioral Health

## 2022-10-17 DIAGNOSIS — F411 Generalized anxiety disorder: Secondary | ICD-10-CM | POA: Diagnosis not present

## 2022-10-17 DIAGNOSIS — R454 Irritability and anger: Secondary | ICD-10-CM

## 2022-10-17 NOTE — Progress Notes (Signed)
[place] [facility] Counselor/Therapist Progress Note  Patient ID: Randy Hendrix, MRN: [id number],    Date: 10/17/2022  Time Spent: 40 minutes, 11:04AM until 11:44 AM.  This session was held via video teletherapy. The patient consented to the video teletherapy and was located in his home during this session. He is aware it is the responsibility of the patient to secure confidentiality on his end of the session. The provider was in a private home office for the duration of this session.      Treatment Type: Individual Therapy  Reported Symptoms: Anxiety/irritability  Mental Status Exam: Appearance:  Fairly Groomed     Behavior: Appropriate  Motor: Normal  Speech/Language:  Normal Rate  Affect: Appropriate  Mood: normal  Thought process: normal  Thought content:   WNL  Sensory/Perceptual disturbances:   WNL  Orientation: oriented to person, place, time/date, situation, day of week, month of year, and year  Attention: Good  Concentration: Good  Memory: WNL  Fund of knowledge:  Good  Insight:   Good  Judgment:  Good  Impulse Control: Good   Risk Assessment: Danger to Self:  No Self-injurious Behavior: No Danger to Others: No Duty to Warn:no Physical Aggression / Violence:No  Access to Firearms a concern: No  Gang Involvement:No   Subjective: The patient's wife and daughters have had some medical issues so it has been a fairly busy week for him but his wife and daughter are feeling better today.  He woke up with the knee that has not been hurting feeling a bit uncomfortable but he has tried elevated most of the day.  He and his family are going to see [employer] [facility] and football tonight and he is looking forward to that and is thankful everyone is well.  There is still some anxiety related to work.  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  His TMA will be out next  week but it will be a short week for the patient as he will work Monday instead of Sunday because of Labor Day and have Wednesday off for his birthday.  The going to visit some family members on his day off so he is looking forward to doing some time away.  He acknowledges his anxiety and stress levels about a 5 or a 6 but he feels that he is managing that as well as he can.  He is taking care of his knees as best he can and said the injection did bring some relief.  He indicates that he is still trying to be very intentional about self-care and the ways that work for him.   He is using his coping skills.  We did reemphasize the importance of intentional mindfulness for self-care.  Interventions: Cognitive Behavioral Therapy and Dialectical Behavioral Therapy  Diagnosis: Generalized anxiety disorder, irritability  Plan: I will meet with the patient virtually every 2 to 3 weeks Target date December 19, 2022.  Progress: 30% Goals will be to improve the patient's ability to manage anxiety and stress in a healthier way, identify causes for anxiety and explore ways to lower them, resolve any core conflicts contributing to anxiety and help the patient manage worrisome thoughts and thinking's contributing to stress and anxiety.  Interventions will include providing education about anxiety and stress to help him identify its causes, facilitate problem solution skills as well as teach  coping skills to manage anxiety symptoms such as grounding exercises, progressive muscle relaxation etc.  We will also use cognitive behavioral therapy to identify and change anxiety provoking thought and behavior patterns as well as use dialectical behavior therapy distress tolerance and mindfulness skills to help him learn better anxiety management skills. French Ana, [facility]                French Ana, [facility]               French Ana, [facility]               French Ana,  [facility]               French Ana, [facility]

## 2022-11-01 ENCOUNTER — Ambulatory Visit (INDEPENDENT_AMBULATORY_CARE_PROVIDER_SITE_OTHER): Payer: 59 | Admitting: Internal Medicine

## 2022-11-01 ENCOUNTER — Encounter: Payer: Self-pay | Admitting: Internal Medicine

## 2022-11-01 ENCOUNTER — Ambulatory Visit (INDEPENDENT_AMBULATORY_CARE_PROVIDER_SITE_OTHER): Payer: 59 | Admitting: Behavioral Health

## 2022-11-01 ENCOUNTER — Encounter: Payer: Self-pay | Admitting: Behavioral Health

## 2022-11-01 VITALS — BP 132/80 | HR 77 | Temp 98.7°F | Ht 70.0 in | Wt 325.0 lb

## 2022-11-01 DIAGNOSIS — M1A9XX0 Chronic gout, unspecified, without tophus (tophi): Secondary | ICD-10-CM

## 2022-11-01 DIAGNOSIS — E1169 Type 2 diabetes mellitus with other specified complication: Secondary | ICD-10-CM

## 2022-11-01 DIAGNOSIS — F411 Generalized anxiety disorder: Secondary | ICD-10-CM

## 2022-11-01 DIAGNOSIS — I10 Essential (primary) hypertension: Secondary | ICD-10-CM | POA: Diagnosis not present

## 2022-11-01 DIAGNOSIS — Z23 Encounter for immunization: Secondary | ICD-10-CM

## 2022-11-01 DIAGNOSIS — Z0000 Encounter for general adult medical examination without abnormal findings: Secondary | ICD-10-CM | POA: Diagnosis not present

## 2022-11-01 DIAGNOSIS — R454 Irritability and anger: Secondary | ICD-10-CM

## 2022-11-01 DIAGNOSIS — Z7985 Long-term (current) use of injectable non-insulin antidiabetic drugs: Secondary | ICD-10-CM

## 2022-11-01 DIAGNOSIS — Z7984 Long term (current) use of oral hypoglycemic drugs: Secondary | ICD-10-CM

## 2022-11-01 LAB — COMPREHENSIVE METABOLIC PANEL
ALT: 39 U/L (ref 0–53)
AST: 28 U/L (ref 0–37)
Albumin: 4.1 g/dL (ref 3.5–5.2)
Alkaline Phosphatase: 68 U/L (ref 39–117)
BUN: 18 mg/dL (ref 6–23)
CO2: 28 meq/L (ref 19–32)
Calcium: 9.4 mg/dL (ref 8.4–10.5)
Chloride: 106 meq/L (ref 96–112)
Creatinine, Ser: 0.92 mg/dL (ref 0.40–1.50)
GFR: 92 mL/min (ref >60.00)
Glucose, Bld: 107 mg/dL — ABNORMAL HIGH (ref 70–99)
Potassium: 4.5 meq/L (ref 3.5–5.1)
Sodium: 139 meq/L (ref 135–145)
Total Bilirubin: 1.3 mg/dL — ABNORMAL HIGH (ref 0.2–1.2)
Total Protein: 7 g/dL (ref 6.0–8.3)

## 2022-11-01 LAB — LIPID PANEL
Cholesterol: 119 mg/dL (ref 0–200)
HDL: 42.6 mg/dL (ref >39.00)
LDL Cholesterol: 66 mg/dL (ref 0–99)
NonHDL: 76.55
Total CHOL/HDL Ratio: 3
Triglycerides: 54 mg/dL (ref 0.0–149.0)
VLDL: 10.8 mg/dL (ref 0.0–40.0)

## 2022-11-01 LAB — HEMOGLOBIN A1C: Hgb A1c MFr Bld: 7.3 % — ABNORMAL HIGH (ref 4.6–6.5)

## 2022-11-01 LAB — MICROALBUMIN / CREATININE URINE RATIO
Creatinine,U: 69.6 mg/dL
Microalb Creat Ratio: 2.5 mg/g (ref 0.0–30.0)
Microalb, Ur: 1.7 mg/dL (ref 0.0–1.9)

## 2022-11-01 LAB — CBC
HCT: 39.6 % (ref 39.0–52.0)
Hemoglobin: 12.1 g/dL — ABNORMAL LOW (ref 13.0–17.0)
MCHC: 30.5 g/dL (ref 30.0–36.0)
MCV: 69.5 fl — ABNORMAL LOW (ref 78.0–100.0)
Platelets: 209 10*3/uL (ref 150.0–400.0)
RBC: 5.7 Mil/uL (ref 4.22–5.81)
RDW: 18 % — ABNORMAL HIGH (ref 11.5–15.5)
WBC: 10.6 10*3/uL — ABNORMAL HIGH (ref 4.0–10.5)

## 2022-11-01 LAB — URIC ACID: Uric Acid, Serum: 4.2 mg/dL (ref 4.0–7.8)

## 2022-11-01 MED ORDER — GABAPENTIN 300 MG PO CAPS: ORAL_CAPSULE | ORAL | 3 refills | Status: DC

## 2022-11-01 MED ORDER — SEMAGLUTIDE (1 MG/DOSE) 4 MG/3ML ~~LOC~~ SOPN: 1.0000 mg | PEN_INJECTOR | SUBCUTANEOUS | 0 refills | Status: DC

## 2022-11-01 MED ORDER — MELOXICAM 15 MG PO TABS: 15.0000 mg | ORAL_TABLET | Freq: Every day | ORAL | 3 refills | Status: DC

## 2022-11-01 MED ORDER — OMEPRAZOLE 20 MG PO CPDR: 20.0000 mg | DELAYED_RELEASE_CAPSULE | Freq: Every day | ORAL | 3 refills | Status: DC

## 2022-11-01 MED ORDER — ACCU-CHEK GUIDE VI STRP: ORAL_STRIP | 2 refills | Status: AC

## 2022-11-01 MED ORDER — AZELASTINE HCL 0.1 % NA SOLN: NASAL | 6 refills | Status: DC

## 2022-11-01 MED ORDER — ONETOUCH DELICA PLUS LANCET30G MISC: 3 refills | Status: AC

## 2022-11-01 MED ORDER — LOSARTAN POTASSIUM 50 MG PO TABS: 50.0000 mg | ORAL_TABLET | Freq: Every day | ORAL | 3 refills | Status: DC

## 2022-11-01 MED ORDER — SEMAGLUTIDE (2 MG/DOSE) 8 MG/3ML ~~LOC~~ SOPN: 2.0000 mg | PEN_INJECTOR | SUBCUTANEOUS | 5 refills | Status: DC

## 2022-11-01 MED ORDER — OZEMPIC (0.25 OR 0.5 MG/DOSE) 2 MG/3ML ~~LOC~~ SOPN: PEN_INJECTOR | SUBCUTANEOUS | 0 refills | Status: AC

## 2022-11-01 MED ORDER — EMPAGLIFLOZIN 25 MG PO TABS: 25.0000 mg | ORAL_TABLET | Freq: Every day | ORAL | 3 refills | Status: DC

## 2022-11-01 MED ORDER — ALLOPURINOL 100 MG PO TABS
ORAL_TABLET | ORAL | 3 refills | Status: DC
Start: 2022-11-01 — End: 2023-10-27

## 2022-11-01 NOTE — Assessment & Plan Note (Signed)
Checking uric acid and adjust allopurinol as needed.

## 2022-11-01 NOTE — Progress Notes (Signed)
Subjective:   Patient ID: Randy Hendrix, male    DOB: Oct 16, 1965, 57 y.o.   MRN: [id number]  HPI The patient is here for physical.  PMH, [facility], social history reviewed and updated  Review of Systems  Constitutional: Negative.   HENT: Negative.    Eyes: Negative.   Respiratory:  Negative for cough, chest tightness and shortness of breath.   Cardiovascular:  Negative for chest pain, palpitations and leg swelling.  Gastrointestinal:  Negative for abdominal distention, abdominal pain, constipation, diarrhea, nausea and vomiting.  Musculoskeletal:  Positive for arthralgias.  Skin: Negative.   Neurological: Negative.   Psychiatric/Behavioral: Negative.      Objective:  Physical Exam Constitutional:      Appearance: He is well-developed. He is obese.  HENT:     Head: Normocephalic and atraumatic.  Cardiovascular:     Rate and Rhythm: Normal rate and regular rhythm.  Pulmonary:     Effort: Pulmonary effort is normal. No respiratory distress.     Breath sounds: Normal breath sounds. No wheezing or rales.  Abdominal:     General: Bowel sounds are normal. There is no distension.     Palpations: Abdomen is soft.     Tenderness: There is no abdominal tenderness. There is no rebound.  Musculoskeletal:        General: Tenderness present.     Cervical back: Normal range of motion.  Skin:    General: Skin is warm and dry.  Neurological:     Mental Status: He is alert and oriented to person, place, and time.     Coordination: Coordination normal.     Vitals:   11/01/22 0859  BP: 132/80  Pulse: 77  Temp: 98.7 F (37.1 C)  TempSrc: Oral  SpO2: 94%  Weight: (!) 325 lb (147.4 kg)  Height: 5\' 10"  (1.778 m)    Assessment & Plan:  Flu shot given at visit

## 2022-11-01 NOTE — Assessment & Plan Note (Signed)
BP at goal on losartan 50 mg daily. Checking CMP and adjust as needed.

## 2022-11-01 NOTE — Assessment & Plan Note (Signed)
Starting ozempic for weight control.

## 2022-11-01 NOTE — Assessment & Plan Note (Signed)
Checking HGA1c, foot exam done. Adjust as needed and he wants to start ozempic for weight management. Depending on HgA1c can stop jardiance if well controlled and switch or add if not well controlled.

## 2022-11-01 NOTE — Assessment & Plan Note (Signed)
Flu shot given. Shingrix complete. Tetanus up to date. Colonoscopy up to date. Counseled about sun safety and mole surveillance. Counseled about the dangers of distracted driving. Given 10 year screening recommendations.

## 2022-11-01 NOTE — Progress Notes (Signed)
  [place] [facility] Counselor/Therapist Progress Note  Patient ID: Randy Hendrix, MRN: [id number],    Date: 11/01/2022  Time Spent: 50 minutes, 11 AM until 11:50 AM.  This session was held via video teletherapy. The patient consented to the video teletherapy and was located in his car during this session. He is aware it is the responsibility of the patient to secure confidentiality on his end of the session. The provider was in a private home office for the duration of this session.      Treatment Type: Individual Therapy  Reported Symptoms: Anxiety/irritability  Mental Status Exam: Appearance:  Fairly Groomed     Behavior: Appropriate  Motor: Normal  Speech/Language:  Normal Rate  Affect: Appropriate  Mood: normal  Thought process: normal  Thought content:   WNL  Sensory/Perceptual disturbances:   WNL  Orientation: oriented to person, place, time/date, situation, day of week, month of year, and year  Attention: Good  Concentration: Good  Memory: WNL  Fund of knowledge:  Good  Insight:   Good  Judgment:  Good  Impulse Control: Good   Risk Assessment: Danger to Self:  No Self-injurious Behavior: No Danger to Others: No Duty to Warn:no Physical Aggression / Violence:No  Access to Firearms a concern: No  Gang Involvement:No   Subjective: Today we focused on communication within relationships including family friends and coworkers.  We looked at factors important in communication including mood, attentive voice, volume.  Talked about how personalities affect communication and how we are over time in relationship the effectiveness of communication.  The patient acknowledges some anxiety with work stress family health issues especially her daughter's upcoming surgery so we reviewed anxiety reduction techniques including relaxation breathing and grounding exercise. He indicates that he is still trying to be very intentional about self-care and the ways that work for  him.   He is using his coping skills.  We did reemphasize the importance of intentional mindfulness for self-care.  Interventions: Cognitive Behavioral Therapy and Dialectical Behavioral Therapy  Diagnosis: Generalized anxiety disorder, irritability  Plan: I will meet with the patient virtually every 2 to 3 weeks Target date December 19, 2022.  Progress: 30% Goals will be to improve the patient's ability to manage anxiety and stress in a healthier way, identify causes for anxiety and explore ways to lower them, resolve any core conflicts contributing to anxiety and help the patient manage worrisome thoughts and thinking's contributing to stress and anxiety.  Interventions will include providing education about anxiety and stress to help him identify its causes, facilitate problem solution skills as well as teach coping skills to manage anxiety symptoms such as grounding exercises, progressive muscle relaxation etc.  We will also use cognitive behavioral therapy to identify and change anxiety provoking thought and behavior patterns as well as use dialectical behavior therapy distress tolerance and mindfulness skills to help him learn better anxiety management skills. French Ana, [facility]                French Ana, [facility]               French Ana, [facility]               French Ana, [facility]               French Ana, [facility]               French Ana, [facility]

## 2022-11-06 ENCOUNTER — Ambulatory Visit: Payer: 59 | Admitting: Podiatry

## 2022-11-09 ENCOUNTER — Other Ambulatory Visit: Payer: Self-pay | Admitting: Internal Medicine

## 2022-11-12 ENCOUNTER — Encounter: Payer: Self-pay | Admitting: Podiatry

## 2022-11-12 ENCOUNTER — Ambulatory Visit (INDEPENDENT_AMBULATORY_CARE_PROVIDER_SITE_OTHER): Payer: 59 | Admitting: Podiatry

## 2022-11-12 VITALS — BP 144/77 | HR 73

## 2022-11-12 DIAGNOSIS — M79674 Pain in right toe(s): Secondary | ICD-10-CM | POA: Diagnosis not present

## 2022-11-12 DIAGNOSIS — L84 Corns and callosities: Secondary | ICD-10-CM | POA: Diagnosis not present

## 2022-11-12 DIAGNOSIS — E119 Type 2 diabetes mellitus without complications: Secondary | ICD-10-CM

## 2022-11-12 DIAGNOSIS — M79675 Pain in left toe(s): Secondary | ICD-10-CM | POA: Diagnosis not present

## 2022-11-12 DIAGNOSIS — B351 Tinea unguium: Secondary | ICD-10-CM

## 2022-11-13 NOTE — Progress Notes (Signed)
Subjective:  Patient ID: Randy Hendrix, male    DOB: 10/24/1965,  MRN: [id number]  57 y.o. male presents preventative diabetic foot care and callus(es) left foot and painful thick toenails that are difficult to trim. Painful toenails interfere with ambulation. Aggravating factors include wearing enclosed shoe gear. Pain is relieved with periodic professional debridement. Painful calluses are aggravated when weightbearing with and without shoegear. Pain is relieved with periodic professional debridement.  Chief Complaint  Patient presents with   Diabetes    DFC BS - LAST THURSDAY 101, ONLY CHECKS IT ONCE A WEEK A1C - 7.1 LVPCP - 10/2022    New problem(s): None   PCP is Myrlene Broker, MD.  No Known Allergies  Review of Systems: Negative except as noted in the HPI.   Objective:  Randy Hendrix is a pleasant 57 y.o. male morbidly obese in NAD.Marland Kitchen AAO x 3.  Vascular Examination: Vascular status intact b/l with palpable pedal pulses. CFT immediate b/l. Pedal hair present. No edema. No pain with calf compression b/l. Skin temperature gradient WNL b/l. No varicosities noted. No cyanosis or clubbing noted.  Neurological Examination: Sensation grossly intact b/l with 10 gram monofilament. Vibratory sensation intact b/l.  Dermatological Examination: Pedal skin with normal turgor, texture and tone b/l. No open wounds nor interdigital macerations noted. Toenails 1-5 b/l thick, discolored, elongated with subungual debris and pain on dorsal palpation.   Hyperkeratotic lesion(s) submet head 5 left foot.  No erythema, no edema, no drainage, no fluctuance.  Musculoskeletal Examination: Muscle strength 5/5 to b/l LE.  No pain, crepitus noted b/l. Patient ambulates independently without assistive aids. Pes planus b/l.  Radiographs: None Last A1c:      Latest Ref Rng & Units 11/01/2022   10:27 AM 05/14/2022   10:03 AM  Hemoglobin A1C  Hemoglobin-A1c 4.6 - 6.5 % 7.3  7.1       Assessment:   1. Pain due to onychomycosis of toenails of both feet   2. Callus   3. Type 2 diabetes mellitus without complication, without long-term current use of insulin (HCC)    Plan:  -Consent given for treatment as described below: -Examined patient. -Continue foot and shoe inspections daily. Monitor blood glucose per PCP/Endocrinologist's recommendations. -Patient to continue soft, supportive shoe gear daily. -Toenails 1-5 b/l were debrided in length and girth with sterile nail nippers and dremel without iatrogenic bleeding.  -Callus(es) submet head 5 left foot pared utilizing sterile scalpel blade without complication or incident. Total number debrided =1. -Patient/POA to call should there be question/concern in the interim.  No follow-ups on file.  Freddie Breech, DPM

## 2022-11-14 ENCOUNTER — Ambulatory Visit: Payer: 59 | Admitting: Behavioral Health

## 2022-11-14 ENCOUNTER — Encounter: Payer: Self-pay | Admitting: Behavioral Health

## 2022-11-14 DIAGNOSIS — F331 Major depressive disorder, recurrent, moderate: Secondary | ICD-10-CM

## 2022-11-14 DIAGNOSIS — R454 Irritability and anger: Secondary | ICD-10-CM | POA: Diagnosis not present

## 2022-11-14 DIAGNOSIS — F411 Generalized anxiety disorder: Secondary | ICD-10-CM

## 2022-11-14 NOTE — Progress Notes (Signed)
[facility] Counselor/Therapist Progress Note  Patient ID: Randy Hendrix, MRN: [id number],    Date: 11/14/2022  Time Spent: 57 minutes, 9 AM until 9:57 AM.  This session was held via video teletherapy. The patient consented to the video teletherapy and was located in his car during this session. He is aware it is the responsibility of the patient to secure confidentiality on his end of the session. The provider was in a private home office for the duration of this session.      Treatment Type: Individual Therapy  Reported Symptoms: Anxiety/irritability  Mental Status Exam: Appearance:  Fairly Groomed     Behavior: Appropriate  Motor: Normal  Speech/Language:  Normal Rate  Affect: Appropriate  Mood: normal  Thought process: normal  Thought content:   WNL  Sensory/Perceptual disturbances:   WNL  Orientation: oriented to person, place, time/date, situation, day of week, month of year, and year  Attention: Good  Concentration: Good  Memory: WNL  Fund of knowledge:  Good  Insight:   Good  Judgment:  Good  Impulse Control: Good   Risk Assessment: Danger to Self:  No Self-injurious Behavior: No Danger to Others: No Duty to Warn:no Physical Aggression / Violence:No  Access to Firearms a concern: No  Gang Involvement:No   Subjective: There is some increased work stress for the patient as 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  They are actively posting the job internally and externally but it might be a few weeks before they get someone else on board so he is having to work some extra hours or different shift schedules.  He is mindful of how much she can do in that time without the help that he is used to having.  He is trying to build in some self-care time but right now that is limited.  He is having some vacation time coming up starting on 17 October.  He is taking a week before his daughter's surgery because it isHomecoming weekend and he  has friends and family coming in.  He is taking a week after that to help care for his daughter and wife.  He also has a short week at work next week.  He recognizes that some new medical issues with his wife as well as daughter's issues and some of his own are very stressful for him but he feels that he is coping as best as possible.  He talked about 2 places where he feels relaxed are at football games and in church.  He feels that way at home to but is also very busy there.  He talked about recognizing that most of the time he feels somewhat like he is on guard.  He recognizes a natural part of that as protecting his family but says he feels that he is dealing with that in a healthy way.  He indicates that he is still trying to be very intentional about self-care and the ways that work for him.   He is using his coping skills.  We did reemphasize the importance of intentional mindfulness for self-care.  Interventions: Cognitive Behavioral Therapy and Dialectical Behavioral Therapy  Diagnosis: Generalized anxiety disorder, irritability  Plan: I will meet with the patient virtually every 2 to 3 weeks Target date December 19, 2022.  Progress: 35% Goals will be to improve the patient's ability to manage anxiety and stress in a healthier way, identify causes for anxiety and explore ways to lower them, resolve any  core conflicts contributing to anxiety and help the patient manage worrisome thoughts and thinking's contributing to stress and anxiety.  Interventions will include providing education about anxiety and stress to help him identify its causes, facilitate problem solution skills as well as teach coping skills to manage anxiety symptoms such as grounding exercises, progressive muscle relaxation etc.  We will also use cognitive behavioral therapy to identify and change anxiety provoking thought and behavior patterns as well as use dialectical behavior therapy distress tolerance and mindfulness skills  to help him learn better anxiety management skills. French Ana, [facility]                French Ana, [facility]               French Ana, [facility]               French Ana, [facility]               French Ana, [facility]               French Ana, [facility]               French Ana, [facility]

## 2022-11-29 ENCOUNTER — Encounter: Payer: Self-pay | Admitting: Behavioral Health

## 2022-11-29 ENCOUNTER — Ambulatory Visit: Payer: 59 | Admitting: Behavioral Health

## 2022-11-29 DIAGNOSIS — R454 Irritability and anger: Secondary | ICD-10-CM

## 2022-11-29 DIAGNOSIS — F411 Generalized anxiety disorder: Secondary | ICD-10-CM

## 2022-11-29 NOTE — Progress Notes (Signed)
[facility] Counselor/Therapist Progress Note  Patient ID: Randy Hendrix, MRN: [id number],    Date: 11/29/2022  Time Spent: 53 minutes, 9 AM until 9:53 AM.  This session was held via video teletherapy. The patient consented to the video teletherapy and was located in his car during this session. He is aware it is the responsibility of the patient to secure confidentiality on his end of the session. The provider was in a private home office for the duration of this session.      Treatment Type: Individual Therapy  Reported Symptoms: Anxiety/irritability  Mental Status Exam: Appearance:  Fairly Groomed     Behavior: Appropriate  Motor: Normal  Speech/Language:  Normal Rate  Affect: Appropriate  Mood: normal  Thought process: normal  Thought content:   WNL  Sensory/Perceptual disturbances:   WNL  Orientation: oriented to person, place, time/date, situation, day of week, month of year, and year  Attention: Good  Concentration: Good  Memory: WNL  Fund of knowledge:  Good  Insight:   Good  Judgment:  Good  Impulse Control: Good   Risk Assessment: Danger to Self:  No Self-injurious Behavior: No Danger to Others: No Duty to Warn:no Physical Aggression / Violence:No  Access to Firearms a concern: No  Gang Involvement:No   Subjective: The patient does now have some help at work.  He had been doing somewhat the same thing in a different building so there is somewhat of an adjustment.  In him learning the way things are done in the patient's area but they are learning to work together.  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  He is taking 2 weeks off a week before his daughter's surgery and a week off.  The week before will be a andTs homecoming and he has family and friends coming in.  He is looking forward to that.  There is some anxiety about his daughter's surgery but he wife and daughter all  hopefully will make a significant difference for her.  He does report that 1 day this week he had what he called a beginning "why me " day.  He said he woke up just feeling overwhelmed that was all going on has both wife and daughter are in walking boots and he feels that he has a lot of responsibility.  A longtime friend texted him that morning in a very timely way not knowing all that was going on but it was very encouraging to the patient.  He said it got better as the day went on.  I encouraged him to continue to reach out to friends and family like that and that he trust as a way to have some support with all that he is going on.  He feels that he is using all the coping skills that he can.  For the most part his leg is doing well although there are some occasional twinges.  He woke up with a cramp 1 night but feels it was because of all the work that he had done that day at his job.  He and his wife are looking at joining a gym because she is doing water rehab and he knows it would be good for both of them.  He used to swim really well and has gotten out of the habit so I encouraged him to pursue that also. He indicates that he is still trying  to be very intentional about self-care and the ways that work for him.   He is using his coping skills.  We did reemphasize the importance of intentional mindfulness for self-care.  Interventions: Cognitive Behavioral Therapy and Dialectical Behavioral Therapy  Diagnosis: Generalized anxiety disorder, irritability  Plan: I will meet with the patient virtually every 2 to 3 weeks.  Reviewed treatment plan on November 1 session. Target date December 19, 2022.  Progress: 35% Goals will be to improve the patient's ability to manage anxiety and stress in a healthier way, identify causes for anxiety and explore ways to lower them, resolve any core conflicts contributing to anxiety and help the patient manage worrisome thoughts and thinking's contributing to stress  and anxiety.  Interventions will include providing education about anxiety and stress to help him identify its causes, facilitate problem solution skills as well as teach coping skills to manage anxiety symptoms such as grounding exercises, progressive muscle relaxation etc.  We will also use cognitive behavioral therapy to identify and change anxiety provoking thought and behavior patterns as well as use dialectical behavior therapy distress tolerance and mindfulness skills to help him learn better anxiety management skills. French Ana, [facility]                French Ana, [facility]               French Ana, [facility]               French Ana, [facility]               French Ana, [facility]               French Ana, [facility]               French Ana, [facility]               French Ana, [facility]

## 2022-12-15 ENCOUNTER — Other Ambulatory Visit: Payer: Self-pay | Admitting: Internal Medicine

## 2022-12-20 ENCOUNTER — Ambulatory Visit: Payer: 59 | Admitting: Behavioral Health

## 2022-12-20 ENCOUNTER — Encounter: Payer: Self-pay | Admitting: Behavioral Health

## 2022-12-20 DIAGNOSIS — R454 Irritability and anger: Secondary | ICD-10-CM | POA: Diagnosis not present

## 2022-12-20 DIAGNOSIS — F411 Generalized anxiety disorder: Secondary | ICD-10-CM

## 2022-12-20 NOTE — Progress Notes (Signed)
[facility] Counselor/Therapist Progress Note  Patient ID: Davidjames Blansett, MRN: [id number],    Date: 12/20/2022  Time Spent: 56 minutes, 10AM until 10:56 AM.  This session was held via video teletherapy. The patient consented to the video teletherapy and was located in his car during this session. He is aware it is the responsibility of the patient to secure confidentiality on his end of the session. The provider was in a private home office for the duration of this session.      Treatment Type: Individual Therapy  Reported Symptoms: Anxiety/irritability  Mental Status Exam: Appearance:  Fairly Groomed     Behavior: Appropriate  Motor: Normal  Speech/Language:  Normal Rate  Affect: Appropriate  Mood: normal  Thought process: normal  Thought content:   WNL  Sensory/Perceptual disturbances:   WNL  Orientation: oriented to person, place, time/date, situation, day of week, month of year, and year  Attention: Good  Concentration: Good  Memory: WNL  Fund of knowledge:  Good  Insight:   Good  Judgment:  Good  Impulse Control: Good   Risk Assessment: Danger to Self:  No Self-injurious Behavior: No Danger to Others: No Duty to Warn:no Physical Aggression / Violence:No  Access to Firearms a concern: No  Gang Involvement:No   Subjective: The patient is tired.  His daughter's surgery went well but he and his wife have had to help care for her over the past couple of weeks and recovering from surgery.  His wife has had some health issues.  He is back has been hurting more recently.  There are some other physical issues with him including his knee, his ankle, his hands.  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  His wife and his buddy from the military are encouraging him to continue to pursue that process as a lot of the health issues have their origins and his time  in the [name].  I encouraged him to do the same thing we talked about the importance of compartmentalization.  He has had very little time for self-care recently but is going to his universities football game tomorrow which she always enjoys.  He also went to their homecoming game 2 weeks ago and got to spend a lot of time with family and friends whom he does not get to see as often.  He and his wife are trying to go to their nieces softball games.  His daughter is getting to the point where she can better take care of her self and goes back to the doctor in another week or so to follow up.  He feels this surgery was fairly successful which she is thankful for.   He indicates that he is still trying to be very intentional about self-care and the ways that work for him.   He is using his coping skills.  We did reemphasize the importance of intentional mindfulness for self-care.  Interventions: Cognitive Behavioral Therapy and Dialectical Behavioral Therapy  Diagnosis: Generalized anxiety disorder, irritability  Plan: I will meet with the patient virtually every 2 to 3 weeks.  Reviewed treatment plan on November 1 session. Target date December 19, 2022.  Progress: 35% Goals will be to improve the patient's ability to manage anxiety and stress in a healthier way, identify causes for anxiety and explore ways to lower them, resolve any core conflicts contributing to anxiety and help the patient manage worrisome thoughts  and thinking's contributing to stress and anxiety.  Interventions will include providing education about anxiety and stress to help him identify its causes, facilitate problem solution skills as well as teach coping skills to manage anxiety symptoms such as grounding exercises, progressive muscle relaxation etc.  We will also use cognitive behavioral therapy to identify and change anxiety provoking thought and behavior patterns as well as use dialectical behavior therapy distress  tolerance and mindfulness skills to help him learn better anxiety management skills. French Ana, [facility]                French Ana, [facility]               French Ana, [facility]               French Ana, [facility]               French Ana, [facility]               French Ana, [facility]               French Ana, [facility]               French Ana, [facility]               French Ana, [facility]

## 2023-01-01 ENCOUNTER — Encounter: Payer: Self-pay | Admitting: Physical Medicine and Rehabilitation

## 2023-01-01 ENCOUNTER — Telehealth: Payer: Self-pay | Admitting: Physical Medicine and Rehabilitation

## 2023-01-01 ENCOUNTER — Other Ambulatory Visit (INDEPENDENT_AMBULATORY_CARE_PROVIDER_SITE_OTHER): Payer: 59

## 2023-01-01 ENCOUNTER — Ambulatory Visit: Payer: 59 | Admitting: Physical Medicine and Rehabilitation

## 2023-01-01 VITALS — BP 111/74 | HR 78

## 2023-01-01 DIAGNOSIS — M5441 Lumbago with sciatica, right side: Secondary | ICD-10-CM

## 2023-01-01 DIAGNOSIS — M5416 Radiculopathy, lumbar region: Secondary | ICD-10-CM

## 2023-01-01 DIAGNOSIS — G8929 Other chronic pain: Secondary | ICD-10-CM

## 2023-01-01 DIAGNOSIS — M5442 Lumbago with sciatica, left side: Secondary | ICD-10-CM | POA: Diagnosis not present

## 2023-01-01 MED ORDER — DIAZEPAM 5 MG PO TABS
ORAL_TABLET | ORAL | 0 refills | Status: DC
Start: 2023-01-01 — End: 2023-02-07

## 2023-01-01 NOTE — Telephone Encounter (Signed)
Pt's wife called requesting tramadol and muscle relaxer. Please send meds to [facility] on [address]. Pt phone number is [phone].

## 2023-01-01 NOTE — Progress Notes (Addendum)
Randy Hendrix - 57 y.o. male MRN [id number]  Date of birth: 1965/10/10  Office Visit Note: Visit Date: 01/01/2023 PCP: Myrlene Broker, MD Referred by: Myrlene Broker, *  Subjective: Chief Complaint  Patient presents with   Lower Back - Pain   HPI: Randy Hendrix is a 57 y.o. male who comes in today as a self referral for evaluation of chronic, worsening and severe bilateral lower back pain, intermittent shooting pains down posterior legs and cramping to calves. Pain ongoing for several years since being released from [employer] in 1992. Pain worsens with movement and activity, describes pain as sore, pressure and throbbing sensation, currently rates as 5 out of 10. Some relief of pain with home exercise regimen, rest and use of medications. Some relief of pain with Aleve. He is currently taking Gabapentin 300 mg daily. No history of dedicated physical therapy for his back. No prior imaging of lumbar spine. No history of lumbar surgery/injections. [facility], I am unable to see these [facility]. Patient denies focal weakness, numbness and tingling. No recent trauma or falls.   atients course is complicated by diabetes mellitus, obesity, anxiety and PTSD. Most recent A1C in September was 7.3.     Review of Systems  Musculoskeletal:  Positive for back pain.  Neurological:  Negative for tingling, sensory change, focal weakness and weakness.  All other systems reviewed and are negative.  Otherwise per HPI.  Assessment & Plan: Visit Diagnoses:    ICD-10-CM   1. Chronic bilateral low back pain with bilateral sciatica  M54.42 XR Lumbar Spine Complete   M54.41 MR LUMBAR SPINE WO CONTRAST   G89.29     2. Lumbar radiculopathy  M54.16 XR Lumbar Spine Complete    MR LUMBAR SPINE WO CONTRAST       Plan: Findings:  Chronic, worsening and severe bilateral lower back pain, intermittent shooting sensation to bilateral posterior legs. Patient  continues to have severe pain despite good conservative therapies such as home exercise regimen, rest and use of medications. Patients clinical presentation and exam are consistent with lumbar radiculopathy. I also feel there is a myofascial pain contributing to his symptoms as there is tenderness noted to bilateral lumbar paraspinal region upon palpation today. I obtained lumbar radiographs in the office today that shows grade 1 anterolisthesis and biforaminal narrowing at L4-L5. Given the chronicity and worsening of his symptoms I placed order for lumbar MRI imaging. Depending on results of MRI imaging we discussed possibility of performing lumbar epidural steroid injections. He did voice issues with anxiety and claustrophobia, I prescribed pre-procedure Valium for him to take on day of imaging. I also feel patient would benefit from short course of formal physical therapy. No red flag symptoms noted upon exam today.     Meds & Orders:  Meds ordered this encounter  Medications   diazepam (VALIUM) 5 MG tablet    Sig: Take one tablet by mouth with food one hour prior to procedure. May repeat 30 minutes prior if needed.    Dispense:  2 tablet    Refill:  0   methocarbamol (ROBAXIN) 500 MG tablet    Sig: Take 1 tablet (500 mg total) by mouth 3 (three) times daily.    Dispense:  60 tablet    Refill:  0   traMADol (ULTRAM) 50 MG tablet    Sig: Take 1 tablet (50 mg total) by mouth every 8 (eight) hours as needed.  Dispense:  15 tablet    Refill:  0    Orders Placed This Encounter  Procedures   XR Lumbar Spine Complete   MR LUMBAR SPINE WO CONTRAST    Follow-up: Return for lumbar MRI review.   Procedures: No procedures performed      Clinical History: Narrative & Impression CLINICAL DATA:  Chronic low back pain   EXAM: MRI LUMBAR SPINE WITHOUT CONTRAST   TECHNIQUE: Multiplanar, multisequence MR imaging of the lumbar spine was performed. No intravenous contrast was administered.    COMPARISON:  Radiographs 01/01/2023   FINDINGS: Segmentation: The lowest lumbar type non-rib-bearing vertebra is labeled as L5.   Alignment:  3 mm grade 1 degenerative anterolisthesis at L4-5.   Vertebrae: Disc desiccation at L4-5 and L5-S1 with loss of disc height at L5-S1. Mild type 1 degenerative endplate findings posteriorly at L3-4 and L5-S1.   Conus medullaris and cauda equina: Conus extends to the mid to lower L2 level, mildly low in position. No tethering mass identified. Conus and cauda equina appear normal.   Paraspinal and other soft tissues: Fluid signal intensity lesion of the right kidney is most compatible with a simple cyst. No further imaging workup of this lesion is indicated.   Disc levels:   T12-L1: Unremarkable   L1-2: Unremarkable.   L2-3: Unremarkable   L3-4: Moderate left subarticular lateral recess stenosis due to a left lateral recess disc extrusion or disc fragment which extends caudad. Mild bilateral foraminal stenosis due to disc osteophyte complex and facet arthropathy.   L4-5: Mild bilateral foraminal stenosis due to disc uncovering, disc bulge, and facet arthropathy.   L5-S1: Moderate right and mild left foraminal stenosis due to disc osteophyte complex and facet arthropathy.   IMPRESSION: 1. Lumbar spondylosis and degenerative disc disease, causing moderate impingement at L3-4 and L4-5, mild impingement at L4-5. 2. Contributing to the impingement at L3-4, there is a left lateral recess disc extrusion or disc fragment extending caudad and impinging on the left L4 nerve roots. 3. 3 mm grade 1 degenerative anterolisthesis at L4-5.     Electronically Signed   By: Gaylyn Rong M.D.   On: 02/03/2023 14:34   He reports that he has never smoked. He has never used smokeless tobacco.  Recent Labs    05/14/22 1003 11/01/22 1027  HGBA1C 7.1 7.3*  LABURIC  --  4.2    Objective:  VS:  HT:    WT:   BMI:     BP:111/74  HR:78bpm   TEMP: ( )  RESP:  Physical Exam Vitals and nursing note reviewed.  HENT:     Head: Normocephalic and atraumatic.     Right Ear: External ear normal.     Left Ear: External ear normal.     Nose: Nose normal.     Mouth/Throat:     Mouth: Mucous membranes are moist.  Eyes:     Extraocular Movements: Extraocular movements intact.  Cardiovascular:     Rate and Rhythm: Normal rate.     Pulses: Normal pulses.  Pulmonary:     Effort: Pulmonary effort is normal.  Abdominal:     General: Abdomen is flat. There is no distension.  Musculoskeletal:        General: Tenderness present.     Cervical back: Normal range of motion.     Comments: Patient rises from seated position to standing without difficulty. Good lumbar range of motion. No pain noted with facet loading. 5/5 strength noted with bilateral hip  flexion, knee flexion/extension, ankle dorsiflexion/plantarflexion and EHL. No clonus noted bilaterally. No pain upon palpation of greater trochanters. No pain with internal/external rotation of bilateral hips. Sensation intact bilaterally. Negative slump test bilaterally. Ambulates without aid, gait steady.     Skin:    General: Skin is warm and dry.     Capillary Refill: Capillary refill takes less than 2 seconds.  Neurological:     General: No focal deficit present.     Mental Status: He is alert and oriented to person, place, and time.  Psychiatric:        Mood and Affect: Mood normal.        Behavior: Behavior normal.     Ortho Exam  Imaging: No results found.  Past Medical/Family/Surgical/Social History: Medications & Allergies reviewed per EMR, new medications updated. 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   Gout 12/15/2014   Routine health maintenance 07/28/2011   Severe obesity (BMI >= 40) (HCC) 06/21/2008   OSA (obstructive sleep apnea) 04/28/2007   Past Medical History:  Diagnosis Date   Allergic rhinitis    ALLERGIC RHINITIS    Allergy    Anxiety    Gastroenteritis    GERD (gastroesophageal reflux disease)    occasionally   Gout    Hemorrhoid    Kidney stone on right side 05/2004   OBESITY, CLASS II    OSA (obstructive sleep apnea)    CPAP qhs   Oxygen deficiency    PATELLAR DISLOCATION, RIGHT    Sleep apnea    wears cpap   Family History  Problem Relation Age of Onset   Diabetes Mother    Anemia Mother    Immunodeficiency Mother    Stroke Father    Dementia Father    Hypertension Father    Stomach cancer Maternal Grandmother    Colon cancer Neg Hx    Prostate cancer Neg Hx    Cancer Neg Hx    Heart disease Neg Hx    COPD Neg Hx    Colon polyps Neg Hx    Esophageal cancer Neg Hx    Rectal cancer Neg Hx    Past Surgical History:  Procedure Laterality Date   HEMORRHOID SURGERY     WISDOM TOOTH EXTRACTION     Social History   Occupational History   Occupation: Games developer in maintenance    Employer: [employer]  Tobacco Use   Smoking status: Never   Smokeless tobacco: Never  Vaping Use   Vaping status: Never Used  Substance and Sexual Activity   Alcohol use: No    Alcohol/week: 0.0 standard drinks of alcohol   Drug use: No   Sexual activity: Yes    Partners: Female

## 2023-01-01 NOTE — Progress Notes (Signed)
Functional Pain Scale - descriptive words and definitions  Uncomfortable (3)  Pain is present but can complete all ADL's/sleep is slightly affected and passive distraction only gives marginal relief. Mild range order  Average Pain 3  LBP, that is consistent and feels like an ache. Occasionally causes craps in legs depending on movement. Pain can get up to a 10 at times.

## 2023-01-02 MED ORDER — METHOCARBAMOL 500 MG PO TABS: 500.0000 mg | ORAL_TABLET | Freq: Three times a day (TID) | ORAL | 0 refills | Status: DC

## 2023-01-02 MED ORDER — TRAMADOL HCL 50 MG PO TABS: 50.0000 mg | ORAL_TABLET | Freq: Three times a day (TID) | ORAL | 0 refills | Status: DC | PRN

## 2023-01-08 ENCOUNTER — Encounter: Payer: Self-pay | Admitting: Internal Medicine

## 2023-01-09 NOTE — Progress Notes (Signed)
Subjective:    Patient ID: Randy Hendrix, male    DOB: 02-Jun-1965, 57 y.o.   MRN: [id number]      HPI Adrienne is here for  Chief Complaint  Patient presents with   Back Pain    Been going on for years now, it progress worse over time. Pain is around the lower back area, constant pain but worse with different movement. Pain scale is 3 normally, but when there is flare up is gradually increase or just straight 8 to 9      Back pain -  has chronic lower back pain with intermittent flares - started after being released from [employer] in 1992.  Just saw [facility], had x-ray done, started on methocarbamol and tramadol- MRI ordered for 12/6.    Daily chronic pain is 3/10.  Lately pain has been increasing - yesterday was an 8/10, today is a 5/10.  Certain movements cause tightening, increased stiffness in the lower back.  Sometimes N/T in posterior legs - can be just one leg.  Sometimes cramps in calves, occ shooting pain down legs.    He has a physically active job and often that aggravates his back.  Taking methocarbamol and tramadol - helps a little-often gets the pain back to a 3/10.      Medications and allergies reviewed with patient and updated if appropriate.  Current Outpatient Medications on File Prior to Visit  Medication Sig Dispense Refill   allopurinol (ZYLOPRIM) 100 MG tablet TAKE 1 TABLET(100 MG) BY MOUTH DAILY 90 tablet 3   azelastine (ASTELIN) 0.1 % nasal spray USE 2 SPRAYS IN EACH NOSTRIL TWICE DAILY AS NEEDED 30 mL 6   Cyanocobalamin (VITAMIN B 12 PO) Take 1 tablet by mouth daily.     diazepam (VALIUM) 5 MG tablet Take one tablet by mouth with food one hour prior to procedure. May repeat 30 minutes prior if needed. 2 tablet 0   dicyclomine (BENTYL) 10 MG capsule Take 1 capsule (10 mg total) by mouth 2 (two) times daily before a meal. 60 capsule 5   empagliflozin (JARDIANCE) 25 MG TABS tablet Take 1 tablet (25 mg total) by mouth daily. 90 tablet 3   fexofenadine  (ALLEGRA) 180 MG tablet Take 180 mg by mouth daily.     Garcinia Cambogia-Chromium 500-200 MG-MCG TABS Take 1 capsule by mouth daily.     glucose blood (ACCU-CHEK GUIDE) test strip TEST FOUR TIMES DAILY AS DIRECTED 100 strip 2   Green Tea, Camillia sinensis, (GREEN TEA PO) Take 1 capsule by mouth daily.     Lancets (ONETOUCH DELICA PLUS LANCET30G) MISC Use daily up to 4 times a day 400 each 3   losartan (COZAAR) 50 MG tablet Take 1 tablet (50 mg total) by mouth daily. 90 tablet 3   meloxicam (MOBIC) 15 MG tablet Take 1 tablet (15 mg total) by mouth daily. 90 tablet 3   methocarbamol (ROBAXIN) 500 MG tablet Take 1 tablet (500 mg total) by mouth 3 (three) times daily. 60 tablet 0   methylcellulose (CITRUCEL) oral powder Citrucel: use as directed once daily (Patient taking differently: Taking 1 dose as needed)     mometasone (NASONEX) 50 MCG/ACT nasal spray Place 2 sprays into the nose daily.  1   Multiple Vitamin (MULTIVITAMIN) capsule Take 1 capsule by mouth daily.     omeprazole (PRILOSEC) 20 MG capsule Take 1 capsule (20 mg total) by mouth daily. 90 capsule 3   SYSTANE ULTRA 0.4-0.3 % SOLN  SMARTSIG:1 Drop(s) In Eye(s) PRN     traMADol (ULTRAM) 50 MG tablet Take 1 tablet (50 mg total) by mouth every 8 (eight) hours as needed. 15 tablet 0   VITAMIN D PO Take by mouth.     gabapentin (NEURONTIN) 300 MG capsule TAKE 1 CAPSULE(300 MG) BY MOUTH AT BEDTIME (Patient not taking: Reported on 01/10/2023) 90 capsule 3   Semaglutide, 1 MG/DOSE, 4 MG/3ML SOPN Inject 1 mg as directed once a week. (Patient not taking: Reported on 01/10/2023) 3 mL 0   Semaglutide, 2 MG/DOSE, 8 MG/3ML SOPN Inject 2 mg as directed once a week. (Patient not taking: Reported on 01/10/2023) 3 mL 5   No current facility-administered medications on file prior to visit.    Review of Systems     Objective:   Vitals:   01/10/23 0852  BP: 124/76  Pulse: 78  Temp: 98.7 F (37.1 C)  SpO2: 95%   BP Readings from Last 3  Encounters:  01/10/23 124/76  01/01/23 111/74  11/12/22 (!) 144/77   Wt Readings from Last 3 Encounters:  01/10/23 (!) 322 lb 6 oz (146.2 kg)  11/01/22 (!) 325 lb (147.4 kg)  06/27/22 (!) 329 lb 12.8 oz (149.6 kg)   Body mass index is 46.26 kg/m.    Physical Exam Constitutional:      General: He is not in acute distress.    Appearance: Normal appearance. He is not ill-appearing.  HENT:     Head: Normocephalic and atraumatic.  Musculoskeletal:        General: Tenderness (Tenderness along lumbar spine, para spinal muscles in lumbar region and to lesser degree across lower back) present. No swelling or deformity.     Right lower leg: No edema.     Left lower leg: No edema.  Skin:    General: Skin is warm and dry.     Findings: No rash.  Neurological:     Mental Status: He is alert.     Sensory: No sensory deficit.     Motor: No weakness.  Psychiatric:        Mood and Affect: Mood normal.            Assessment & Plan:    Acute on chronic lower back pain: Acute flare on top of chronic back pain Has seen orthopedics and had an x-ray-possible grade 1 anterior listhesis of L4 on L5, biforaminal narrowing at L4-5, multilevel degenerative changes MRI ordered Currently taking tramadol 50 mg every 8 hours and methocarbamol 500 mg during the day-helps some, but pain still can reach an 8/10 at times Discussed options Note written for work-having a few days off will likely help calm his back down since he does have a physical job and is likely very aggravating for his back Trial of Flexeril 10 mg 3 times a day as needed-discussed he could just take this at night and take the methocarbamol during the day-cannot take both at the same time Prednisone 40 mg daily x 5 days-we both agree this is not ideal, but it is the 1 thing that will hopefully help calm his pain down-can stop early Discussed importance of weight loss Most likely Ortho will refer for PT at his follow-up  appointment

## 2023-01-10 ENCOUNTER — Encounter: Payer: Self-pay | Admitting: Internal Medicine

## 2023-01-10 ENCOUNTER — Ambulatory Visit (INDEPENDENT_AMBULATORY_CARE_PROVIDER_SITE_OTHER): Payer: 59 | Admitting: Internal Medicine

## 2023-01-10 VITALS — BP 124/76 | HR 78 | Temp 98.7°F | Ht 70.0 in | Wt 322.4 lb

## 2023-01-10 DIAGNOSIS — M545 Low back pain, unspecified: Secondary | ICD-10-CM

## 2023-01-10 MED ORDER — CYCLOBENZAPRINE HCL 10 MG PO TABS: 10.0000 mg | ORAL_TABLET | Freq: Three times a day (TID) | ORAL | 0 refills | Status: DC | PRN

## 2023-01-10 MED ORDER — PREDNISONE 20 MG PO TABS: 40.0000 mg | ORAL_TABLET | Freq: Every day | ORAL | 0 refills | Status: AC

## 2023-01-10 NOTE — Patient Instructions (Addendum)
         Medications changes include :   flexeril 10 mg three times a day as needed.  Prednisone 40 mg daily x 5 days     Note for work

## 2023-01-24 ENCOUNTER — Ambulatory Visit: Payer: 59 | Admitting: Behavioral Health

## 2023-01-24 ENCOUNTER — Encounter: Payer: Self-pay | Admitting: Behavioral Health

## 2023-01-24 ENCOUNTER — Ambulatory Visit
Admission: RE | Admit: 2023-01-24 | Discharge: 2023-01-24 | Disposition: A | Discharge status: Home / Self Care | Payer: 59 | Source: Ambulatory Visit | Attending: Physical Medicine and Rehabilitation | Admitting: Physical Medicine and Rehabilitation

## 2023-01-24 DIAGNOSIS — M5416 Radiculopathy, lumbar region: Secondary | ICD-10-CM

## 2023-01-24 DIAGNOSIS — R454 Irritability and anger: Secondary | ICD-10-CM

## 2023-01-24 DIAGNOSIS — F411 Generalized anxiety disorder: Secondary | ICD-10-CM | POA: Diagnosis not present

## 2023-01-24 DIAGNOSIS — G8929 Other chronic pain: Secondary | ICD-10-CM

## 2023-01-24 NOTE — Progress Notes (Signed)
[facility] Counselor/Therapist Progress Note  Patient ID: Jayvonn Ogarro, MRN: [id number],    Date: 01/24/2023  Time Spent: 55 minutes, 10AM until 10:55 AM.  This session was held via video teletherapy. The patient consented to the video teletherapy and was located in his car during this session. He is aware it is the responsibility of the patient to secure confidentiality on his end of the session. The provider was in a private home office for the duration of this session.      Treatment Type: Individual Therapy  Reported Symptoms: Anxiety/irritability  Mental Status Exam: Appearance:  Fairly Groomed     Behavior: Appropriate  Motor: Normal  Speech/Language:  Normal Rate  Affect: Appropriate  Mood: normal  Thought process: normal  Thought content:   WNL  Sensory/Perceptual disturbances:   WNL  Orientation: oriented to person, place, time/date, situation, day of week, month of year, and year  Attention: Good  Concentration: Good  Memory: WNL  Fund of knowledge:  Good  Insight:   Good  Judgment:  Good  Impulse Control: Good   Risk Assessment: Danger to Self:  No Self-injurious Behavior: No Danger to Others: No Duty to Warn:no Physical Aggression / Violence:No  Access to Firearms a concern: No  Gang Involvement:No   Subjective: The patient is tired.  He did meet with a PA with an orthopedic group who indicated that he needed to take some medication for his back and take some time away from work or they would be looking at surgery several months down the road.  He does not want that.  He took last week off and started on prednisone and pain medication reports that his back pain has about a 3 which is what he considers baseline.  He is getting an MRI today.  His daughter appears to be progressing and is going to physical therapy.  His wife still has some medical issues.  They have had a lot going on throughout the holidays and in preparation for Christmas.   He acknowledges the fact that his stress level has gone up significantly and he finds himself using coping skills more frequently and he is thankful that he has them.  We looked at some other comfort measures which might help him with his current stress level.  He says he feels he is doing a better job of pacing himself at work and the person hard to work with him for the most part is doing well.  He says life is teaching him that you have to find more balance and be more intentional about self-care which he is attempting to do.   He is using his coping skills.  We did reemphasize the importance of intentional mindfulness for self-care.  Interventions: Cognitive Behavioral Therapy and Dialectical Behavioral Therapy  Diagnosis: Generalized anxiety disorder, irritability  Plan: I will meet with the patient virtually every 2 to 3 weeks.  Reviewed treatment plan on November 1 session. Target date December 19, 2022.  Progress: 35% Goals will be to improve the patient's ability to manage anxiety and stress in a healthier way, identify causes for anxiety and explore ways to lower them, resolve any core conflicts contributing to anxiety and help the patient manage worrisome thoughts and thinking's contributing to stress and anxiety.  Interventions will include providing education about anxiety and stress to help him identify its causes, facilitate problem solution skills as well as teach coping skills to manage anxiety symptoms such as grounding exercises, progressive muscle relaxation  etc.  We will also use cognitive behavioral therapy to identify and change anxiety provoking thought and behavior patterns as well as use dialectical behavior therapy distress tolerance and mindfulness skills to help him learn better anxiety management skills.  I reviewed the goals for the patient and he would like to continue as goals stated above.  New target date is June 18, 2023. [place],  [facility]                [place], [facility]               [place], [facility]               [place], [facility]               [place], [facility]               [place], [facility]               [place] Ana, [facility]               French Ana, [facility]               [place] Ana, [facility]               [place], [facility]

## 2023-02-03 ENCOUNTER — Encounter: Payer: Self-pay | Admitting: Internal Medicine

## 2023-02-03 NOTE — Telephone Encounter (Signed)
 Care team updated and letter sent for eye exam notes.

## 2023-02-07 ENCOUNTER — Ambulatory Visit: Payer: 59 | Admitting: Physical Medicine and Rehabilitation

## 2023-02-07 DIAGNOSIS — G8929 Other chronic pain: Secondary | ICD-10-CM

## 2023-02-07 DIAGNOSIS — M5116 Intervertebral disc disorders with radiculopathy, lumbar region: Secondary | ICD-10-CM | POA: Diagnosis not present

## 2023-02-07 DIAGNOSIS — M5416 Radiculopathy, lumbar region: Secondary | ICD-10-CM

## 2023-02-07 DIAGNOSIS — M5442 Lumbago with sciatica, left side: Secondary | ICD-10-CM

## 2023-02-07 DIAGNOSIS — M5441 Lumbago with sciatica, right side: Secondary | ICD-10-CM

## 2023-02-07 MED ORDER — DIAZEPAM 5 MG PO TABS: ORAL_TABLET | ORAL | 0 refills | Status: DC

## 2023-02-07 NOTE — Progress Notes (Unsigned)
Randy Hendrix - 57 y.o. male MRN [id number]  Date of birth: 1965/07/05  Office Visit Note: Visit Date: 02/07/2023 PCP: Myrlene Broker, MD Referred by: Myrlene Broker, *  Subjective: Chief Complaint  Patient presents with   Lower Back - Pain   HPI: Randy Hendrix is a 57 y.o. male who comes in today for evaluation of chronic, worsening and severe bilateral lower back pain radiating to buttocks and down both legs, pain ongoing since being released from [employer] in 1992. His wife Clearance Coots has also been treated in our office. His pain worsens with movement and activity, describes as sore and aching, currently rates as 7 out of 10. Some relief of pain with home exercise regimen, rest and use of medications. He continues with Gabapentin. No history of formal physical therapy. Recent lumbar MRI imagine exhibits grade 1 degenerative anterolisthesis of L4 on L5. There is left lateral recess disc extrusion at L3-L4 causing moderate subarticular stenosis. No high grade spinal canal stenosis. He currently works full time for [employer]. Patient denies focal weakness, numbness and tingling. No recent trauma or falls.   Patients course is complicated by diabetes mellitus, obesity, anxiety and PTSD. Most recent A1C in September was 7.3.     Review of Systems  Musculoskeletal:  Positive for back pain.  Neurological:  Negative for tingling, sensory change, focal weakness and weakness.  All other systems reviewed and are negative.  Otherwise per HPI.  Assessment & Plan: Visit Diagnoses:    ICD-10-CM   1. Chronic bilateral low back pain with bilateral sciatica  M54.42 Ambulatory referral to Physical Medicine Rehab   M54.41    G89.29     2. Lumbar radiculopathy  M54.16 Ambulatory referral to Physical Medicine Rehab    3. Intervertebral disc disorders with radiculopathy, lumbar region  M51.16 Ambulatory referral to Physical Medicine Rehab       Plan: Findings:  Chronic,  worsening and severe bilateral lower back pain radiating to buttocks and down both legs. Patient continues to have severe pain despite good conservative therapies such as home exercise regimen, rest and use of medications. I discussed recent lumbar MRI with patient today using imaging and spine model. Patients clinical presentation and exam are consistent with lumbar radiculopathy, his pain does not follow specific dermatomal pattern. We discussed treatment plan in detail today, next step is to perform diagnostic and hopefully therapeutic L4-L5 interlaminar epidural steroid injection under fluoroscopic guidance. He is not currently taking anticoagulants. Patient voiced concerns with anxiety related to injections, I prescribed pre-procedure Valium for patient to take on day of procedure. I discussed injection procedure with patient today, he has no questions at this time. No red flag symptoms noted upon exam today.     Meds & Orders:  Meds ordered this encounter  Medications   diazepam (VALIUM) 5 MG tablet    Sig: Take one tablet by mouth with food one hour prior to procedure. May repeat 30 minutes prior if needed.    Dispense:  2 tablet    Refill:  0    Orders Placed This Encounter  Procedures   Ambulatory referral to Physical Medicine Rehab    Follow-up: Return for L4-L5 interlaminar epidural steroid injection.   Procedures: No procedures performed      Clinical History: Narrative & Impression CLINICAL DATA:  Chronic low back pain   EXAM: MRI LUMBAR SPINE WITHOUT CONTRAST   TECHNIQUE: Multiplanar, multisequence MR imaging of the lumbar spine was performed. No intravenous  contrast was administered.   COMPARISON:  Radiographs 01/01/2023   FINDINGS: Segmentation: The lowest lumbar type non-rib-bearing vertebra is labeled as L5.   Alignment:  3 mm grade 1 degenerative anterolisthesis at L4-5.   Vertebrae: Disc desiccation at L4-5 and L5-S1 with loss of disc height at L5-S1. Mild  type 1 degenerative endplate findings posteriorly at L3-4 and L5-S1.   Conus medullaris and cauda equina: Conus extends to the mid to lower L2 level, mildly low in position. No tethering mass identified. Conus and cauda equina appear normal.   Paraspinal and other soft tissues: Fluid signal intensity lesion of the right kidney is most compatible with a simple cyst. No further imaging workup of this lesion is indicated.   Disc levels:   T12-L1: Unremarkable   L1-2: Unremarkable.   L2-3: Unremarkable   L3-4: Moderate left subarticular lateral recess stenosis due to a left lateral recess disc extrusion or disc fragment which extends caudad. Mild bilateral foraminal stenosis due to disc osteophyte complex and facet arthropathy.   L4-5: Mild bilateral foraminal stenosis due to disc uncovering, disc bulge, and facet arthropathy.   L5-S1: Moderate right and mild left foraminal stenosis due to disc osteophyte complex and facet arthropathy.   IMPRESSION: 1. Lumbar spondylosis and degenerative disc disease, causing moderate impingement at L3-4 and L4-5, mild impingement at L4-5. 2. Contributing to the impingement at L3-4, there is a left lateral recess disc extrusion or disc fragment extending caudad and impinging on the left L4 nerve roots. 3. 3 mm grade 1 degenerative anterolisthesis at L4-5.     Electronically Signed   By: Gaylyn Rong M.D.   On: 02/03/2023 14:34   He reports that he has never smoked. He has never used smokeless tobacco.  Recent Labs    05/14/22 1003 11/01/22 1027  HGBA1C 7.1 7.3*  LABURIC  --  4.2    Objective:  VS:  HT:    WT:   BMI:     BP:   HR: bpm  TEMP: ( )  RESP:  Physical Exam Vitals and nursing note reviewed.  HENT:     Head: Normocephalic and atraumatic.     Right Ear: External ear normal.     Left Ear: External ear normal.     Nose: Nose normal.     Mouth/Throat:     Mouth: Mucous membranes are moist.  Eyes:      Extraocular Movements: Extraocular movements intact.  Cardiovascular:     Rate and Rhythm: Normal rate.     Pulses: Normal pulses.  Pulmonary:     Effort: Pulmonary effort is normal.  Abdominal:     General: Abdomen is flat. There is no distension.  Musculoskeletal:        General: Tenderness present.     Cervical back: Normal range of motion.     Comments: Patient rises from seated position to standing without difficulty. Good lumbar range of motion. No pain noted with facet loading. 5/5 strength noted with bilateral hip flexion, knee flexion/extension, ankle dorsiflexion/plantarflexion and EHL. No clonus noted bilaterally. No pain upon palpation of greater trochanters. No pain with internal/external rotation of bilateral hips. Sensation intact bilaterally. Negative slump test bilaterally. Ambulates without aid, gait steady.     Skin:    General: Skin is warm and dry.     Capillary Refill: Capillary refill takes less than 2 seconds.  Neurological:     General: No focal deficit present.     Mental Status: He is alert  and oriented to person, place, and time.  Psychiatric:        Mood and Affect: Mood normal.        Behavior: Behavior normal.     Ortho Exam  Imaging: No results found.  Past Medical/Family/Surgical/Social History: Medications & Allergies reviewed per EMR, new medications updated. 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   Gout 12/15/2014   Routine health maintenance 07/28/2011   Severe obesity (BMI >= 40) (HCC) 06/21/2008   OSA (obstructive sleep apnea) 04/28/2007   Past Medical History:  Diagnosis Date   Allergic rhinitis    ALLERGIC RHINITIS     Allergy    Anxiety    Gastroenteritis    GERD (gastroesophageal reflux disease)    occasionally   Gout    Hemorrhoid    Kidney stone on right side 05/2004   OBESITY, CLASS II    OSA (obstructive sleep apnea)    CPAP qhs   Oxygen deficiency    PATELLAR DISLOCATION, RIGHT    Sleep apnea    wears cpap   Family History  Problem Relation Age of Onset   Diabetes Mother    Anemia Mother    Immunodeficiency Mother    Stroke Father    Dementia Father    Hypertension Father    Stomach cancer Maternal Grandmother    Colon cancer Neg Hx    Prostate cancer Neg Hx    Cancer Neg Hx    Heart disease Neg Hx    COPD Neg Hx    Colon polyps Neg Hx    Esophageal cancer Neg Hx    Rectal cancer Neg Hx    Past Surgical History:  Procedure Laterality Date   HEMORRHOID SURGERY     WISDOM TOOTH EXTRACTION     Social History   Occupational History   Occupation: Games developer in maintenance    Employer: [employer]  Tobacco Use   Smoking status: Never   Smokeless tobacco: Never  Vaping Use   Vaping status: Never Used  Substance and Sexual Activity   Alcohol use: No    Alcohol/week: 0.0 standard drinks of alcohol   Drug use: No   Sexual activity: Yes    Partners: Female

## 2023-02-09 ENCOUNTER — Encounter: Payer: Self-pay | Admitting: Physical Medicine and Rehabilitation

## 2023-03-04 ENCOUNTER — Ambulatory Visit (INDEPENDENT_AMBULATORY_CARE_PROVIDER_SITE_OTHER): Payer: 59 | Admitting: Podiatry

## 2023-03-04 ENCOUNTER — Encounter: Payer: Self-pay | Admitting: Podiatry

## 2023-03-04 DIAGNOSIS — L84 Corns and callosities: Secondary | ICD-10-CM

## 2023-03-04 DIAGNOSIS — M79675 Pain in left toe(s): Secondary | ICD-10-CM

## 2023-03-04 DIAGNOSIS — B351 Tinea unguium: Secondary | ICD-10-CM | POA: Diagnosis not present

## 2023-03-04 DIAGNOSIS — B353 Tinea pedis: Secondary | ICD-10-CM

## 2023-03-04 DIAGNOSIS — E119 Type 2 diabetes mellitus without complications: Secondary | ICD-10-CM

## 2023-03-04 DIAGNOSIS — M79674 Pain in right toe(s): Secondary | ICD-10-CM

## 2023-03-06 ENCOUNTER — Ambulatory Visit: Payer: 59 | Admitting: Physical Medicine and Rehabilitation

## 2023-03-06 ENCOUNTER — Ambulatory Visit: Payer: 59 | Admitting: Behavioral Health

## 2023-03-06 ENCOUNTER — Other Ambulatory Visit: Payer: Self-pay

## 2023-03-06 ENCOUNTER — Encounter: Payer: Self-pay | Admitting: Behavioral Health

## 2023-03-06 DIAGNOSIS — F411 Generalized anxiety disorder: Secondary | ICD-10-CM

## 2023-03-06 DIAGNOSIS — M5416 Radiculopathy, lumbar region: Secondary | ICD-10-CM

## 2023-03-06 DIAGNOSIS — R454 Irritability and anger: Secondary | ICD-10-CM | POA: Diagnosis not present

## 2023-03-06 MED ORDER — METHYLPREDNISOLONE ACETATE 40 MG/ML IJ SUSP
40.0000 mg | Freq: Once | INTRAMUSCULAR | Status: AC
  Administered 2023-03-06: 40 mg

## 2023-03-06 NOTE — Progress Notes (Signed)
[facility] Counselor/Therapist Progress Note  Patient ID: Vicent Daniel, MRN: [id number],    Date: 03/06/2023  Time Spent: 55 minutes, 10AM until 10:55 AM.  This session was held via video teletherapy. The patient consented to the video teletherapy and was located in his car during this session. He is aware it is the responsibility of the patient to secure confidentiality on his end of the session. The provider was in a private home office for the duration of this session.      Treatment Type: Individual Therapy  Reported Symptoms: Anxiety/irritability  Mental Status Exam: Appearance:  Fairly Groomed     Behavior: Appropriate  Motor: Normal  Speech/Language:  Normal Rate  Affect: Appropriate  Mood: normal  Thought process: normal  Thought content:   WNL  Sensory/Perceptual disturbances:   WNL  Orientation: oriented to person, place, time/date, situation, day of week, month of year, and year  Attention: Good  Concentration: Good  Memory: WNL  Fund of knowledge:  Good  Insight:   Good  Judgment:  Good  Impulse Control: Good   Risk Assessment: Danger to Self:  No Self-injurious Behavior: No Danger to Others: No Duty to Warn:no Physical Aggression / Violence:No  Access to Firearms a concern: No  Gang Involvement:No   Subjective: For the most part the patient's work is going well but it is still relatively stressful.  He says that he is becoming more frustrated by some of the extracurricular conversations and it things that he says people are saying that he knows are not appropriate.  His manager has had that some meetings about those type of things so the patient is working really hard to set verbal emotional etc. boundaries around language that he hears although he addresses it all around political conversations that he does not want to be a part of.  He says he just does not talk politics because he knows how controversy all it is and does not want the  conflict.  His daughter seems to be improving from her surgery a couple of months ago but he still tries to help out especially on his days off because his wife is there with her most of the time.  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  He said she goes back and forth between she does not want to be a burden which she does not consider her to be versus she feels overwhelmed with what she is doing at home and added she would benefit from him helping some with the paperwork, i.e. bills.  We talked about how he balances that and what he does and finding some time for himself to rest and recuperate physically.  He is getting steroid injections afternoon in his back with hopes that will relieve some of the back pain.  Also encouraged him to try to clarify some of what he feels are confusing messages from his wife and we talked about how men and women communicate and process and work differently at times. He is using his coping skills.  We did reemphasize the importance of intentional mindfulness for self-care.  Interventions: Cognitive Behavioral Therapy and Dialectical Behavioral Therapy  Diagnosis: Generalized anxiety disorder, irritability  Plan: I will meet with the patient virtually every 2 to 3 weeks.  Reviewed treatment plan on November 1 session. Target date December 19, 2022.  Progress: 35% Goals will be  to improve the patient's ability to manage anxiety and stress in a healthier way, identify causes for anxiety and explore ways to lower them, resolve any core conflicts contributing to anxiety and help the patient manage worrisome thoughts and thinking's contributing to stress and anxiety.  Interventions will include providing education about anxiety and stress to help him identify its causes, facilitate problem solution skills as well as teach coping skills to manage anxiety symptoms such as  grounding exercises, progressive muscle relaxation etc.  We will also use cognitive behavioral therapy to identify and change anxiety provoking thought and behavior patterns as well as use dialectical behavior therapy distress tolerance and mindfulness skills to help him learn better anxiety management skills.  I reviewed the goals for the patient and he would like to continue as goals stated above.  New target date is June 18, 2023. [place], [facility]                [place], [facility]               [place], [facility]               [place], [facility]               [place], [facility]               [place], [facility]               [place], [facility]               [place], [facility]               [place], [facility]               [place], [facility]               [place], [facility]

## 2023-03-06 NOTE — Progress Notes (Signed)
Functional Pain Scale - descriptive words and definitions  Moderate (4)   Constantly aware of pain, can complete ADLs with modification/sleep marginally affected at times/passive distraction is of no use, but active distraction gives some relief. Moderate range order  Average Pain 3  128/74 +Driver, -BT, -Dye Allergies.

## 2023-03-06 NOTE — Patient Instructions (Signed)

## 2023-03-10 NOTE — Progress Notes (Signed)
  Subjective:  Patient ID: Randy Hendrix, male    DOB: 08-Nov-1965,  MRN: [id number]  Zakari Earl Mabey presents to clinic today for preventative diabetic foot care and callus(es) left foot and painful thick toenails that are difficult to trim. Painful toenails interfere with ambulation. Aggravating factors include wearing enclosed shoe gear. Pain is relieved with periodic professional debridement. Painful calluses are aggravated when weightbearing with and without shoegear. Pain is relieved with periodic professional debridement.  Chief Complaint  Patient presents with   Nail Problem    RFC patient states he last saw his PCP in November and his know who is PCP is    New problem(s): None.   PCP is Rollene Almarie LABOR, MD.  No Known Allergies  Review of Systems: Negative except as noted in the HPI.  Objective: No changes noted in today's physical examination. There were no vitals filed for this visit. Kenaz Chanze Teagle is a pleasant 58 y.o. male morbidly obese in NAD. AAO x 3.  Vascular Examination: Capillary refill time immediate b/l. Vascular status intact b/l with palpable pedal pulses. Pedal hair present b/l. No pain with calf compression b/l. Skin temperature gradient WNL b/l. No cyanosis or clubbing b/l. No ischemia or gangrene noted b/l.   Neurological Examination: Sensation grossly intact b/l with 10 gram monofilament. Vibratory sensation intact b/l.   Dermatological Examination: Pedal skin with normal turgor, texture and tone b/l.  No open wounds.  Toenails 1-5 b/l thick, discolored, elongated with subungual debris and pain on dorsal palpation.   Interdigital maceration noted bilateral 2nd, 3rd and right 4th webspace(s). No blistering, no weeping, no open wounds. Hyperkeratotic lesion(s) submet head 5 left foot.  No erythema, no edema, no drainage, no fluctuance.  Musculoskeletal Examination: Muscle strength 5/5 to all lower extremity muscle groups  bilaterally. Pes planus deformity noted bilateral LE.  Radiographs: None  Last A1c:      Latest Ref Rng & Units 11/01/2022   10:27 AM 05/14/2022   10:03 AM  Hemoglobin A1C  Hemoglobin-A1c 4.6 - 6.5 % 7.3  7.1    Assessment/Plan: 1. Pain due to onychomycosis of toenails of both feet   2. Callus   3. Tinea pedis of both feet   4. Type 2 diabetes mellitus without complication, without long-term current use of insulin  [facility])    -Patient was evaluated today. All questions/concerns addressed on today's visit. -Continue foot and shoe inspections daily. Monitor blood glucose per PCP/Endocrinologist's recommendations. -Continue supportive shoe gear daily. -Mycotic toenails 1-5 bilaterally were debrided in length and girth with sterile nail nippers and dremel without incident. -Patient instructed to spray Lotrimin Antifungal Spray Powder between toes daily. He related understnading. -Patient/POA to call should there be question/concern in the interim.   Return in about 3 months (around 06/02/2023).  Delon LITTIE Merlin, DPM      [facility] LOCATION: [address], [place] [address]                   Office [phone]   [facility] LOCATION: [address] [place], [place] [address] Office [phone]

## 2023-03-12 NOTE — Procedures (Signed)
Lumbar Epidural Steroid Injection - Interlaminar Approach with Fluoroscopic Guidance  Patient: Randy Hendrix      Date of Birth: 1965-04-23 MRN: [id number] PCP: Myrlene Broker, MD      Visit Date: 03/06/2023   Universal Protocol:     Consent Given By: the patient  Position: PRONE  Additional Comments: Vital signs were monitored before and after the procedure. Patient was prepped and draped in the usual sterile fashion. The correct patient, procedure, and site was verified.   Injection Procedure Details:   Procedure diagnoses: Lumbar radiculopathy [M54.16]   Meds Administered:  Meds ordered this encounter  Medications   methylPREDNISolone acetate (DEPO-MEDROL) injection 40 mg     Laterality: Right  Location/Site:  L4-5  Needle: 4.5 in., 20 ga. Tuohy  Needle Placement: Paramedian epidural  Findings:   -Comments: Excellent flow of contrast into the epidural space.  Procedure Details: Using a paramedian approach from the side mentioned above, the region overlying the inferior lamina was localized under fluoroscopic visualization and the soft tissues overlying this structure were infiltrated with 4 ml. of 1% Lidocaine without Epinephrine. The Tuohy needle was inserted into the epidural space using a paramedian approach.   The epidural space was localized using loss of resistance along with counter oblique bi-planar fluoroscopic views.  After negative aspirate for air, blood, and CSF, a 2 ml. volume of Isovue-250 was injected into the epidural space and the flow of contrast was observed. Radiographs were obtained for documentation purposes.    The injectate was administered into the level noted above.   Additional Comments:  No complications occurred Dressing: 2 x 2 sterile gauze and Band-Aid    Post-procedure details: Patient was observed during the procedure. Post-procedure instructions were reviewed.  Patient left the clinic in stable condition.

## 2023-03-12 NOTE — Progress Notes (Signed)
Randy Hendrix - 58 y.o. male MRN [id number]  Date of birth: 10-23-1965  Office Visit Note: Visit Date: 03/06/2023 PCP: Myrlene Broker, MD Referred by: Myrlene Broker, *  Subjective: Chief Complaint  Patient presents with   Lower Back - Pain   HPI:  Randy Hendrix is a 58 y.o. male who comes in today at the request of Ellin Goodie, FNP for planned Right L4-5 Lumbar Interlaminar epidural steroid injection with fluoroscopic guidance.  The patient has failed conservative care including home exercise, medications, time and activity modification.  This injection will be diagnostic and hopefully therapeutic.  Please see requesting physician notes for further details and justification.   ROS Otherwise per HPI.  Assessment & Plan: Visit Diagnoses:    ICD-10-CM   1. Lumbar radiculopathy  M54.16 XR C-ARM NO REPORT    Epidural Steroid injection    methylPREDNISolone acetate (DEPO-MEDROL) injection 40 mg      Plan: No additional findings.   Meds & Orders:  Meds ordered this encounter  Medications   methylPREDNISolone acetate (DEPO-MEDROL) injection 40 mg    Orders Placed This Encounter  Procedures   XR C-ARM NO REPORT   Epidural Steroid injection    Follow-up: Return for visit to requesting provider as needed.   Procedures: No procedures performed  Lumbar Epidural Steroid Injection - Interlaminar Approach with Fluoroscopic Guidance  Patient: Randy Hendrix      Date of Birth: [birth year] MRN: [id number] PCP: Myrlene Broker, MD      Visit Date: 03/06/2023   Universal Protocol:     Consent Given By: the patient  Position: PRONE  Additional Comments: Vital signs were monitored before and after the procedure. Patient was prepped and draped in the usual sterile fashion. The correct patient, procedure, and site was verified.   Injection Procedure Details:   Procedure diagnoses: Lumbar radiculopathy [M54.16]   Meds Administered:   Meds ordered this encounter  Medications   methylPREDNISolone acetate (DEPO-MEDROL) injection 40 mg     Laterality: Right  Location/Site:  L4-5  Needle: 4.5 in., 20 ga. Tuohy  Needle Placement: Paramedian epidural  Findings:   -Comments: Excellent flow of contrast into the epidural space.  Procedure Details: Using a paramedian approach from the side mentioned above, the region overlying the inferior lamina was localized under fluoroscopic visualization and the soft tissues overlying this structure were infiltrated with 4 ml. of 1% Lidocaine without Epinephrine. The Tuohy needle was inserted into the epidural space using a paramedian approach.   The epidural space was localized using loss of resistance along with counter oblique bi-planar fluoroscopic views.  After negative aspirate for air, blood, and CSF, a 2 ml. volume of Isovue-250 was injected into the epidural space and the flow of contrast was observed. Radiographs were obtained for documentation purposes.    The injectate was administered into the level noted above.   Additional Comments:  No complications occurred Dressing: 2 x 2 sterile gauze and Band-Aid    Post-procedure details: Patient was observed during the procedure. Post-procedure instructions were reviewed.  Patient left the clinic in stable condition.   Clinical History: Narrative & Impression CLINICAL DATA:  Chronic low back pain   EXAM: MRI LUMBAR SPINE WITHOUT CONTRAST   TECHNIQUE: Multiplanar, multisequence MR imaging of the lumbar spine was performed. No intravenous contrast was administered.   COMPARISON:  Radiographs 01/01/2023   FINDINGS: Segmentation: The lowest lumbar type non-rib-bearing vertebra is labeled as L5.   Alignment:  3  mm grade 1 degenerative anterolisthesis at L4-5.   Vertebrae: Disc desiccation at L4-5 and L5-S1 with loss of disc height at L5-S1. Mild type 1 degenerative endplate findings posteriorly at L3-4 and  L5-S1.   Conus medullaris and cauda equina: Conus extends to the mid to lower L2 level, mildly low in position. No tethering mass identified. Conus and cauda equina appear normal.   Paraspinal and other soft tissues: Fluid signal intensity lesion of the right kidney is most compatible with a simple cyst. No further imaging workup of this lesion is indicated.   Disc levels:   T12-L1: Unremarkable   L1-2: Unremarkable.   L2-3: Unremarkable   L3-4: Moderate left subarticular lateral recess stenosis due to a left lateral recess disc extrusion or disc fragment which extends caudad. Mild bilateral foraminal stenosis due to disc osteophyte complex and facet arthropathy.   L4-5: Mild bilateral foraminal stenosis due to disc uncovering, disc bulge, and facet arthropathy.   L5-S1: Moderate right and mild left foraminal stenosis due to disc osteophyte complex and facet arthropathy.   IMPRESSION: 1. Lumbar spondylosis and degenerative disc disease, causing moderate impingement at L3-4 and L4-5, mild impingement at L4-5. 2. Contributing to the impingement at L3-4, there is a left lateral recess disc extrusion or disc fragment extending caudad and impinging on the left L4 nerve roots. 3. 3 mm grade 1 degenerative anterolisthesis at L4-5.     Electronically Signed   By: Gaylyn Rong M.D.   On: 02/03/2023 14:34     Objective:  VS:  HT:    WT:   BMI:     BP:   HR: bpm  TEMP: ( )  RESP:  Physical Exam   Imaging: No results found.

## 2023-03-20 ENCOUNTER — Ambulatory Visit: Payer: 59 | Admitting: Behavioral Health

## 2023-03-20 ENCOUNTER — Encounter: Payer: Self-pay | Admitting: Behavioral Health

## 2023-03-20 DIAGNOSIS — F411 Generalized anxiety disorder: Secondary | ICD-10-CM | POA: Diagnosis not present

## 2023-03-20 DIAGNOSIS — R454 Irritability and anger: Secondary | ICD-10-CM

## 2023-03-20 NOTE — Progress Notes (Signed)
[facility] Counselor/Therapist Progress Note  Patient ID: Randy Hendrix, MRN: [id number],    Date: 03/20/2023  Time Spent: 55 minutes, 11:03 AM until 11:55 AM this session was held via video teletherapy. The patient consented to the video teletherapy and was located in his car during this session. He is aware it is the responsibility of the patient to secure confidentiality on his end of the session. The provider was in a private home office for the duration of this session.      Treatment Type: Individual Therapy  Reported Symptoms: Anxiety/irritability  Mental Status Exam: Appearance:  Fairly Groomed     Behavior: Appropriate  Motor: Normal  Speech/Language:  Normal Rate  Affect: Appropriate  Mood: normal  Thought process: normal  Thought content:   WNL  Sensory/Perceptual disturbances:   WNL  Orientation: oriented to person, place, time/date, situation, day of week, month of year, and year  Attention: Good  Concentration: Good  Memory: WNL  Fund of knowledge:  Good  Insight:   Good  Judgment:  Good  Impulse Control: Good   Risk Assessment: Danger to Self:  No Self-injurious Behavior: No Danger to Others: No Duty to Warn:no Physical Aggression / Violence:No  Access to Firearms a concern: No  Gang Involvement:No   Subjective: The patient has had a lot of significant stressors.  One of his coworkers was let go so now he is picking up additional work and will have to work Saturdays and  over time.  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  They are trying to figure out how to manage that time wise.  There have been some additional medical appointments for he and his family members so he is balancing those as much as he can on his days off.  Because of multiple doctors visits there are some additional bills that he and his wife are trying to sort through.  They plan to do that  intentionally today.  He is trying to help out more with his daughter's care on his days off.  We talked about how to try to include extended family members into her care as needed.  He does feel that he and his wife are doing a better job of intentional communication with all that they have going on and he is appreciative of that.  He feels that he is controlling what he can.  He said that his best self-care is intentionally setting aside time in his [place] to be able to problem solve and think through the best way to handle everything so we focused on the importance of mindfulness and the use of coping skills so that he is better able to logically go through the things that he has to deal with currently.  We also talked about the importance of compartmentalizing that which he can do versus what he cannot control.  He is using his coping skills.  We did reemphasize the importance of intentional mindfulness for self-care.  Interventions: Cognitive Behavioral Therapy and Dialectical Behavioral Therapy  Diagnosis: Generalized anxiety disorder, irritability  Plan: I will meet with the patient virtually every 2 to 3 weeks.  Reviewed treatment plan on November 1 session. Target date December 19, 2022.  Progress: 35% Goals will be to improve the patient's ability to manage anxiety and stress in a healthier way, identify causes for anxiety and explore ways to lower them, resolve any core conflicts contributing  to anxiety and help the patient manage worrisome thoughts and thinking's contributing to stress and anxiety.  Interventions will include providing education about anxiety and stress to help him identify its causes, facilitate problem solution skills as well as teach coping skills to manage anxiety symptoms such as grounding exercises, progressive muscle relaxation etc.  We will also use cognitive behavioral therapy to identify and change anxiety provoking thought and behavior patterns as well as use  dialectical behavior therapy distress tolerance and mindfulness skills to help him learn better anxiety management skills.  I reviewed the goals for the patient and he would like to continue as goals stated above.  New target date is June 18, 2023. [place], [facility]                French [place], [facility]               French [place], [facility]               French Ana, [facility]               French Ana, [facility]               French Ana, [facility]               French Ana, [facility]               French Ana, [facility]               [place], [facility]               French Ana, [facility]               French [place], [facility]               [place], [facility]

## 2023-04-04 ENCOUNTER — Ambulatory Visit: Payer: 59 | Admitting: Internal Medicine

## 2023-04-04 ENCOUNTER — Encounter: Payer: Self-pay | Admitting: Internal Medicine

## 2023-04-04 VITALS — BP 124/80 | HR 91 | Temp 98.4°F | Ht 70.0 in | Wt 308.0 lb

## 2023-04-04 DIAGNOSIS — M1A9XX0 Chronic gout, unspecified, without tophus (tophi): Secondary | ICD-10-CM

## 2023-04-04 DIAGNOSIS — J0110 Acute frontal sinusitis, unspecified: Secondary | ICD-10-CM | POA: Diagnosis not present

## 2023-04-04 DIAGNOSIS — J019 Acute sinusitis, unspecified: Secondary | ICD-10-CM | POA: Insufficient documentation

## 2023-04-04 DIAGNOSIS — E1169 Type 2 diabetes mellitus with other specified complication: Secondary | ICD-10-CM

## 2023-04-04 DIAGNOSIS — R350 Frequency of micturition: Secondary | ICD-10-CM

## 2023-04-04 MED ORDER — AMOXICILLIN-POT CLAVULANATE 875-125 MG PO TABS: 1.0000 | ORAL_TABLET | Freq: Two times a day (BID) | ORAL | 0 refills | Status: AC

## 2023-04-04 NOTE — Patient Instructions (Signed)
We have sent in augmentin to take 1 pill twice a day for 1 week.

## 2023-04-04 NOTE — Progress Notes (Signed)
   Subjective:   Patient ID: Randy Hendrix, male    DOB: 1965-05-26, 58 y.o.   MRN: [id number]  Cough Associated symptoms include ear pain, myalgias, postnasal drip, rhinorrhea and shortness of breath. Pertinent negatives include no chills, fever, sore throat or wheezing.   The patient is a 57 YO man coming in for 3 weeks of sinus symptoms and cough with SOB. Overall not improving but not clearly worsening. Cough intermittently productive.   Review of Systems  Constitutional:  Positive for activity change. Negative for appetite change, chills, fatigue, fever and unexpected weight change.  HENT:  Positive for congestion, ear pain, postnasal drip, rhinorrhea, sinus pressure and sinus pain. Negative for ear discharge, sneezing, sore throat, tinnitus, trouble swallowing and voice change.   Eyes: Negative.   Respiratory:  Positive for cough and shortness of breath. Negative for chest tightness and wheezing.   Cardiovascular: Negative.   Gastrointestinal: Negative.   Musculoskeletal:  Positive for myalgias.  Neurological: Negative.     Objective:  Physical Exam Constitutional:      Appearance: He is well-developed.  HENT:     Head: Normocephalic and atraumatic.     Comments: Oropharynx with redness and clear drainage, nose with swollen turbinates, TMs normal bilaterally.  Neck:     Thyroid: No thyromegaly.  Cardiovascular:     Rate and Rhythm: Normal rate and regular rhythm.  Pulmonary:     Effort: Pulmonary effort is normal. No respiratory distress.     Breath sounds: Normal breath sounds. No wheezing or rales.  Abdominal:     General: Bowel sounds are normal. There is no distension.     Palpations: Abdomen is soft.     Tenderness: There is no abdominal tenderness. There is no rebound.  Musculoskeletal:        General: No tenderness.     Cervical back: Normal range of motion.  Lymphadenopathy:     Cervical: No cervical adenopathy.  Skin:    General: Skin is warm and dry.   Neurological:     Mental Status: He is alert and oriented to person, place, and time.     Coordination: Coordination normal.     Vitals:   04/04/23 1053  BP: 124/80  Pulse: 91  Temp: 98.4 F (36.9 C)  TempSrc: Oral  SpO2: 98%  Weight: (!) 308 lb (139.7 kg)  Height: 5\' 10"  (1.778 m)    Assessment & Plan:

## 2023-04-04 NOTE — Assessment & Plan Note (Signed)
Symptoms 3 weeks without improvement. Rx augmetin 1 week course. Continue allegra and nasonex daily.

## 2023-04-10 ENCOUNTER — Ambulatory Visit: Payer: 59 | Admitting: Behavioral Health

## 2023-04-10 ENCOUNTER — Encounter: Payer: Self-pay | Admitting: Behavioral Health

## 2023-04-10 DIAGNOSIS — R454 Irritability and anger: Secondary | ICD-10-CM

## 2023-04-10 DIAGNOSIS — F411 Generalized anxiety disorder: Secondary | ICD-10-CM
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
## 2023-04-14 ENCOUNTER — Encounter: Payer: Self-pay | Admitting: Internal Medicine

## 2023-04-14 ENCOUNTER — Other Ambulatory Visit (INDEPENDENT_AMBULATORY_CARE_PROVIDER_SITE_OTHER): Payer: 59

## 2023-04-14 DIAGNOSIS — M1A9XX0 Chronic gout, unspecified, without tophus (tophi): Secondary | ICD-10-CM

## 2023-04-14 DIAGNOSIS — E1169 Type 2 diabetes mellitus with other specified complication: Secondary | ICD-10-CM | POA: Diagnosis not present

## 2023-04-14 DIAGNOSIS — R350 Frequency of micturition: Secondary | ICD-10-CM | POA: Diagnosis not present

## 2023-04-14 LAB — COMPREHENSIVE METABOLIC PANEL
ALT: 30 U/L (ref 0–53)
AST: 20 U/L (ref 0–37)
Albumin: 4.2 g/dL (ref 3.5–5.2)
Alkaline Phosphatase: 90 U/L (ref 39–117)
BUN: 16 mg/dL (ref 6–23)
CO2: 23 meq/L (ref 19–32)
Calcium: 9.2 mg/dL (ref 8.4–10.5)
Chloride: 98 meq/L (ref 96–112)
Creatinine, Ser: 1 mg/dL (ref 0.40–1.50)
GFR: 82.98 mL/min (ref >60.00)
Glucose, Bld: 414 mg/dL — ABNORMAL HIGH (ref 70–99)
Potassium: 4.4 meq/L (ref 3.5–5.1)
Sodium: 135 meq/L (ref 135–145)
Total Bilirubin: 1.4 mg/dL — ABNORMAL HIGH (ref 0.2–1.2)
Total Protein: 6.5 g/dL (ref 6.0–8.3)

## 2023-04-14 LAB — URINALYSIS, ROUTINE W REFLEX MICROSCOPIC
Bilirubin Urine: NEGATIVE
Hgb urine dipstick: NEGATIVE
Ketones, ur: >=80 — AB
Leukocytes,Ua: NEGATIVE
Nitrite: NEGATIVE
RBC / HPF: NONE SEEN (ref 0–?)
Specific Gravity, Urine: 1.02 (ref 1.000–1.030)
Total Protein, Urine: NEGATIVE
Urine Glucose: >=1000 — AB
Urobilinogen, UA: 0.2 (ref 0.0–1.0)
WBC, UA: NONE SEEN (ref 0–?)
pH: 6 (ref 5.0–8.0)

## 2023-04-14 LAB — CBC
HCT: 36.3 % — ABNORMAL LOW (ref 39.0–52.0)
Hemoglobin: 11.5 g/dL — ABNORMAL LOW (ref 13.0–17.0)
MCHC: 31.8 g/dL (ref 30.0–36.0)
MCV: 69.3 fl — ABNORMAL LOW (ref 78.0–100.0)
Platelets: 211 10*3/uL (ref 150.0–400.0)
RBC: 5.24 Mil/uL (ref 4.22–5.81)
RDW: 17.9 % — ABNORMAL HIGH (ref 11.5–15.5)
WBC: 8.4 10*3/uL (ref 4.0–10.5)

## 2023-04-14 LAB — URIC ACID: Uric Acid, Serum: 5.6 mg/dL (ref 4.0–7.8)

## 2023-04-14 LAB — PSA: PSA: 0.44 ng/mL (ref 0.10–4.00)

## 2023-04-14 LAB — HEMOGLOBIN A1C: Hgb A1c MFr Bld: 12.4 % — ABNORMAL HIGH (ref 4.6–6.5)

## 2023-04-17 ENCOUNTER — Other Ambulatory Visit: Payer: Self-pay | Admitting: Internal Medicine

## 2023-04-17 MED ORDER — METFORMIN HCL 1000 MG PO TABS: 1000.0000 mg | ORAL_TABLET | Freq: Two times a day (BID) | ORAL | 3 refills | Status: DC

## 2023-04-28 NOTE — Progress Notes (Signed)
 I have informed patient regarding this

## 2023-04-29 ENCOUNTER — Encounter: Payer: Self-pay | Admitting: Internal Medicine

## 2023-05-02 ENCOUNTER — Ambulatory Visit: Payer: 59 | Admitting: Behavioral Health

## 2023-05-02 ENCOUNTER — Encounter: Payer: Self-pay | Admitting: Behavioral Health

## 2023-05-02 DIAGNOSIS — R454 Irritability and anger: Secondary | ICD-10-CM | POA: Diagnosis not present

## 2023-05-02 DIAGNOSIS — F411 Generalized anxiety disorder: Secondary | ICD-10-CM | POA: Diagnosis not present

## 2023-05-02 DIAGNOSIS — F331 Major depressive disorder, recurrent, moderate: Secondary | ICD-10-CM

## 2023-05-02 NOTE — Progress Notes (Signed)
 [facility] Counselor/Therapist Progress Note  Patient ID: Qais Jowers, MRN: [id number],    Date: 05/02/2023  Time Spent: 55 minutes, 11:01 AM until 11:56 AM this session was held via video teletherapy. The patient consented to the video teletherapy and was located in his car during this session. He is aware it is the responsibility of the patient to secure confidentiality on his end of the session. The provider was in a private home office for the duration of this session.      Treatment Type: Individual Therapy  Reported Symptoms: Anxiety/irritability  Mental Status Exam: Appearance:  Fairly Groomed     Behavior: Appropriate  Motor: Normal  Speech/Language:  Normal Rate  Affect: Appropriate  Mood: normal  Thought process: normal  Thought content:   WNL  Sensory/Perceptual disturbances:   WNL  Orientation: oriented to person, place, time/date, situation, day of week, month of year, and year  Attention: Good  Concentration: Good  Memory: WNL  Fund of knowledge:  Good  Insight:   Good  Judgment:  Good  Impulse Control: Good   Risk Assessment: Danger to Self:  No Self-injurious Behavior: No Danger to Others: No Duty to Warn:no Physical Aggression / Violence:No  Access to Firearms a concern: No  Gang Involvement:No   Subjective: The patient is finally starting to feel better.  Most of bronchial issues he was having have settled down but now there are some allergy issues.  He had some difficult responses to the allergy medicine saying every time he tried to swallow medicine or something he could not keep it down.  As soon as he stopped the meds that improved.  He has had some back issues which are feeling better to when he is trying to be more aware of that care.  His wife has an upcoming surgery on April 5 so he is trying to do as much as he can before then but also keeping his health in a good place so he can help her.  She will be incapacitated  because of foot surgery for a lengthy amount of time.  He does feel good about the fact that he has lost about 35 or 40 pounds.  He has taken Ozempic but also says he lost some appetite because of all the respiratory and other illness stuff he went through over the winter.  He is eating and still eating well but feels much better saying it is nice to be able to bend over and tie his shoe.  His daughter appears to be getting better and she is starting to get out some more and is more helpful around the house.  He got some good news in that he was approved for 60% disability from the [place] but there is more documentation of that he needs to round up and turning and feels that the disability will increase after he gets all of that turned him.  It is frustrating but he recognizes in the long run this will be much better for him and his family. He is using his coping skills.  We did reemphasize the importance of intentional mindfulness for self-care.  He does contract for safety having no thoughts of hurting himself or anyone else.  Interventions: Cognitive Behavioral Therapy and Dialectical Behavioral Therapy  Diagnosis: Generalized anxiety disorder, irritability  Plan: I will meet with the patient virtually every 2 to 3 weeks.  Reviewed treatment plan on November 1 session. Target date December 19, 2022.  Progress: 35%  Goals will be to improve the patient's ability to manage anxiety and stress in a healthier way, identify causes for anxiety and explore ways to lower them, resolve any core conflicts contributing to anxiety and help the patient manage worrisome thoughts and thinking's contributing to stress and anxiety.  Interventions will include providing education about anxiety and stress to help him identify its causes, facilitate problem solution skills as well as teach coping skills to manage anxiety symptoms such as grounding exercises, progressive muscle relaxation etc.  We will also use cognitive behavioral  therapy to identify and change anxiety provoking thought and behavior patterns as well as use dialectical behavior therapy distress tolerance and mindfulness skills to help him learn better anxiety management skills.  I reviewed the goals for the patient and he would like to continue as goals stated above.  New target date is June 18, 2023. [place], [facility]                [place], [facility]               [place], [facility]               [place], [facility]               French Ana, [facility]               [place] Ana, [facility]               [place] Ana, [facility]               French Ana, [facility]               French Ana, [facility]               French Ana, [facility]               [place] Ana, [facility]               [place], [facility]           [place], [facility]               [place], [facility]

## 2023-05-13 ENCOUNTER — Encounter: Payer: Self-pay | Admitting: Podiatry

## 2023-05-13 ENCOUNTER — Ambulatory Visit (INDEPENDENT_AMBULATORY_CARE_PROVIDER_SITE_OTHER): Payer: 59 | Admitting: Podiatry

## 2023-05-13 DIAGNOSIS — B351 Tinea unguium: Secondary | ICD-10-CM

## 2023-05-13 DIAGNOSIS — M79674 Pain in right toe(s): Secondary | ICD-10-CM | POA: Diagnosis not present

## 2023-05-13 DIAGNOSIS — B353 Tinea pedis: Secondary | ICD-10-CM | POA: Diagnosis not present

## 2023-05-13 DIAGNOSIS — L84 Corns and callosities: Secondary | ICD-10-CM | POA: Diagnosis not present

## 2023-05-13 DIAGNOSIS — M79675 Pain in left toe(s): Secondary | ICD-10-CM | POA: Diagnosis not present

## 2023-05-13 DIAGNOSIS — E119 Type 2 diabetes mellitus without complications: Secondary | ICD-10-CM | POA: Diagnosis not present

## 2023-05-14 NOTE — Telephone Encounter (Signed)
Please advise as MD is out of office

## 2023-05-15 NOTE — Telephone Encounter (Signed)
**Note De-identified  [place] Obfuscation** Please advise 

## 2023-05-18 NOTE — Progress Notes (Signed)
  Subjective:  Patient ID: Randy Hendrix, male    DOB: 1965-09-11,  MRN: [id number]  58 y.o. male presents preventative diabetic foot care and callus(es) of both feet and painful mycotic toenails that are difficult to trim. Painful toenails interfere with ambulation. Aggravating factors include wearing enclosed shoe gear. Pain is relieved with periodic professional debridement. Painful calluses are aggravated when weightbearing with and without shoegear. Pain is relieved with periodic professional debridement. He states he has not been applying the foot spray between his toes lately. Chief Complaint  Patient presents with   Diabetes    "Do my nails and check my feet."  Dr. Okey Dupre - 04/04/2023; A1c - 12.4   New problem(s): None   PCP is Myrlene Broker, MD.  No Known Allergies  Review of Systems: Negative except as noted in the HPI.   Objective:  Randy Hendrix is a pleasant 58 y.o. male morbidly obese in NAD. AAO x 3.  Vascular Examination: Vascular status intact b/l with palpable pedal pulses. CFT immediate b/l. Pedal hair present. No edema. No pain with calf compression b/l. Skin temperature gradient WNL b/l. No varicosities noted. No cyanosis or clubbing noted.  Neurological Examination: Sensation grossly intact b/l with 10 gram monofilament. Vibratory sensation intact b/l.  Dermatological Examination: Pedal skin with normal turgor, texture and tone b/l. Toenails 1-5 b/l thick, discolored, elongated with subungual debris and pain on dorsal palpation.   Interdigital maceration noted bilateral 1st-4th webspace(s). No blistering, no weeping, no open wounds. Hyperkeratotic lesion(s) submet head 5 b/l.  No erythema, no edema, no drainage, no fluctuance.  Musculoskeletal Examination: Muscle strength 5/5 to b/l LE. Pes planus deformity noted bilateral LE.  Radiographs: None  Last A1c:      Latest Ref Rng & Units 04/14/2023    8:30 AM 11/01/2022   10:27 AM   Hemoglobin A1C  Hemoglobin-A1c 4.6 - 6.5 % 12.4  7.3      Assessment:   1. Pain due to onychomycosis of toenails of both feet   2. Callus   3. Tinea pedis of both feet   4. Type 2 diabetes mellitus without complication, without long-term current use of insulin (HCC)    Plan:  -Consent given for treatment as described below: -Examined patient. -Toenails 1-5 b/l were debrided in length and girth with sterile nail nippers and dremel without iatrogenic bleeding.  -Callus(es) submet head 5 b/l pared utilizing sterile scalpel blade without complication or incident. Total number debrided =2. -Continue antifungal spray powder between toes once daily. -Patient/POA to call should there be question/concern in the interim.  Return in about 3 months (around 08/13/2023).  Freddie Breech, DPM      [place] LOCATION: [address], [place] [address]                   Office [phone]   [facility] LOCATION: [address] [place], [place] [address] Office [phone]

## 2023-05-22 ENCOUNTER — Encounter: Payer: Self-pay | Admitting: Behavioral Health

## 2023-05-22 ENCOUNTER — Ambulatory Visit: Payer: 59 | Admitting: Behavioral Health

## 2023-05-22 DIAGNOSIS — F411 Generalized anxiety disorder: Secondary | ICD-10-CM | POA: Diagnosis not present

## 2023-05-22 DIAGNOSIS — R454 Irritability and anger: Secondary | ICD-10-CM | POA: Diagnosis not present

## 2023-05-22 NOTE — Progress Notes (Signed)
 [facility] Counselor/Therapist Progress Note  Patient ID: Randy Hendrix, MRN: [id number],    Date: 05/22/2023  Time Spent: 58 minutes, 11:01 AM until 11:59 AM this session was held via video teletherapy. The patient consented to the video teletherapy and was located in his car during this session. He is aware it is the responsibility of the patient to secure confidentiality on his end of the session. The provider was in a private home office for the duration of this session.      Treatment Type: Individual Therapy  Reported Symptoms: Anxiety/irritability  Mental Status Exam: Appearance:  Fairly Groomed     Behavior: Appropriate  Motor: Normal  Speech/Language:  Normal Rate  Affect: Appropriate  Mood: normal  Thought process: normal  Thought content:   WNL  Sensory/Perceptual disturbances:   WNL  Orientation: oriented to person, place, time/date, situation, day of week, month of year, and year  Attention: Good  Concentration: Good  Memory: WNL  Fund of knowledge:  Good  Insight:   Good  Judgment:  Good  Impulse Control: Good   Risk Assessment: Danger to Self:  No Self-injurious Behavior: No Danger to Others: No Duty to Warn:no Physical Aggression / Violence:No  Access to Firearms a concern: No  Gang Involvement:No   Subjective: The patient's wife is having significant foot surgery tomorrow so most of what they are doing now is what he describes as "nesting.".  He feels that they have most everything ready in terms of what she needs to recover well cooking and cleaning etc.  They have gone through a lot of things which they do not use anymore which makes everybody feel better.  He feels that they are as prepared for and his wife is as prepared for surgery as she can be.  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  He is currently improved for 60% but once he gets some records prior to 2017 he can submit them with the hopes of being 100% approved.  We talked about looking at this long-term part of the job but he does not physically demanding and this being a landing spot for him if he can be in a better place financially through disability.  We talked about the fact that he did what he was supposed to have done for years without all the right equipment and this is in some way compensation for not having the tools which led to some of his physical disabilities now.  He is using his coping skills.  We did reemphasize the importance of intentional mindfulness for self-care.  He does contract for safety having no thoughts of hurting himself or anyone else.  Interventions: Cognitive Behavioral Therapy and Dialectical Behavioral Therapy  Diagnosis: Generalized anxiety disorder, irritability  Plan: I will meet with the patient virtually every 2 to 3 weeks.  Reviewed treatment plan on November 1 session. Target date December 19, 2022.  Progress: 35% Goals will be to improve the patient's ability to manage anxiety and stress in a healthier way, identify causes for anxiety and explore ways to lower them, resolve any core conflicts contributing to anxiety and help the patient manage worrisome thoughts and thinking's contributing to stress and anxiety.  Interventions will include providing education about anxiety and stress to help him identify its causes, facilitate problem solution skills  as well as teach coping skills to manage anxiety symptoms such as grounding exercises, progressive muscle relaxation etc.  We will also use cognitive behavioral therapy to identify and change anxiety provoking thought and behavior patterns as well as use dialectical behavior therapy distress tolerance and mindfulness skills to help him learn better anxiety management skills.  I reviewed the goals for the patient and he would  like to continue as goals stated above.  New target date is June 18, 2023. French Ana, [facility]                French Ana, [facility]               French Ana, [facility]               French Ana, [facility]               French Ana, [facility]               French Ana, [facility]               French Ana, [facility]               French Ana, [facility]               French Ana, [facility]               French Ana, [facility]               French Ana, [facility]               French Ana, [facility]           French Ana, [facility]               French Ana, [facility]               French Ana, [facility]

## 2023-05-30 ENCOUNTER — Ambulatory Visit: Admitting: Internal Medicine

## 2023-06-02 NOTE — Progress Notes (Deleted)
 Chief Complaint: Alternating bowel habits Primary GI MD: Dr. Adela Lank  HPI: 58 year old male history of OSA, diabetes, hypertension, and others as listed below presents for evaluation of    06/26/2015 colonoscopy with 1 small tubular adenoma and diverticulosis as well as internal hemorrhoids.  Repeat recommended in 5 years.    12/01/2017 patient met with Dr. Adela Lank for IBS symptoms.  Had a CT the abdomen pelvis 09/24/2017 with mild wall thickening of the bladder, borderline right inguinal lymphadenopathy, mildly prominent peripancreatic lymph nodes, suspected renal cysts, sigmoid diverticulosis and lumbar impingement.  Also noted chronic microcytic anemia.  That time discussed postinfectious IBS.  Continued IBgard as it was helping.  Also recommended the low FODMAP diet.  Also started on Citrucel.  Recommended repeat CT scan in 1 to 2 months to check on lymphadenopathy.    03/10/2018 patient seen for 3 days of painless hematochezia.  Only minimally inflamed internal hemorrhoids on anoscopy.  It was thought patient had a diverticular hemorrhage.    10/19/2020, OV, He explains that ever since 2017 or 2018 and having the "gastrointestinal flu", he has had symptoms off and on of some periumbilical/epigastric pain and cramping throughout his abdomen with radiation between diarrhea and constipation with a lot of excessive gas.  Was scheduled for EGD and colonoscopy with Dr. Adela Lank, which she had performed 12/14/2020.  EGD revealed 2 cm hiatal hernia and some gastritis, biopsies negative for H. pylori.  Colonoscopy showed some polyps, both tubular adenomas and hyperplastic polyps, diverticulosis, and hemorrhoids.      05/2021 presenting with alternating bowel habits with diarrhea, gas, bloating.  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  SIBO breath test not completed   Discussed the use of AI scribe software for clinical note transcription with the patient, who gave verbal consent to  proceed.  History of Present Illness      PREVIOUS GI WORKUP   EGD 11/2020 - Esophagogastric landmarks identified.  - 2 cm hiatal hernia.  - Normal esophagus otherwise  - Erythematous mucosa in the gastric body and antrum.  - Normal stomach otherwise - biopsies taken to rule out H pylori  - Normal duodenal bulb and second portion of the duodenum.  Colonoscopy scratch that - H. pylori negative  Colonoscopy 11/2020 - Limited views of the distal ileum were normal.  - One 3 to 4 mm polyp in the transverse colon, removed with a cold snare. Resected and retrieved - One 3 mm polyp in the sigmoid colon, removed with a cold snare. Resected and retrieved.  - Diverticulosis in the transverse colon and in the left colon.  - Internal hemorrhoids.  - The examination was otherwise normal. - Biopsied as tubular adenoma and hyperplastic polyp - Repeat 7 years  Past Medical History:  Diagnosis Date   Allergic rhinitis    ALLERGIC RHINITIS    Allergy    Anxiety    Gastroenteritis    GERD (gastroesophageal reflux disease)    occasionally   Gout    Hemorrhoid    Kidney stone on right side 05/2004   OBESITY, CLASS II    OSA (obstructive sleep apnea)    CPAP qhs   Oxygen deficiency    PATELLAR DISLOCATION, RIGHT    Sleep apnea    wears cpap    Past Surgical History:  Procedure Laterality Date   HEMORRHOID SURGERY     WISDOM TOOTH EXTRACTION      Current Outpatient Medications  Medication Sig Dispense Refill   allopurinol (ZYLOPRIM) 100 MG  tablet TAKE 1 TABLET(100 MG) BY MOUTH DAILY 90 tablet 3   azelastine (ASTELIN) 0.1 % nasal spray USE 2 SPRAYS IN EACH NOSTRIL TWICE DAILY AS NEEDED 30 mL 6   Cyanocobalamin (VITAMIN B 12 PO) Take 1 tablet by mouth daily.     cyclobenzaprine (FLEXERIL) 10 MG tablet Take 1 tablet (10 mg total) by mouth 3 (three) times daily as needed for muscle spasms. 60 tablet 0   diazepam (VALIUM) 5 MG tablet Take one tablet by mouth with food one hour prior  to procedure. May repeat 30 minutes prior if needed. 2 tablet 0   dicyclomine (BENTYL) 10 MG capsule Take 1 capsule (10 mg total) by mouth 2 (two) times daily before a meal. 60 capsule 5   empagliflozin (JARDIANCE) 25 MG TABS tablet Take 1 tablet (25 mg total) by mouth daily. 90 tablet 3   fexofenadine (ALLEGRA) 180 MG tablet Take 180 mg by mouth daily.     Garcinia Cambogia-Chromium 500-200 MG-MCG TABS Take 1 capsule by mouth daily.     glucose blood (ACCU-CHEK GUIDE) test strip TEST FOUR TIMES DAILY AS DIRECTED 100 strip 2   Green Tea, Camillia sinensis, (GREEN TEA PO) Take 1 capsule by mouth daily.     Lancets (ONETOUCH DELICA PLUS LANCET30G) MISC Use daily up to 4 times a day 400 each 3   losartan (COZAAR) 50 MG tablet Take 1 tablet (50 mg total) by mouth daily. 90 tablet 3   metFORMIN (GLUCOPHAGE) 1000 MG tablet Take 1 tablet (1,000 mg total) by mouth 2 (two) times daily with a meal. 180 tablet 3   methocarbamol (ROBAXIN) 500 MG tablet Take 1 tablet (500 mg total) by mouth 3 (three) times daily. 60 tablet 0   methylcellulose (CITRUCEL) oral powder Citrucel: use as directed once daily (Patient taking differently: Taking 1 dose as needed)     mometasone (NASONEX) 50 MCG/ACT nasal spray Place 2 sprays into the nose daily.  1   Multiple Vitamin (MULTIVITAMIN) capsule Take 1 capsule by mouth daily.     omeprazole (PRILOSEC) 20 MG capsule Take 1 capsule (20 mg total) by mouth daily. 90 capsule 3   Semaglutide, 1 MG/DOSE, 4 MG/3ML SOPN Inject 1 mg as directed once a week. 3 mL 0   Semaglutide, 2 MG/DOSE, 8 MG/3ML SOPN Inject 2 mg as directed once a week. 3 mL 5   SYSTANE ULTRA 0.4-0.3 % SOLN SMARTSIG:1 Drop(s) In Eye(s) PRN     traMADol (ULTRAM) 50 MG tablet Take 1 tablet (50 mg total) by mouth every 8 (eight) hours as needed. 15 tablet 0   VITAMIN D PO Take by mouth.     No current facility-administered medications for this visit.    Allergies as of 06/03/2023   (No Known Allergies)     Family History  Problem Relation Age of Onset   Diabetes Mother    Anemia Mother    Immunodeficiency Mother    Stroke Father    Dementia Father    Hypertension Father    Stomach cancer Maternal Grandmother    Colon cancer Neg Hx    Prostate cancer Neg Hx    Cancer Neg Hx    Heart disease Neg Hx    COPD Neg Hx    Colon polyps Neg Hx    Esophageal cancer Neg Hx    Rectal cancer Neg Hx     Social History   Socioeconomic History   Marital status: Married    Spouse name: Annetta  Number of children: 2   Years of education: 12   Highest education level: Not on file  Occupational History   Occupation: k Proofreader in maintenance    Employer: [employer]  Tobacco Use   Smoking status: Never   Smokeless tobacco: Never  Vaping Use   Vaping status: Never Used  Substance and Sexual Activity   Alcohol use: No    Alcohol/week: 0.0 standard drinks of alcohol   Drug use: No   Sexual activity: Yes    Partners: Female  Other Topics Concern   Not on file  Social History Narrative   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 Married 1990. 1 daughter 2002 and 1 son 15. SO-multiple medical problems- but currently stable. Daughter w/ petit mal seizures. Marriage is in good health.     Social Drivers of Corporate investment banker Strain: Not on file  Food Insecurity: Not on file  Transportation Needs: Not on file  Physical Activity: Not on file  Stress: Not on file  Social Connections: Not on file  Intimate Partner Violence: Not on file    Review of Systems:    Constitutional: No weight loss, fever, chills, weakness or fatigue HEENT: Eyes: No change in vision               Ears, Nose, Throat:  No change in hearing or congestion Skin: No rash or itching Cardiovascular: No chest pain, chest pressure or palpitations   Respiratory: No SOB or cough Gastrointestinal: See HPI and otherwise negative Genitourinary: No dysuria or change in urinary  frequency Neurological: No headache, dizziness or syncope Musculoskeletal: No new muscle or joint pain Hematologic: No bleeding or bruising Psychiatric: No history of depression or anxiety    Physical Exam:  Vital signs: There were no vitals taken for this visit.  Constitutional: NAD, alert and cooperative Head:  Normocephalic and atraumatic. Eyes:   PEERL, EOMI. No icterus. Conjunctiva pink. Respiratory: Respirations even and unlabored. Lungs clear to auscultation bilaterally.   No wheezes, crackles, or rhonchi.  Cardiovascular:  Regular rate and rhythm. No peripheral edema, cyanosis or pallor.  Gastrointestinal:  Soft, nondistended, nontender. No rebound or guarding. Normal bowel sounds. No appreciable masses or hepatomegaly. Rectal:  Declines Msk:  Symmetrical without gross deformities. Without edema, no deformity or joint abnormality.  Neurologic:  Alert and  oriented x4;  grossly normal neurologically.  Skin:   Dry and intact without significant lesions or rashes. Psychiatric: Oriented to person, place and time. Demonstrates good judgement and reason without abnormal affect or behaviors.  Physical Exam    RELEVANT LABS AND IMAGING: CBC    Component Value Date/Time   WBC 8.4 04/14/2023 0830   RBC 5.24 04/14/2023 0830   HGB 11.5 (L) 04/14/2023 0830   HCT 36.3 (L) 04/14/2023 0830   PLT 211.0 04/14/2023 0830   MCV 69.3 Repeated and verified X2. (L) 04/14/2023 0830   MCH 21.3 (L) 07/04/2018 1659   MCHC 31.8 04/14/2023 0830   RDW 17.9 (H) 04/14/2023 0830   LYMPHSABS 2.6 08/29/2021 0830   MONOABS 0.6 08/29/2021 0830   EOSABS 1.4 (H) 08/29/2021 0830   BASOSABS 0.1 08/29/2021 0830    CMP     Component Value Date/Time   NA 135 04/14/2023 0830   K 4.4 04/14/2023 0830   CL 98 04/14/2023 0830   CO2 23 04/14/2023 0830   GLUCOSE 414 (H) 04/14/2023 0830   GLUCOSE 103 (H) 02/03/2006 0738   BUN 16 04/14/2023  0830   CREATININE 1.00 04/14/2023 0830   CALCIUM 9.2 04/14/2023  0830   PROT 6.5 04/14/2023 0830   ALBUMIN 4.2 04/14/2023 0830   AST 20 04/14/2023 0830   ALT 30 04/14/2023 0830   ALKPHOS 90 04/14/2023 0830   BILITOT 1.4 (H) 04/14/2023 0830   GFRNONAA >60 07/04/2018 1659   GFRAA >60 07/04/2018 1659     Assessment/Plan:   Assessment and Plan Assessment & Plan        Gigi Kyle [facility] 06/02/2023, 1:23 PM  Cc: Adelia Homestead, *

## 2023-06-03 ENCOUNTER — Ambulatory Visit: Admitting: Gastroenterology

## 2023-06-12 ENCOUNTER — Encounter: Payer: Self-pay | Admitting: Behavioral Health

## 2023-06-12 ENCOUNTER — Ambulatory Visit: Payer: 59 | Admitting: Behavioral Health

## 2023-06-12 DIAGNOSIS — R454 Irritability and anger: Secondary | ICD-10-CM

## 2023-06-12 DIAGNOSIS — F411 Generalized anxiety disorder: Secondary | ICD-10-CM | POA: Diagnosis not present

## 2023-06-12 DIAGNOSIS — F331 Major depressive disorder, recurrent, moderate: Secondary | ICD-10-CM

## 2023-06-12 NOTE — Progress Notes (Signed)
 [facility] Counselor/Therapist Progress Note  Patient ID: Randy Hendrix, MRN: [id number],    Date: 06/12/2023  Time Spent: 53 minutes, 11:01 AM until 11:54 AM this session was held via video teletherapy. The patient consented to the video teletherapy and was located in his car during this session. He is aware it is the responsibility of the patient to secure confidentiality on his end of the session. The provider was in a private home office for the duration of this session.      Treatment Type: Individual Therapy  Reported Symptoms: Anxiety/irritability  Mental Status Exam: Appearance:  Fairly Groomed     Behavior: Appropriate  Motor: Normal  Speech/Language:  Normal Rate  Affect: Appropriate  Mood: normal  Thought process: normal  Thought content:   WNL  Sensory/Perceptual disturbances:   WNL  Orientation: oriented to person, place, time/date, situation, day of week, month of year, and year  Attention: Good  Concentration: Good  Memory: WNL  Fund of knowledge:  Good  Insight:   Good  Judgment:  Good  Impulse Control: Good   Risk Assessment: Danger to Self:  No Self-injurious Behavior: No Danger to Others: No Duty to Warn:no Physical Aggression / Violence:No  Access to Firearms a concern: No  Gang Involvement:No   Subjective: The patient's wife had foot surgery a few weeks ago and for the most part is doing well.  It is still extensive enough that she can put much weight on and has to use a knee scooter or a scooter and so that has increased the patient's responsibilities at home.  He feels that he is doing a good job of managing his stress and balancing responsibilities at home and at work.  For the most part he is sleeping fairly well.  When his wife is resting he tries to get some downtime on days that he is not working or gets her out of the house which is good for their mental and emotional wellbeing.  His son and daughter have been helping  out with her second job which she appreciates.  He still short handed at work but they will bring in some temporary help for now as he gets busier.  He wishes they would hire someone full-time but knows that he will work with what he has available.  He still feels he is managing his stress pretty well there.  He will get back to looking at the additional [place] paperwork sometime next week having giving his wife some time to recover and help him with that.  He is using his coping skills.  We did reemphasize the importance of intentional mindfulness for self-care.  He does contract for safety having no thoughts of hurting himself or anyone else.  Interventions: Cognitive Behavioral Therapy and Dialectical Behavioral Therapy  Diagnosis: Generalized anxiety disorder, irritability  Plan: I will meet with the patient virtually every 2 to 3 weeks.  Reviewed treatment plan on November 1 session. Target date December 19, 2022.  Progress: 35% Goals will be to improve the patient's ability to manage anxiety and stress in a healthier way, identify causes for anxiety and explore ways to lower them, resolve any core conflicts contributing to anxiety and help the patient manage worrisome thoughts and thinking's contributing to stress and anxiety.  Interventions will include providing education about anxiety and stress to help him identify its causes, facilitate problem solution skills as well as teach coping skills to manage anxiety symptoms such as grounding exercises, progressive  muscle relaxation etc.  We will also use cognitive behavioral therapy to identify and change anxiety provoking thought and behavior patterns as well as use dialectical behavior therapy distress tolerance and mindfulness skills to help him learn better anxiety management skills.  I reviewed the goals for the patient and he would like to continue as goals stated above.  New target date is June 18, 2023. Cecile Coder,  [facility]                Cecile Coder, [facility]               Cecile Coder, [facility]               Cecile Coder, [facility]               Cecile Coder, [facility]               Cecile Coder, [facility]               Cecile Coder, [facility]               Cecile Coder, [facility]               Cecile Coder, [facility]               Cecile Coder, [facility]               Cecile Coder, [facility]               Cecile Coder, [facility]           Cecile Coder, [facility]               Cecile Coder, [facility]               Cecile Coder, [facility]               Cecile Coder, [facility]

## 2023-06-24 NOTE — Progress Notes (Signed)
 HPI  M never smoker followed for OSA, former third shift worker, complicated by morbid obesity, allergic rhinitis, GERD, NPSG 2000:  AHI 124/hr  ----------------------------------------------------------------   06/25/22-  58 year old male never smoker followed for OSA, Second -shift Worker, complicated by GERD, allergic rhinitis, obesity, DM2, Gout, Anxiety,  CPAP  Auto 10-20/[facility] Download-compliance 100%, AHI 0.4/ hr Body weight today-329 lbs -----Pt doing well. No issues with CPAP machine Download reviewed.  He is here with his wife confirms good control.  He works second shift and finds it hard to unwind but then sleeps soundly once asleep.  Had questions about cleaning his machine which we answered.  06/26/23- 58 year old male never smoker followed for OSA, Second -shift Worker, complicated by GERD, allergic rhinitis, obesity, DM2, Gout, Anxiety,  CPAP  Auto 10-20/[facility] Download-compliance 100%. AHI 0.5/hr Body weight today-303 lbs -----Doing well.  Sleep is good.   Discussed the use of AI scribe software for clinical note transcription with the patient, who gave verbal consent to proceed.  History of Present Illness   Randy Hendrix is a 58 year old male with sleep apnea who presents for CPAP therapy follow-up. He is accompanied by his wife.  He uses his CPAP machine nightly with a pressure setting between eleven and twelve centimeters of water, experiencing less than one breakthrough apnea per hour. Snoring and mouth breathing occur while using a nasal mask. He sleeps six to six and a half hours on nights without appointments, with additional sleep on weekends. Frequent naps occur, often in a chair, but he feels his sleep is stable. His wife notes snoring through the CPAP and occasional sleep duration of four and a half to five hours. We discussed need for adequate sleep time. He has trouble unwinding on return home from work. Randy Hendrix He denies issues with  the CPAP machine's performance. He is using a nasal mask and we discussed trial of sleep tape to keep mouth closed.     Assessment and Plan:    Obstructive Sleep Apnea Obstructive sleep apnea well-managed with CPAP. Apnea events <1/hr. Snoring and mouth breathing likely due to air escape from nasal mask. CPAP provider transition needed. - Recommend snort tape or sleep tape to reduce mouth breathing and air escape. - Coordinate CPAP service transition to new provider in [place].   Sleep Hygiene -encourage good sleep habits and adequate sleep time   Obesity -continue to work on diet, exsercise         ROS-see HPI + = positive Constitutional:    weight loss, night sweats, fevers, chills, fatigue, lassitude. HEENT:    headaches, difficulty swallowing, tooth/dental problems, sore throat,       sneezing, itching, ear ache, nasal congestion, post nasal drip, snoring CV:    chest pain, orthopnea, PND, swelling in lower extremities, anasarca,                                                     dizziness, palpitations Resp:   shortness of breath with exertion or at rest.                productive cough,  + non-productive cough, coughing up of blood.              change in color of mucus.  +wheezing.   Skin:    rash  or lesions. GI:  No-   heartburn, indigestion, abdominal pain, nausea, vomiting, diarrhea,                 change in bowel habits, loss of appetite GU: dysuria, change in color of urine, no urgency or frequency.   flank pain. MS:   joint pain, stiffness, decreased range of motion, back pain. Neuro-     nothing unusual Psych:  change in mood or affect.  depression or anxiety.   memory loss.  OBJ- Physical Exam General- Alert, Oriented, Affect-appropriate, Distress- none acute + obese Skin- rash-none, lesions- none, excoriation- none Lymphadenopathy- none Head- atraumatic            Eyes- Gross vision intact, PERRLA, conjunctivae and secretions clear            Ears-  Hearing, canals-normal            Nose- Clear, no-Septal dev, mucus, polyps, erosion, perforation             Throat- Mallampati IV , mucosa clear , drainage- none, tonsils- atrophic Neck- flexible , trachea midline, no stridor , thyroid  nl, carotid no bruit Chest - symmetrical excursion , unlabored           Heart/CV- RRR , no murmur , no gallop  , no rub, nl s1 s2                           - JVD- none , edema+1, stasis changes- none, varices- none           Lung- + trace basilar crackles/unlabored, wheeze+mild, cough- none , dullness-none, rub- none           Chest wall-  Abd-  Br/ Gen/ Rectal- Not done, not indicated Extrem- cyanosis- none, clubbing, none, atrophy- none, strength- nl Neuro- grossly intact to observation

## 2023-06-26 ENCOUNTER — Ambulatory Visit: Payer: 59 | Admitting: Internal Medicine

## 2023-06-26 ENCOUNTER — Encounter: Payer: Self-pay | Admitting: Internal Medicine

## 2023-06-26 VITALS — BP 112/78 | HR 83 | Temp 97.8°F | Ht 70.0 in | Wt 303.4 lb

## 2023-06-26 DIAGNOSIS — G4733 Obstructive sleep apnea (adult) (pediatric): Secondary | ICD-10-CM

## 2023-06-26 DIAGNOSIS — E669 Obesity, unspecified: Secondary | ICD-10-CM

## 2023-06-26 DIAGNOSIS — Z6841 Body Mass Index (BMI) 40.0 and over, adult: Secondary | ICD-10-CM | POA: Diagnosis not present

## 2023-06-26 NOTE — Patient Instructions (Addendum)
 We can continue CPAP [facility]  [facility] to continue CPAP care- replace old CPAP machine, auto 10-20, mask of choice, humidifier, supplies, AirView/ card.  [facility] is getting out of CPAP business and patient and wife live in [place].[name]

## 2023-07-04 ENCOUNTER — Ambulatory Visit: Admitting: Behavioral Health

## 2023-07-10 ENCOUNTER — Encounter: Payer: Self-pay | Admitting: Behavioral Health

## 2023-07-10 ENCOUNTER — Ambulatory Visit (INDEPENDENT_AMBULATORY_CARE_PROVIDER_SITE_OTHER): Admitting: Behavioral Health

## 2023-07-10 DIAGNOSIS — F411 Generalized anxiety disorder: Secondary | ICD-10-CM | POA: Diagnosis not present

## 2023-07-10 DIAGNOSIS — R454 Irritability and anger: Secondary | ICD-10-CM

## 2023-07-10 NOTE — Progress Notes (Addendum)
 [facility] Counselor/Therapist Progress Note  Patient ID: Randy Hendrix, MRN: [id number],    Date: 07/10/2023  Time Spent: 50 minutes, 11:01 AM until 11:51 AM this session was held via video teletherapy. The patient consented to the video teletherapy and was located in his car during this session. He is aware it is the responsibility of the patient to secure confidentiality on his end of the session. The provider was in a private home office for the duration of this session.      Treatment Type: Individual Therapy  Reported Symptoms: Anxiety/irritability  Mental Status Exam: Appearance:  Fairly Groomed     Behavior: Appropriate  Motor: Normal  Speech/Language:  Normal Rate  Affect: Appropriate  Mood: normal  Thought process: normal  Thought content:   WNL  Sensory/Perceptual disturbances:   WNL  Orientation: oriented to person, place, time/date, situation, day of week, month of year, and year  Attention: Good  Concentration: Good  Memory: WNL  Fund of knowledge:  Good  Insight:   Good  Judgment:  Good  Impulse Control: Good   Risk Assessment: Danger to Self:  No Self-injurious Behavior: No Danger to Others: No Duty to Warn:no Physical Aggression / Violence:No  Access to Firearms a concern: No  Gang Involvement:No   Subjective: The patient's wife's foot continues to improve and she is hopeful to be out of the boot in a few days.  His daughter has been very helpful in picking up what his wife had not been doing in terms of their secondary job.  He recognizes that she wants to help as well as soon as she can.  He acknowledges that he stays tired because of all that he has to do but feels that he is managing that fairly well.  He also got a new team made at work who is working very hard to learn the job and for the most part the patient says he is learning well and they are learning each other fairly well.  He feels that he can Mentor this young man in  a very positive way.  He feels for the most part he is managing his health as well as he can and is keeping up with appointments in his medication.  He relies on his faith as a way of coping as well as the importance of time with family.  He also says yard work is very therapeutic for him. Interventions: Cognitive Behavioral Therapy and Dialectical Behavioral Therapy  Diagnosis: Generalized anxiety disorder, irritability  Plan: I will meet with the patient virtually every 2 to 3 weeks.  Reviewed treatment plan on November 1 session. Target date December 19, 2022.  Progress: 40% Goals will be to improve the patient's ability to manage anxiety and stress in a healthier way, identify causes for anxiety and explore ways to lower them, resolve any core conflicts contributing to anxiety and help the patient manage worrisome thoughts and thinking's contributing to stress and anxiety.  Interventions will include providing education about anxiety and stress to help him identify its causes, facilitate problem solution skills as well as teach coping skills to manage anxiety symptoms such as grounding exercises, progressive muscle relaxation etc.  We will also use cognitive behavioral therapy to identify and change anxiety provoking thought and behavior patterns as well as use dialectical behavior therapy distress tolerance and mindfulness skills to help him learn better anxiety management skills.  I reviewed the goals for the patient and he would like to  continue as goals stated above.  New target date is December 19, 2023. Cecile Coder, [facility]                Cecile Coder, [facility]               Cecile Coder, [facility]               Cecile Coder, [facility]               Cecile Coder, [facility]               Cecile Coder, [facility]               Cecile Coder, [facility]               Cecile Coder,  [facility]               Cecile Coder, [facility]               Cecile Coder, [facility]               Cecile Coder, [facility]               Cecile Coder, [facility]           Cecile Coder, [facility]               Cecile Coder, [facility]               Cecile Coder, [facility]               Cecile Coder, [facility]               Cecile Coder, [facility]

## 2023-07-11 ENCOUNTER — Ambulatory Visit: Admitting: Behavioral Health

## 2023-07-16 ENCOUNTER — Encounter: Payer: Self-pay | Admitting: Internal Medicine

## 2023-07-18 ENCOUNTER — Ambulatory Visit: Admitting: Internal Medicine

## 2023-07-18 VITALS — BP 128/68 | HR 90 | Temp 98.4°F | Ht 70.0 in | Wt 311.0 lb

## 2023-07-18 DIAGNOSIS — E1169 Type 2 diabetes mellitus with other specified complication: Secondary | ICD-10-CM

## 2023-07-18 DIAGNOSIS — M25572 Pain in left ankle and joints of left foot: Secondary | ICD-10-CM

## 2023-07-18 DIAGNOSIS — R42 Dizziness and giddiness: Secondary | ICD-10-CM | POA: Diagnosis not present

## 2023-07-18 DIAGNOSIS — G8929 Other chronic pain: Secondary | ICD-10-CM

## 2023-07-18 DIAGNOSIS — M25561 Pain in right knee: Secondary | ICD-10-CM

## 2023-07-18 DIAGNOSIS — Z7985 Long-term (current) use of injectable non-insulin antidiabetic drugs: Secondary | ICD-10-CM

## 2023-07-18 LAB — POCT GLYCOSYLATED HEMOGLOBIN (HGB A1C): HbA1c POC (<> result, manual entry): 5.6 % (ref 4.0–5.6)

## 2023-07-18 NOTE — Progress Notes (Signed)
   Subjective:   Patient ID: Randy Hendrix, male    DOB: 29-Oct-1965, 58 y.o.   MRN: [id number]  HPI The patient is a 58 YO man coming in for medical management (see A/P for details). Worsening right knee, left ankle, lower back pain and vertigo.  Review of Systems  Constitutional: Negative.   HENT: Negative.    Eyes: Negative.   Respiratory:  Negative for cough, chest tightness and shortness of breath.   Cardiovascular:  Negative for chest pain, palpitations and leg swelling.  Gastrointestinal:  Negative for abdominal distention, abdominal pain, constipation, diarrhea, nausea and vomiting.  Musculoskeletal:  Positive for arthralgias and myalgias.  Skin: Negative.   Neurological:  Positive for dizziness.  Psychiatric/Behavioral: Negative.      Objective:  Physical Exam Constitutional:      Appearance: He is well-developed. He is obese.  HENT:     Head: Normocephalic and atraumatic.  Cardiovascular:     Rate and Rhythm: Normal rate and regular rhythm.  Pulmonary:     Effort: Pulmonary effort is normal. No respiratory distress.     Breath sounds: Normal breath sounds. No wheezing or rales.  Abdominal:     General: Bowel sounds are normal. There is no distension.     Palpations: Abdomen is soft.     Tenderness: There is no abdominal tenderness. There is no rebound.  Musculoskeletal:        General: Tenderness present.     Cervical back: Normal range of motion.  Skin:    General: Skin is warm and dry.  Neurological:     Mental Status: He is alert and oriented to person, place, and time.     Coordination: Coordination normal.     Vitals:   07/18/23 0913  BP: 128/68  Pulse: 90  Temp: 98.4 F (36.9 C)  TempSrc: Oral  SpO2: 96%  Weight: (!) 311 lb (141.1 kg)  Height: 5\' 10"  (1.778 m)    Assessment & Plan:

## 2023-07-18 NOTE — Patient Instructions (Signed)
 We will do physical therapy and keep the ozempic .

## 2023-07-18 NOTE — Assessment & Plan Note (Signed)
Referral to vestibular therapy

## 2023-07-18 NOTE — Assessment & Plan Note (Signed)
 POC HGA1c done and 5.6 today which is markedly better than last time. He is only taking ozempic  now and will continue this. Follow up 3 months.

## 2023-07-18 NOTE — Assessment & Plan Note (Signed)
 Worsening gradually referral to PT

## 2023-07-18 NOTE — Assessment & Plan Note (Signed)
 Gradually worsening referral to PT

## 2023-07-24 ENCOUNTER — Ambulatory Visit: Admitting: Behavioral Health

## 2023-07-24 ENCOUNTER — Encounter: Payer: Self-pay | Admitting: Behavioral Health

## 2023-07-24 DIAGNOSIS — F411 Generalized anxiety disorder: Secondary | ICD-10-CM | POA: Diagnosis not present

## 2023-07-24 DIAGNOSIS — R454 Irritability and anger: Secondary | ICD-10-CM

## 2023-07-24 NOTE — Progress Notes (Signed)
 [facility] Counselor/Therapist Progress Note  Patient ID: Randy Hendrix, MRN: [id number],    Date: 07/24/2023  Time Spent: 50 minutes, 11:04 AM until 11:51 AM this session was held via video teletherapy.  There was a poor video connection and after multiple attempts to fix the problem and/or starting over again there was still no clear video/audio.  At 11:07 AM we switched to a phone/audio session which went until 11:51 AM.  The patient consented to the video teletherapy and was located in his car during this session. He is aware it is the responsibility of the patient to secure confidentiality on his end of the session. The provider was in a private home office for the duration of this session.      Treatment Type: Individual Therapy  Reported Symptoms: Anxiety/irritability  Mental Status Exam: Appearance:  Fairly Groomed     Behavior: Appropriate  Motor: Normal  Speech/Language:  Normal Rate  Affect: Appropriate  Mood: normal  Thought process: normal  Thought content:   WNL  Sensory/Perceptual disturbances:   WNL  Orientation: oriented to person, place, time/date, situation, day of week, month of year, and year  Attention: Good  Concentration: Good  Memory: WNL  Fund of knowledge:  Good  Insight:   Good  Judgment:  Good  Impulse Control: Good   Risk Assessment: Danger to Self:  No Self-injurious Behavior: No Danger to Others: No Duty to Warn:no Physical Aggression / Violence:No  Access to Firearms a concern: No  Gang Involvement:No   Subjective: The patient does present with some stressors.  The young man helping him at work is still doing well and learning.  There is confusion as to whether he is temporary or they will making permanent which is created some discomfort but he knows he can only do so much to alleviate that.  His wife is doing much better and is now starting to walk a little bit around the house without the boot on the doctor's  pleased with her progress.  He met with his doctor who wants him to start on physical therapy thinking will help his back to me his feet and even improvement for some of the dizziness he has been experiencing when standing up too quickly.  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  He said that they have a plan in place but want to find to get into progressing through conversations about what they want that to look like for themselves and their children.  He reports that he is using his coping skills when he feels his anxiety levels escalating.  He does contract for safety having no thoughts of hurting himself or anyone else.  Interventions: Cognitive Behavioral Therapy and Dialectical Behavioral Therapy  Diagnosis: Generalized anxiety disorder, irritability  Plan: I will meet with the patient virtually every 2 to 3 weeks.  Reviewed treatment plan on November 1 session. Target date December 19, 2022.  Progress: 40% Goals will be to improve the patient's ability to manage anxiety and stress in a healthier way, identify causes for anxiety and explore ways to lower them, resolve any core conflicts contributing to anxiety and help the patient manage worrisome thoughts and thinking's contributing to stress and anxiety.  Interventions will include providing education about anxiety and stress to help him identify its causes, facilitate problem solution skills as well as teach coping skills to manage anxiety symptoms such as grounding exercises, progressive muscle  relaxation etc.  We will also use cognitive behavioral therapy to identify and change anxiety provoking thought and behavior patterns as well as use dialectical behavior therapy distress tolerance and mindfulness skills to help him learn better anxiety management skills.  I reviewed the goals for the patient and he would like to continue as goals stated above.  New target date  is December 19, 2023. Cecile Coder, [facility]                Cecile Coder, [facility]               Cecile Coder, [facility]               Cecile Coder, [facility]               Cecile Coder, [facility]               Cecile Coder, [facility]               Cecile Coder, [facility]               Cecile Coder, [facility]               Cecile Coder, [facility]               Cecile Coder, [facility]               Cecile Coder, [facility]               Cecile Coder, [facility]           Cecile Coder, [facility]               Cecile Coder, [facility]               Cecile Coder, [facility]               Cecile Coder, [facility]               Cecile Coder, [facility]               Cecile Coder, [facility]

## 2023-08-08 ENCOUNTER — Ambulatory Visit: Admitting: Behavioral Health

## 2023-08-08 ENCOUNTER — Encounter: Payer: Self-pay | Admitting: Behavioral Health

## 2023-08-08 DIAGNOSIS — R454 Irritability and anger: Secondary | ICD-10-CM | POA: Diagnosis not present

## 2023-08-08 DIAGNOSIS — F411 Generalized anxiety disorder: Secondary | ICD-10-CM

## 2023-08-08 NOTE — Progress Notes (Signed)
 [facility] Counselor/Therapist Progress Note  Patient ID: Randy Hendrix, MRN: [id number],    Date: 08/08/2023  Time Spent: 54 minutes, 11:02 AM until 11:56 AM this session was held via video teletherapy. The patient consented to the video teletherapy and was located in his car during this session. He is aware it is the responsibility of the patient to secure confidentiality on his end of the session. The provider was in a private home office for the duration of this session.      Treatment Type: Individual Therapy  Reported Symptoms: Anxiety/irritability  Mental Status Exam: Appearance:  Fairly Groomed     Behavior: Appropriate  Motor: Normal  Speech/Language:  Normal Rate  Affect: Appropriate  Mood: normal  Thought process: normal  Thought content:   WNL  Sensory/Perceptual disturbances:   WNL  Orientation: oriented to person, place, time/date, situation, day of week, month of year, and year  Attention: Good  Concentration: Good  Memory: WNL  Fund of knowledge:  Good  Insight:   Good  Judgment:  Good  Impulse Control: Good   Risk Assessment: Danger to Self:  No Self-injurious Behavior: No Danger to Others: No Duty to Warn:no Physical Aggression / Violence:No  Access to Firearms a concern: No  Gang Involvement:No   Subjective: The patient does present with some stressors. 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 His daughter had some scans recently and they are awaiting results. His uncle who had been ill dies recently. The pt. Will be starting PT next week to help with strength and balance. He still feels that he is maintaining life balance and is being intentional about trying to make time foe self care. He reports that he is using his coping skills when he feels his anxiety levels escalating.  He does contract for safety having no thoughts of hurting himself or  anyone else.  Interventions: Cognitive Behavioral Therapy and Dialectical Behavioral Therapy  Diagnosis: Generalized anxiety disorder, irritability  Plan: I will meet with the patient virtually every 2 to 3 weeks.  Reviewed treatment plan on November 1 session. Target date December 19, 2022.  Progress: 40% Goals will be to improve the patient's ability to manage anxiety and stress in a healthier way, identify causes for anxiety and explore ways to lower them, resolve any core conflicts contributing to anxiety and help the patient manage worrisome thoughts and thinking's contributing to stress and anxiety.  Interventions will include providing education about anxiety and stress to help him identify its causes, facilitate problem solution skills as well as teach coping skills to manage anxiety symptoms such as grounding exercises, progressive muscle relaxation etc.  We will also use cognitive behavioral therapy to identify and change anxiety provoking thought and behavior patterns as well as use dialectical behavior therapy distress tolerance and mindfulness skills to help him learn better anxiety management skills.  I reviewed the goals for the patient and he would like to continue as goals stated above.  New target date is December 19, 2023. Cecile Coder, [facility]                Cecile Coder, [facility]               Cecile Coder, [facility]               Cecile Coder, [facility]  Cecile Coder, [facility]               Cecile Coder, [facility]               Cecile Coder, [facility]               Cecile Coder, [facility]               Cecile Coder, [facility]               Cecile Coder, [facility]               Cecile Coder, [facility]               Cecile Coder, [facility]           Cecile Coder, [facility]               Cecile Coder,  [facility]               Cecile Coder, [facility]               Cecile Coder, [facility]               Cecile Coder, [facility]               Cecile Coder, [facility]               Cecile Coder, [facility]

## 2023-08-12 ENCOUNTER — Encounter: Payer: Self-pay | Admitting: Podiatry

## 2023-08-12 ENCOUNTER — Ambulatory Visit (INDEPENDENT_AMBULATORY_CARE_PROVIDER_SITE_OTHER): Payer: 59 | Admitting: Podiatry

## 2023-08-12 ENCOUNTER — Telehealth: Payer: Self-pay

## 2023-08-12 DIAGNOSIS — M79674 Pain in right toe(s): Secondary | ICD-10-CM

## 2023-08-12 DIAGNOSIS — B351 Tinea unguium: Secondary | ICD-10-CM

## 2023-08-12 DIAGNOSIS — L84 Corns and callosities: Secondary | ICD-10-CM | POA: Diagnosis not present

## 2023-08-12 DIAGNOSIS — E119 Type 2 diabetes mellitus without complications: Secondary | ICD-10-CM

## 2023-08-12 DIAGNOSIS — M79675 Pain in left toe(s): Secondary | ICD-10-CM

## 2023-08-12 NOTE — Progress Notes (Signed)
  Subjective:  Patient ID: Randy Hendrix, male    DOB: 02-29-1965,  MRN: [id number]  58 y.o. male presents preventative diabetic foot care and painful thick toenails that are difficult to trim. Pain interferes with ambulation. Aggravating factors include wearing enclosed shoe gear. Pain is relieved with periodic professional debridement.  Chief Complaint  Patient presents with   Diabetes    Check up and routine foot care.  Saw Dr. Almarie Cleveland - 07/18/2023; A1c - 5.6   New problem(s): None   PCP is [name], MD No Known Allergies  Review of Systems: Negative except as noted in the HPI.   Objective:  Randy Hendrix is a pleasant 58 y.o. male morbidly obese in NAD. AAO x 3.  Vascular Examination: Vascular status intact b/l with palpable pedal pulses. CFT immediate b/l. Pedal hair present. No edema. No pain with calf compression b/l. Skin temperature gradient WNL b/l. No varicosities noted. No cyanosis or clubbing noted.  Neurological Examination: Sensation grossly intact b/l with 10 gram monofilament. Vibratory sensation intact b/l.  Dermatological Examination: Pedal skin with normal turgor, texture and tone b/l. No open wounds nor interdigital macerations noted. Toenails 1-5 b/l thick, discolored, elongated with subungual debris and pain on dorsal palpation.   Hyperkeratotic lesion(s) submet head 5 left foot and submet head 5 right foot.  No erythema, no edema, no drainage, no fluctuance.  Musculoskeletal Examination: Muscle strength 5/5 to b/l LE.  No pain, crepitus noted b/l. Pes planus b/l. Patient ambulates independently without assistive aids.   Radiographs: None.  Last A1c:      Latest Ref Rng & Units 07/18/2023    9:54 AM 04/14/2023    8:30 AM 11/01/2022   10:27 AM  Hemoglobin A1C  Hemoglobin-A1c 4.0 - 5.6 % 5.6  12.4  7.3     Assessment:   1. Pain due to onychomycosis of toenails of both feet   2. Callus   3. Type 2 diabetes  mellitus without complication, without long-term current use of insulin  (HCC)     Plan:  Consent given for treatment. Patient examined. All patient's and/or POA's questions/concerns addressed on today's visit. Mycotic toenails 1-5 debrided in length and girth without incident. Callus(es) submet head 5 b/l pared with sharp debridement without incident. Continue foot and shoe inspections daily. Monitor blood glucose per PCP/Endocrinologist's recommendations.Continue soft, supportive shoe gear daily. Report any pedal injuries to medical professional. Call office if there are any quesitons/concerns. Return in about 10 weeks (around 10/21/2023).  Randy Hendrix, DPM      [facility] LOCATION: [address], [place] [address]                   Office [phone]   [facility] LOCATION: [address] [place], [place] [address] Office [phone]

## 2023-08-12 NOTE — Telephone Encounter (Signed)
 Copied from CRM [id number]. Topic: Clinical - Order For Equipment >> Aug 11, 2023  4:45 PM Celestine FALCON wrote: Reason for CRM: Pt's spouse is calling back because with his last visit with Dr. Neysa she was told to change DME to continue CPAP therapy. [facility]- replace old CPAP machine, auto 10-20, mask of choice, humidifier, supplies, AirView/ card.  [facility] is getting out of CPAP business and patient and wife live in [place].  Pt spouse stated she was not called back regarding this request. Please call the pt spouse back with options for DME in [place] at [phone] ok to leave a vm.      [facility] Team, please advise.  Order was placed on 06/26/2023 to get new DME company and new CPAP.  Thank you.

## 2023-08-13 NOTE — Telephone Encounter (Signed)
 Order has been sent as urgent to [facility]

## 2023-08-13 NOTE — Telephone Encounter (Signed)
 LVM for spouse informing her of where order has been sent. NFN

## 2023-08-14 ENCOUNTER — Ambulatory Visit: Attending: Internal Medicine | Admitting: Physical Therapy

## 2023-08-14 DIAGNOSIS — G8929 Other chronic pain: Secondary | ICD-10-CM | POA: Insufficient documentation

## 2023-08-14 DIAGNOSIS — M25561 Pain in right knee: Secondary | ICD-10-CM | POA: Insufficient documentation

## 2023-08-14 DIAGNOSIS — R2681 Unsteadiness on feet: Secondary | ICD-10-CM | POA: Diagnosis present

## 2023-08-14 DIAGNOSIS — M25572 Pain in left ankle and joints of left foot: Secondary | ICD-10-CM | POA: Insufficient documentation

## 2023-08-14 DIAGNOSIS — R42 Dizziness and giddiness: Secondary | ICD-10-CM | POA: Diagnosis present

## 2023-08-14 NOTE — Therapy (Signed)
 OUTPATIENT PHYSICAL THERAPY NEURO EVALUATION   Patient Name: Claudia Alvizo MRN: [id number] DOB:[birth year], 58 y.o., male Today's Date: 08/15/2023   PCP: Rollene Almarie LABOR, MD REFERRING PROVIDER: Rollene Almarie LABOR, MD  END OF SESSION:  PT End of Session - 08/15/23 1546     Visit Number 1    Number of Visits 5   4 visits + eval   Authorization Type UHC    Authorization - Visit Number 1    Authorization - Number of Visits 60    PT Start Time 1016    PT Stop Time 1100    PT Time Calculation (min) 44 min    Activity Tolerance Patient tolerated treatment well    Behavior During Therapy WFL for tasks assessed/performed          Past Medical History:  Diagnosis Date   Allergic rhinitis    ALLERGIC RHINITIS    Allergy    Anxiety    Gastroenteritis    GERD (gastroesophageal reflux disease)    occasionally   Gout    Hemorrhoid    Kidney stone on right side 05/2004   OBESITY, CLASS II    OSA (obstructive sleep apnea)    CPAP qhs   Oxygen deficiency    PATELLAR DISLOCATION, RIGHT    Sleep apnea    wears cpap   Past Surgical History:  Procedure Laterality Date   HEMORRHOID SURGERY     WISDOM TOOTH EXTRACTION     Patient Active Problem List   Diagnosis Date Noted   Chronic pain of right knee 07/18/2023   Chronic pain of left ankle 07/18/2023   Vertigo 07/18/2023   PTSD (post-traumatic stress disorder) 05/14/2022   Cough present for greater than 3 weeks 08/29/2021   Diarrhea 05/30/2021   Dyspnea on exertion 09/26/2020   Lumbar radiculopathy 12/07/2019   Essential hypertension 08/09/2019   Diabetes mellitus (HCC) 07/07/2018   Sleep disorder, 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   Gout 12/15/2014   Routine health maintenance 07/28/2011   Severe obesity (BMI >= 40) (HCC) 06/21/2008   OSA (obstructive sleep apnea) 04/28/2007    ONSET DATE: Referral date 07-18-23  REFERRING DIAG: M25.561,G89.29 (ICD-10-CM) - Chronic pain of right knee M25.572,G89.29 (ICD-10-CM) -  Chronic pain of left ankle R42 (ICD-10-CM) - Dizziness and giddiness  THERAPY DIAG:  Dizziness and giddiness  Unsteadiness on feet  Rationale for Evaluation and Treatment: Rehabilitation  SUBJECTIVE:  SUBJECTIVE STATEMENT: Pt says PCP and wife ganged up on me and told me I needed PT - says vertigo started initially since about 1992; denies nausea and vomiting - says he felt his bed spun about 4 months; pt states he has learned to move slowly from supine to sitting; pt says the vertigo is intermittent in occurrence Pt accompanied by: self  PERTINENT HISTORY: per chart note -- The patient is a 58 YO man coming in for medical management (see A/P for details). Worsening right knee, left ankle, lower back pain and vertigo.  PAIN:  Are you having pain? Yes: NPRS scale: 3/10 for low back - stays at 3/10 Pain location: low back, Rt knee, Lt ankle - due to OA - says not severe enough for TKA but may need in future Pain description: discomfort, sore Aggravating factors: weight bearing Relieving factors: pain medication - takes only as needed Pt reports his Lt ankle pain is due to the weight shift onto LLE in compensation for the Rt knee pain  PRECAUTIONS: None  RED FLAGS: None   WEIGHT BEARING RESTRICTIONS: No  FALLS: Has patient fallen in last 6 months? No  LIVING ENVIRONMENT: Lives with: lives with their spouse Lives in: House/apartment Stairs: Yes: Internal: 12 steps; on right going up and External: 1 steps; none Has following equipment at home: None  PLOF: Independent  PATIENT GOALS: try to function as normally as I can   OBJECTIVE:  Note: Objective measures were completed at Evaluation unless otherwise noted.  DIAGNOSTIC FINDINGS: N/A  COGNITION: Overall cognitive status: Within  functional limits for tasks assessed   SENSATION: WFL  COORDINATION: WNL's bil. LE's  POSTURE: No Significant postural limitations  LOWER EXTREMITY ROM:  WNL's bil. LE's    LOWER EXTREMITY MMT: WNL's bil LE's    BED MOBILITY:  Not tested  TRANSFERS: Sit to stand: Complete Independence  Assistive device utilized: None      RAMP:  Not tested  CURB:  Not tested  STAIRS:  pt reports no problem with step negotiation Not tested GAIT: Findings: Gait Characteristics: WFL, Distance walked: 50', Assistive device utilized:None, Level of assistance: Complete Independence, and Comments: slightly antalgic gait pattern due to Rt knee pain and Lt ankle pain  FUNCTIONAL TESTS:  MCTSIB: Condition 1: Avg of 3 trials: 30 sec, Condition 2: Avg of 3 trials: 30 sec, Condition 3: Avg of 3 trials: 30 sec, Condition 4: Avg of 3 trials: 19 sec, and Total Score: 109/120 Positional testing = Rt and Lt sidelying tests and Dix-Hallpike tests (-) with no nystagmus and no c/o vertigo SVA - line 10:  DVA line 8 - pt reported dizziness upon completion of test                                                                                                                             TREATMENT DATE: 08-14-23  HEP: see below   Access Code: [email or web address] URL: [email or web address] Date: 08/15/2023  Prepared by: Rock Kussmaul  Exercises - Standing Balance with Eyes OPEN, then CLOSED on Foam  - 1 x daily - 7 x weekly - 1 sets - 2 reps - 30 sec  hold - gaze stabilization exercise in standing - 30 secs - 3-5x daily- 30 secs each rep;  1 set - 3-5x/day; both horizontal and vertical directions   PATIENT EDUCATION: Education details: Medbridge HEP [phone] Person educated: Patient Education method: Explanation, Demonstration, and Handouts Education comprehension: verbalized understanding and returned demonstration  HOME EXERCISE PROGRAM: Medbridge  GOALS: Goals reviewed with patient? Yes  SHORT  TERM GOALS: same as LTG's as ELOS = 4 weeks  LONG TERM GOALS: Target date: 09-19-23 (pushed out 1 week due to schedule availability)  Pt will stand for 30 secs on condition 4 of mCTSIB with minimal to no postural sway and no c/o dizziness to demo increased vestibular input in maintaining balance. Baseline: 13 secs 1st trial with moderate postural sway Goal status: INITIAL  2.  Pt will perform x1 viewing exercise in standing (perform both horizontal and vertical head turns) for 60 secs with </= minimal c/o dizziness for improved gaze stabilization.   Baseline:  Goal status: INITIAL  3.  Pt will subjectively report at least 25% improvement in Rt knee and Lt ankle pain to increase ease and tolerance with ADL's & work activities.  Baseline:  Goal status: INITIAL  4.  Independent in HEP for vestibular/balance exercises and habituation exercises as needed. Baseline:  Goal status: INITIAL  ASSESSMENT:  CLINICAL IMPRESSION: Patient is a 58 y.o. gentleman who was seen today for physical therapy evaluation and treatment for dizziness and c/o chronic Rt knee and Lt ankle pain.  Etiology of dizziness is unknown - all positional testing is negative at this time with no nystagmus noted with any movements/positions.  Oculomotor exam is WNL's with pt reporting mild dizziness provoked with smooth pursuit testing with both horizontal and vertical movements.  DVA is WNL's with a 2 line difference from SVA, however, pt reported dizziness upon completion of test.  Pt does demonstrate mild decrease in vestibular input in maintaining balance as evidenced by pt's ability to stand for approx. 9 secs on 1st trial condition 4 of mCTSIB, then 30 secs on 2nd trial with moderate postural sway but no LOB.  Pt will benefit from PT to address dizziness, imbalance, and chronic Rt knee & Lt ankle pain.   OBJECTIVE IMPAIRMENTS: decreased balance, difficulty walking, dizziness, obesity, and pain.   ACTIVITY LIMITATIONS:  bending, standing, squatting, and locomotion level  PARTICIPATION LIMITATIONS: community activity and yard work  PERSONAL FACTORS: Fitness, Past/current experiences, Time since onset of injury/illness/exacerbation, and 1 comorbidity: Rt knee OA and obesity are also affecting patient's functional outcome.   REHAB POTENTIAL: Good  CLINICAL DECISION MAKING: Evolving/moderate complexity  EVALUATION COMPLEXITY: Moderate  PLAN:  PT FREQUENCY: 1x/week  PT DURATION: 4 weeks +eval  PLANNED INTERVENTIONS: 97110-Therapeutic exercises, 97530- Therapeutic activity, [id number]- Neuromuscular re-education, [phone]- Self Care, [id number]- Gait training, [phone]- Canalith repositioning, and [phone]- Aquatic Therapy  PLAN FOR NEXT SESSION: check HEP, do SOT; give info on BPPV - unsure if pt has had this in the past   Sherece Gambrill Suzanne, PT 08/15/2023, 3:50 PM

## 2023-08-15 ENCOUNTER — Encounter: Payer: Self-pay | Admitting: Physical Therapy

## 2023-08-21 ENCOUNTER — Ambulatory Visit: Payer: Self-pay | Attending: Internal Medicine | Admitting: Physical Therapy

## 2023-08-21 DIAGNOSIS — R2681 Unsteadiness on feet: Secondary | ICD-10-CM | POA: Diagnosis present

## 2023-08-21 DIAGNOSIS — R42 Dizziness and giddiness: Secondary | ICD-10-CM | POA: Diagnosis present

## 2023-08-21 NOTE — Patient Instructions (Addendum)
 How to Perform the Epley Maneuver The Epley maneuver is an exercise that relieves symptoms of vertigo. Vertigo is the feeling that you or your surroundings are moving when they are not. When you feel vertigo, you may feel like the room is spinning and may have trouble walking. The Epley maneuver is used for a type of vertigo caused by a calcium deposit in a part of the inner ear. The maneuver involves changing head positions to help the deposit move out of the area. You can do this maneuver at home whenever you have symptoms of vertigo. You can repeat it in 24 hours if your vertigo has not gone away. Even though the Epley maneuver may relieve your vertigo for a few weeks, it is possible that your symptoms will return. This maneuver relieves vertigo, but it does not relieve dizziness. What are the risks? If it is done correctly, the Epley maneuver is considered safe. Sometimes it can lead to dizziness or nausea that goes away after a short time. If you develop other symptoms--such as changes in vision, weakness, or numbness--stop doing the maneuver and call your health care provider. Supplies needed: A bed or table. A pillow. How to do the Epley maneuver     Sit on the edge of a bed or table with your back straight and your legs extended or hanging over the edge of the bed or table. Turn your head halfway toward the affected ear or side as told by your health care provider. Lie backward quickly with your head turned until you are lying flat on your back. Your head should dangle (head-hanging position). You may want to position a pillow under your shoulders. Hold this position for at least 30 seconds. If you feel dizzy or have symptoms of vertigo, continue to hold the position until the symptoms stop. Turn your head to the opposite direction until your unaffected ear is facing down. Your head should continue to dangle. Hold this position for at least 30 seconds. If you feel dizzy or have  symptoms of vertigo, continue to hold the position until the symptoms stop. Turn your whole body to the same side as your head so that you are positioned on your side. Your head will now be nearly facedown and no longer needs to dangle. Hold for at least 30 seconds. If you feel dizzy or have symptoms of vertigo, continue to hold the position until the symptoms stop. Sit back up. You can repeat the maneuver in 24 hours if your vertigo does not go away. Follow these instructions at home: For 24 hours after doing the Epley maneuver: Keep your head in an upright position. When lying down to sleep or rest, keep your head raised (elevated) with two or more pillows. Avoid excessive neck movements. Activity Do not drive or use machinery if you feel dizzy. After doing the Epley maneuver, return to your normal activities as told by your health care provider. Ask your health care provider what activities are safe for you. General instructions Drink enough fluid to keep your urine pale yellow. Do not drink alcohol. Take over-the-counter and prescription medicines only as told by your health care provider. Keep all follow-up visits. This is important. Preventing vertigo symptoms Ask your health care provider if there is anything you should do at home to prevent vertigo. He or she may recommend that you: Keep your head elevated with two or more pillows while you sleep. Do not sleep on the side of your  affected ear. Get up slowly from bed. Avoid sudden movements during the day. Avoid extreme head positions or movement, such as looking up or bending over. Contact a health care provider if: Your vertigo gets worse. You have other symptoms, including: Nausea. Vomiting. Headache. Get help right away if you: Have vision changes. Have a headache or neck pain that is severe or getting worse. Cannot stop vomiting. Have new numbness or weakness in any part of your body. These symptoms may represent a  serious problem that is an emergency. Do not wait to see if the symptoms will go away. Get medical help right away. Call your local emergency services (911 in the [place] Do not drive yourself to the hospital. Summary Vertigo is the feeling that you or your surroundings are moving when they are not. The Epley maneuver is an exercise that relieves symptoms of vertigo. If the Epley maneuver is done correctly, it is considered safe. This information is not intended to replace advice given to you by your health care provider. Make sure you discuss any questions you have with your health care provider. Document Revised: 11/01/2022 Document Reviewed: 11/01/2022 Elsevier Patient Education  2024 Elsevier Inc.  Self Treatment for Left Posterior / Anterior Canalithiasis    Sitting on bed: 1. Turn head 45 left. (a) Lie back slowly, shoulders on pillow, head on bed. (b) Hold _20___ seconds. 2. Keeping head on bed, turn head 90 right. Hold _20___ seconds. 3. Roll to right, head on 45 angle down toward bed. Hold _20___ seconds. 4. Sit up on right side of bed. Repeat __3__ times per session. Do __2__ sessions per day.  Copyright  VHI. All rights reserved.

## 2023-08-21 NOTE — Therapy (Signed)
 OUTPATIENT PHYSICAL THERAPY NEURO TREATMENT NOTE   Patient Name: Randy Hendrix MRN: [id number] DOB:21-Oct-1965, 58 y.o., male Today's Date: 08/24/2023   PCP: Rollene Almarie LABOR, MD REFERRING PROVIDER: Rollene Almarie LABOR, MD  END OF SESSION:  PT End of Session - 08/24/23 1317     Visit Number 2    Number of Visits 5   4 visits + eval   Date for PT Re-Evaluation 09/19/23    Authorization Type UHC    Authorization - Visit Number 2    Authorization - Number of Visits 60    PT Start Time 0914    PT Stop Time 1005    PT Time Calculation (min) 51 min    Equipment Utilized During Treatment Other (comment)   harness vest used with SOT   Activity Tolerance Patient tolerated treatment well    Behavior During Therapy WFL for tasks assessed/performed           Past Medical History:  Diagnosis Date   Allergic rhinitis    ALLERGIC RHINITIS    Allergy    Anxiety    Gastroenteritis    GERD (gastroesophageal reflux disease)    occasionally   Gout    Hemorrhoid    Kidney stone on right side 05/2004   OBESITY, CLASS II    OSA (obstructive sleep apnea)    CPAP qhs   Oxygen deficiency    PATELLAR DISLOCATION, RIGHT    Sleep apnea    wears cpap   Past Surgical History:  Procedure Laterality Date   HEMORRHOID SURGERY     WISDOM TOOTH EXTRACTION     Patient Active Problem List   Diagnosis Date Noted   Chronic pain of right knee 07/18/2023   Chronic pain of left ankle 07/18/2023   Vertigo 07/18/2023   PTSD (post-traumatic stress disorder) 05/14/2022   Cough present for greater than 3 weeks 08/29/2021   Diarrhea 05/30/2021   Dyspnea on exertion 09/26/2020   Lumbar radiculopathy 12/07/2019   Essential hypertension 08/09/2019   Diabetes mellitus (HCC) 07/07/2018   Sleep disorder, 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   Gout 12/15/2014   Routine health maintenance 07/28/2011   Severe obesity (BMI >= 40) (HCC) 06/21/2008   OSA (obstructive sleep apnea) 04/28/2007    ONSET  DATE: Referral date 07-18-23  REFERRING DIAG: M25.561,G89.29 (ICD-10-CM) - Chronic pain of right knee M25.572,G89.29 (ICD-10-CM) - Chronic pain of left ankle R42 (ICD-10-CM) - Dizziness and giddiness  THERAPY DIAG:  Dizziness and giddiness  Unsteadiness on feet  Rationale for Evaluation and Treatment: Rehabilitation  SUBJECTIVE:  SUBJECTIVE STATEMENT: Pt reports no major changes in status since eval last week; has done exercises at home some this week Pt accompanied by: self  PERTINENT HISTORY: per chart note -- The patient is a 58 YO man coming in for medical management (see A/P for details). Worsening right knee, left ankle, lower back pain and vertigo.  PAIN:  Are you having pain? Yes: NPRS scale: 3/10 for low back - stays at 3/10 Pain location: low back, Rt knee, Lt ankle - due to OA - says not severe enough for TKA but may need in future Pain description: discomfort, sore Aggravating factors: weight bearing Relieving factors: pain medication - takes only as needed Pt reports his Lt ankle pain is due to the weight shift onto LLE in compensation for the Rt knee pain  PRECAUTIONS: None  RED FLAGS: None   WEIGHT BEARING RESTRICTIONS: No  FALLS: Has patient fallen in last 6 months? No  LIVING ENVIRONMENT: Lives with: lives with their spouse Lives in: House/apartment Stairs: Yes: Internal: 12 steps; on right going up and External: 1 steps; none Has following equipment at home: None  PLOF: Independent  PATIENT GOALS: try to function as normally as I can   OBJECTIVE:  Note: Objective measures were completed at Evaluation unless otherwise noted.  DIAGNOSTIC FINDINGS: N/A  COGNITION: Overall cognitive status: Within functional limits for tasks  assessed   SENSATION: WFL  COORDINATION: WNL's bil. LE's  POSTURE: No Significant postural limitations  LOWER EXTREMITY ROM:  WNL's bil. LE's    LOWER EXTREMITY MMT: WNL's bil LE's    BED MOBILITY:  Not tested  TRANSFERS: Sit to stand: Complete Independence  Assistive device utilized: None      RAMP:  Not tested  CURB:  Not tested  STAIRS:  pt reports no problem with step negotiation Not tested GAIT: Findings: Gait Characteristics: WFL, Distance walked: 50', Assistive device utilized:None, Level of assistance: Complete Independence, and Comments: slightly antalgic gait pattern due to Rt knee pain and Lt ankle pain  FUNCTIONAL TESTS:  MCTSIB: Condition 1: Avg of 3 trials: 30 sec, Condition 2: Avg of 3 trials: 30 sec, Condition 3: Avg of 3 trials: 30 sec, Condition 4: Avg of 3 trials: 19 sec, and Total Score: 109/120 Positional testing = Rt and Lt sidelying tests and Dix-Hallpike tests (-) with no nystagmus and no c/o vertigo SVA - line 10:  DVA line 8 - pt reported dizziness upon completion of test                                                                                                                             TREATMENT DATE: 08-21-23  NeuroRe-ed:   Sensory Organization Test:  composite score 54/100:  N= 70/100  Somatosensory and visual inputs WNL's:  Vestibular minimal to none (score approx. 1/100 with N=55/100)  Preference WNL's COG - biased to anterior left - pt reports this is due to his Rt knee pain, which  has occurred due to Lt ankle pain/issue  Condition 1 - trials 1 & 3 WNL's:  trial 2 slightly decreased Condition 2 - all 3 trials below normal Condition 3 - trial 1 below normal:  trials 2 and 3 WNL's Condition 4 - all 3 trials WNL's Condition 5 - FALL on all 3 trials; Condition 6 - FALL on trials 1 & 3:  trial 2 WNL's  Rt and Lt sidelying tests - both negative with no nystagmus and no c/o vertigo in either test position;  pt did report dizziness  with return to upright BP recorded - 124/78  (to rule out high BP contributing to dizziness with return to upright)   Self Care:   Educated pt in results of SOT and explained that there is a vestibular hypofunction based on test results Reviewed HEP as instructed in previous eval session;  educated pt on habituation concept and need to do movement /activity which provoked symptoms up to 5 reps 1-2x/daily  Gave pt article on BPPV etiology from [facility] as pt may have had BPPV episode in past but no signs of BPPV at current time   HEP: see below   Access Code: [email or web address] URL: [email or web address] Date: 08/15/2023 Prepared by: Rock Kussmaul  Exercises - Standing Balance with Eyes OPEN, then CLOSED on Foam  - 1 x daily - 7 x weekly - 1 sets - 2 reps - 30 sec  hold - gaze stabilization exercise in standing - 30 secs - 3-5x daily- 30 secs each rep;  1 set - 3-5x/day; both horizontal and vertical directions   PATIENT EDUCATION: Education details: Medbridge HEP [phone] Person educated: Patient Education method: Explanation, Demonstration, and Handouts Education comprehension: verbalized understanding and returned demonstration  HOME EXERCISE PROGRAM: Medbridge  GOALS: Goals reviewed with patient? Yes  SHORT TERM GOALS: same as LTG's as ELOS = 4 weeks  LONG TERM GOALS: Target date: 09-19-23 (pushed out 1 week due to schedule availability)  Pt will stand for 30 secs on condition 4 of mCTSIB with minimal to no postural sway and no c/o dizziness to demo increased vestibular input in maintaining balance. Baseline: 13 secs 1st trial with moderate postural sway Goal status: INITIAL  2.  Pt will perform x1 viewing exercise in standing (perform both horizontal and vertical head turns) for 60 secs with </= minimal c/o dizziness for improved gaze stabilization.   Baseline:  Goal status: INITIAL  3.  Pt will subjectively report at least 25% improvement in Rt knee and Lt ankle pain to  increase ease and tolerance with ADL's & work activities.  Baseline:  Goal status: INITIAL  4.  Independent in HEP for vestibular/balance exercises and habituation exercises as needed. Baseline:  Goal status: INITIAL  ASSESSMENT:  CLINICAL IMPRESSION: PT session focused on further assessment of vestibular function with SOT administered.  Pt's composite score of 54/100 is below normal of 70/100 for his age-related norm.  Pt's somatosensory and visual inputs are WNL's with vestibular input being minimal to none per SOT score; this is indicative of a vestibular hypofunction.  Pt reported moderate dizziness upon stepping off of balance master upon completion of SOT with need for seated rest period to allow dizziness to subside.  Pt verbalized understanding of explanation of test scores with interpretation consistent with vestibular hypofunction.  Cont with POC.  OBJECTIVE IMPAIRMENTS: decreased balance, difficulty walking, dizziness, obesity, and pain.   ACTIVITY LIMITATIONS: bending, standing, squatting, and locomotion level  PARTICIPATION LIMITATIONS: community activity and yard work  PERSONAL FACTORS: Fitness, Past/current experiences, Time since onset of injury/illness/exacerbation, and 1 comorbidity: Rt knee OA and obesity are also affecting patient's functional outcome.   REHAB POTENTIAL: Good  CLINICAL DECISION MAKING: Evolving/moderate complexity  EVALUATION COMPLEXITY: Moderate  PLAN:  PT FREQUENCY: 1x/week  PT DURATION: 4 weeks +eval  PLANNED INTERVENTIONS: 97110-Therapeutic exercises, 97530- Therapeutic activity, [id number]- Neuromuscular re-education, 97535- Self Care, 02883- Gait training, [phone]- Canalith repositioning, and [phone]- Aquatic Therapy  PLAN FOR NEXT SESSION: how did habituation exercises go?  Balance on foam, vestibular exercises   Lilyian Quayle, [place], PT 08/24/2023, 1:23 PM

## 2023-08-24 ENCOUNTER — Encounter: Payer: Self-pay | Admitting: Physical Therapy

## 2023-08-29 ENCOUNTER — Ambulatory Visit (INDEPENDENT_AMBULATORY_CARE_PROVIDER_SITE_OTHER): Admitting: Behavioral Health

## 2023-08-29 ENCOUNTER — Encounter: Payer: Self-pay | Admitting: Behavioral Health

## 2023-08-29 DIAGNOSIS — R454 Irritability and anger: Secondary | ICD-10-CM | POA: Diagnosis not present

## 2023-08-29 DIAGNOSIS — F411 Generalized anxiety disorder: Secondary | ICD-10-CM

## 2023-08-29 NOTE — Progress Notes (Signed)
 [facility] Counselor/Therapist Progress Note  Patient ID: Jiovani Mccammon, MRN: [id number],    Date: 08/29/2023  Time Spent: 56 minutes, 11:00 until 11:56 AM this session was held via video teletherapy. The patient consented to the video teletherapy and was located in his car during this session. He is aware it is the responsibility of the patient to secure confidentiality on his end of the session. The provider was in a private home office for the duration of this session.      Treatment Type: Individual Therapy  Reported Symptoms: Anxiety/irritability  Mental Status Exam: Appearance:  Fairly Groomed     Behavior: Appropriate  Motor: Normal  Speech/Language:  Normal Rate  Affect: Appropriate  Mood: normal  Thought process: normal  Thought content:   WNL  Sensory/Perceptual disturbances:   WNL  Orientation: oriented to person, place, time/date, situation, day of week, month of year, and year  Attention: Good  Concentration: Good  Memory: WNL  Fund of knowledge:  Good  Insight:   Good  Judgment:  Good  Impulse Control: Good   Risk Assessment: Danger to Self:  No Self-injurious Behavior: No Danger to Others: No Duty to Warn:no Physical Aggression / Violence:No  Access to Firearms a concern: No  Gang Involvement:No   Subjective: The patient's wife is doing better in her recovery from her surgery but is up to speed.  They found out recently that his daughter will have to have additional surgery on her foot for stability and that is scheduled for sometime in December.  He acknowledges that brings a different level of stress to the family.  He is taking physical therapy and has been somewhat beneficial.  Work is still somewhat stressful.  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  His son is also very busy and will help when he can but he does not  want to have to count on him.  We talked about trying if make sure the patient paces himself.  He acknowledges that the last couple weeks he has been a bit more irritable and understands the reason behind it.  I encouraged him to try to find a few minutes for self-care including mowing the lawn which is therapeutic for him.  He knows that his spillover into how that affects communication between his wife so we talked about ways that they can do that differently to reduce that stress level. Interventions: Cognitive Behavioral Therapy and Dialectical Behavioral Therapy  Diagnosis: Generalized anxiety disorder, irritability  Plan: I will meet with the patient virtually every 2 to 3 weeks.  Reviewed treatment plan on November 1 session. Target date December 19, 2022.  Progress: 40% Goals will be to improve the patient's ability to manage anxiety and stress in a healthier way, identify causes for anxiety and explore ways to lower them, resolve any core conflicts contributing to anxiety and help the patient manage worrisome thoughts and thinking's contributing to stress and anxiety.  Interventions will include providing education about anxiety and stress to help him identify its causes, facilitate problem solution skills as well as teach coping skills to manage anxiety symptoms such as grounding exercises, progressive muscle relaxation etc.  We will also use cognitive behavioral therapy to identify and change anxiety provoking thought and behavior patterns as well as use dialectical behavior therapy distress tolerance and mindfulness skills to help him  learn better anxiety management skills.  I reviewed the goals for the patient and he would like to continue as goals stated above.  New target date is December 19, 2023. Lorrene CHRISTELLA Hasten, [facility]                Lorrene CHRISTELLA Hasten, [facility]               Lorrene CHRISTELLA Hasten, [facility]               Lorrene CHRISTELLA Hasten,  [facility]               Lorrene CHRISTELLA Hasten, [facility]               Lorrene CHRISTELLA Hasten, [facility]               Lorrene CHRISTELLA Hasten, [facility]               Lorrene CHRISTELLA Hasten, [facility]               Lorrene CHRISTELLA Hasten, [facility]               Lorrene CHRISTELLA Hasten, [facility]               Lorrene CHRISTELLA Hasten, [facility]               Lorrene CHRISTELLA Hasten, [facility]           Lorrene CHRISTELLA Hasten, [facility]               Lorrene CHRISTELLA Hasten, [facility]               Lorrene CHRISTELLA Hasten, [facility]               Lorrene CHRISTELLA Hasten, [facility]               Lorrene CHRISTELLA Hasten, [facility]               Lorrene CHRISTELLA Hasten, [facility]               Lorrene CHRISTELLA Hasten, [facility]               Lorrene CHRISTELLA Hasten, [facility]

## 2023-09-04 ENCOUNTER — Encounter: Payer: Self-pay | Admitting: Physical Therapy

## 2023-09-04 ENCOUNTER — Ambulatory Visit: Payer: Self-pay | Admitting: Physical Therapy

## 2023-09-04 DIAGNOSIS — R2681 Unsteadiness on feet: Secondary | ICD-10-CM

## 2023-09-04 DIAGNOSIS — R42 Dizziness and giddiness: Secondary | ICD-10-CM

## 2023-09-04 NOTE — Therapy (Signed)
 OUTPATIENT PHYSICAL THERAPY NEURO TREATMENT NOTE   Patient Name: Randy Hendrix MRN: [id number] DOB:04/25/1965, 58 y.o., male Today's Date: 09/06/2023   PCP: Rollene Almarie LABOR, MD REFERRING PROVIDER: Rollene Almarie LABOR, MD  END OF SESSION:  PT End of Session - 09/06/23 1753     Visit Number 3    Number of Visits 5   4 visits + eval   Date for PT Re-Evaluation 09/19/23    Authorization Type U[employer]    Authorization - Visit Number 3    Authorization - Number of Visits 60    PT Start Time 1450    PT Stop Time 1532    PT Time Calculation (min) 42 min    Equipment Utilized During Treatment Gait belt    Activity Tolerance Patient tolerated treatment well    Behavior During Therapy WFL for tasks assessed/performed            Past Medical History:  Diagnosis Date   Allergic rhinitis    ALLERGIC RHINITIS    Allergy    Anxiety    Gastroenteritis    GERD (gastroesophageal reflux disease)    occasionally   Gout    Hemorrhoid    Kidney stone on right side 05/2004   OBESITY, CLASS II    OSA (obstructive sleep apnea)    CPAP qhs   Oxygen deficiency    PATELLAR DISLOCATION, RIGHT    Sleep apnea    wears cpap   Past Surgical History:  Procedure Laterality Date   HEMORRHOID SURGERY     WISDOM TOOTH EXTRACTION     Patient Active Problem List   Diagnosis Date Noted   Chronic pain of right knee 07/18/2023   Chronic pain of left ankle 07/18/2023   Vertigo 07/18/2023   PTSD (post-traumatic stress disorder) 05/14/2022   Cough present for greater than 3 weeks 08/29/2021   Diarrhea 05/30/2021   Dyspnea on exertion 09/26/2020   Lumbar radiculopathy 12/07/2019   Essential hypertension 08/09/2019   Diabetes mellitus (HCC) 07/07/2018   Sleep disorder, 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   Gout 12/15/2014   Routine health maintenance 07/28/2011   Severe obesity (BMI >= 40) (HCC) 06/21/2008   OSA (obstructive sleep apnea) 04/28/2007    ONSET DATE: Referral date  07-18-23  REFERRING DIAG: M25.561,G89.29 (ICD-10-CM) - Chronic pain of right knee M25.572,G89.29 (ICD-10-CM) - Chronic pain of left ankle R42 (ICD-10-CM) - Dizziness and giddiness  THERAPY DIAG:  Dizziness and giddiness  Unsteadiness on feet  Rationale for Evaluation and Treatment: Rehabilitation  SUBJECTIVE:  SUBJECTIVE STATEMENT: Pt reports he is doing OK - no major changes; feels out of control with doing some of the exercises and does not like feeling this way.  Says exercises are going OK overall Pt accompanied by: self  PERTINENT HISTORY: per chart note -- The patient is a 58 YO man coming in for medical management (see A/P for details). Worsening right knee, left ankle, lower back pain and vertigo.  PAIN:  Are you having pain? Yes: NPRS scale: 3/10 for low back - stays at 3/10 Pain location: low back, Rt knee, Lt ankle - due to OA - says not severe enough for TKA but may need in future Pain description: discomfort, sore Aggravating factors: weight bearing Relieving factors: pain medication - takes only as needed Pt reports his Lt ankle pain is due to the weight shift onto LLE in compensation for the Rt knee pain  PRECAUTIONS: None  RED FLAGS: None   WEIGHT BEARING RESTRICTIONS: No  FALLS: Has patient fallen in last 6 months? No  LIVING ENVIRONMENT: Lives with: lives with their spouse Lives in: House/apartment Stairs: Yes: Internal: 12 steps; on right going up and External: 1 steps; none Has following equipment at home: None  PLOF: Independent  PATIENT GOALS: try to function as normally as I can   OBJECTIVE:  Note: Objective measures were completed at Evaluation unless otherwise noted.  DIAGNOSTIC FINDINGS: N/A  COGNITION: Overall cognitive status: Within functional  limits for tasks assessed   SENSATION: WFL  COORDINATION: WNL's bil. LE's  POSTURE: No Significant postural limitations  LOWER EXTREMITY ROM:  WNL's bil. LE's    LOWER EXTREMITY MMT: WNL's bil LE's    BED MOBILITY:  Not tested  TRANSFERS: Sit to stand: Complete Independence  Assistive device utilized: None      RAMP:  Not tested  CURB:  Not tested  STAIRS:  pt reports no problem with step negotiation Not tested GAIT: Findings: Gait Characteristics: WFL, Distance walked: 50', Assistive device utilized:None, Level of assistance: Complete Independence, and Comments: slightly antalgic gait pattern due to Rt knee pain and Lt ankle pain  FUNCTIONAL TESTS:  MCTSIB: Condition 1: Avg of 3 trials: 30 sec, Condition 2: Avg of 3 trials: 30 sec, Condition 3: Avg of 3 trials: 30 sec, Condition 4: Avg of 3 trials: 19 sec, and Total Score: 109/120 Positional testing = Rt and Lt sidelying tests and Dix-Hallpike tests (-) with no nystagmus and no c/o vertigo SVA - line 10:  DVA line 8 - pt reported dizziness upon completion of test                                                                                                                             TREATMENT DATE: 09-04-23  NeuroRe-ed:    Standing Balance: Surface: Airex Position: Narrow Base of Support Feet Hip Width Apart Completed with: Eyes Open and Eyes Closed; Head Turns x 5 Reps and Head Nods x 5 Reps  Marching on Airex - EO - no head turns 5 reps each leg; with EC 5 reps; EO - horizontal head turns 5 reps:  vertical head turns EO 5 reps;   Pt performed stepping down to floor with RLE - with head turn to left side 5 reps;  then stepping down to floor with LLE - with head turn to Rt side 5 reps with CGA  TherAct:   Habituation - pt performed sit to Lt sidelying, return to upright sitting 5 reps:  sit to Rt sidelying, return to upright 5 reps;  pt reported dizziness improved with subsequent reps  Sit to stand from mat  with EC 5 reps - feet on floor - with CGA to SBA  Amb. 20' with horizontal head turns with CGA - mild to moderate unsteadiness - balance improved with slower speed      HEP: see below   Access Code: [email or web address] URL: [email or web address] Date: 08/15/2023 Prepared by: Rock Kussmaul  Exercises - Standing Balance with Eyes OPEN, then CLOSED on Foam  - 1 x daily - 7 x weekly - 1 sets - 2 reps - 30 sec  hold - gaze stabilization exercise in standing - 30 secs - 3-5x daily- 30 secs each rep;  1 set - 3-5x/day; both horizontal and vertical directions   PATIENT EDUCATION: Education details: Medbridge HEP [phone] Person educated: Patient Education method: Explanation, Demonstration, and Handouts Education comprehension: verbalized understanding and returned demonstration  HOME EXERCISE PROGRAM: [employer]  GOALS: Goals reviewed with patient? Yes  SHORT TERM GOALS: same as LTG's as ELOS = 4 weeks  LONG TERM GOALS: Target date: 09-19-23 (pushed out 1 week due to schedule availability)  Pt will stand for 30 secs on condition 4 of mCTSIB with minimal to no postural sway and no c/o dizziness to demo increased vestibular input in maintaining balance. Baseline: 13 secs 1st trial with moderate postural sway Goal status: INITIAL  2.  Pt will perform x1 viewing exercise in standing (perform both horizontal and vertical head turns) for 60 secs with </= minimal c/o dizziness for improved gaze stabilization.   Baseline:  Goal status: INITIAL  3.  Pt will subjectively report at least 25% improvement in Rt knee and Lt ankle pain to increase ease and tolerance with ADL's & work activities.  Baseline:  Goal status: INITIAL  4.  Independent in HEP for vestibular/balance exercises and habituation exercises as needed. Baseline:  Goal status: INITIAL  ASSESSMENT:  CLINICAL IMPRESSION: PT session focused on balance exercises with increased vestibular input incorporated.  Pt is most  challenged by vestibular/balance exercises with EC, standing on compliant surface (indicative of vestibular hypofunction).  Pt reported dizziness decreased with subsequent reps of habituation exercise, sit <> sidelying.  Pt is progressing well towards LTG's.  Cont with POC.  OBJECTIVE IMPAIRMENTS: decreased balance, difficulty walking, dizziness, obesity, and pain.   ACTIVITY LIMITATIONS: bending, standing, squatting, and locomotion level  PARTICIPATION LIMITATIONS: community activity and yard work  PERSONAL FACTORS: Fitness, Past/current experiences, Time since onset of injury/illness/exacerbation, and 1 comorbidity: Rt knee OA and obesity are also affecting patient's functional outcome.   REHAB POTENTIAL: Good  CLINICAL DECISION MAKING: Evolving/moderate complexity  EVALUATION COMPLEXITY: Moderate  PLAN:  PT FREQUENCY: 1x/week  PT DURATION: 4 weeks +eval  PLANNED INTERVENTIONS: 97110-Therapeutic exercises, 97530- Therapeutic activity, [id number]- Neuromuscular re-education, 97535- Self Care, 02883- Gait training, [phone]- Canalith repositioning, and [phone]- Aquatic Therapy  PLAN FOR NEXT SESSION: how did habituation exercises go?  Balance on foam, vestibular  exercises   Corley Maffeo, [place], PT 09/06/2023, 6:51 PM

## 2023-09-06 ENCOUNTER — Encounter: Payer: Self-pay | Admitting: Physical Therapy

## 2023-09-08 ENCOUNTER — Encounter: Payer: Self-pay | Admitting: Internal Medicine

## 2023-09-09 NOTE — Telephone Encounter (Signed)
**Note De-identified  [place] Obfuscation** Please advise 

## 2023-09-11 ENCOUNTER — Ambulatory Visit: Admitting: Behavioral Health

## 2023-09-16 NOTE — Telephone Encounter (Signed)
Patient is requesting physical therapy

## 2023-09-27 ENCOUNTER — Other Ambulatory Visit: Payer: Self-pay | Admitting: Internal Medicine

## 2023-09-30 ENCOUNTER — Ambulatory Visit: Payer: Self-pay | Attending: Internal Medicine | Admitting: Physical Therapy

## 2023-09-30 DIAGNOSIS — R42 Dizziness and giddiness: Secondary | ICD-10-CM | POA: Insufficient documentation

## 2023-09-30 DIAGNOSIS — R2681 Unsteadiness on feet: Secondary | ICD-10-CM | POA: Diagnosis present

## 2023-09-30 NOTE — Therapy (Signed)
 OUTPATIENT PHYSICAL THERAPY NEURO TREATMENT NOTE   Patient Name: Randy Hendrix MRN: [id number] DOB:[birth year], 58 y.o., male Today's Date: 10/01/2023   PCP: Rollene Almarie LABOR, MD REFERRING PROVIDER: Rollene Almarie LABOR, MD  END OF SESSION:  PT End of Session - 10/01/23 1529     Visit Number 4    Number of Visits 5   4 visits + eval   Date for PT Re-Evaluation 09/19/23    Authorization Type UHC    Authorization - Visit Number 4    Authorization - Number of Visits 60    PT Start Time 0850    PT Stop Time 0935    PT Time Calculation (min) 45 min    Equipment Utilized During Treatment --    Activity Tolerance Patient tolerated treatment well    Behavior During Therapy WFL for tasks assessed/performed             Past Medical History:  Diagnosis Date   Allergic rhinitis    ALLERGIC RHINITIS    Allergy    Anxiety    Gastroenteritis    GERD (gastroesophageal reflux disease)    occasionally   Gout    Hemorrhoid    Kidney stone on right side 05/2004   OBESITY, CLASS II    OSA (obstructive sleep apnea)    CPAP qhs   Oxygen deficiency    PATELLAR DISLOCATION, RIGHT    Sleep apnea    wears cpap   Past Surgical History:  Procedure Laterality Date   HEMORRHOID SURGERY     WISDOM TOOTH EXTRACTION     Patient Active Problem List   Diagnosis Date Noted   Chronic pain of right knee 07/18/2023   Chronic pain of left ankle 07/18/2023   Vertigo 07/18/2023   PTSD (post-traumatic stress disorder) 05/14/2022   Cough present for greater than 3 weeks 08/29/2021   Diarrhea 05/30/2021   Dyspnea on exertion 09/26/2020   Lumbar radiculopathy 12/07/2019   Essential hypertension 08/09/2019   Diabetes mellitus (HCC) 07/07/2018   Sleep disorder, 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   Gout 12/15/2014   Routine health maintenance 07/28/2011   Severe obesity (BMI >= 40) (HCC) 06/21/2008   OSA (obstructive sleep apnea) 04/28/2007    ONSET DATE: Referral date 07-18-23  REFERRING  DIAG: M25.561,G89.29 (ICD-10-CM) - Chronic pain of right knee M25.572,G89.29 (ICD-10-CM) - Chronic pain of left ankle R42 (ICD-10-CM) - Dizziness and giddiness  THERAPY DIAG:  Dizziness and giddiness  Unsteadiness on feet  Rationale for Evaluation and Treatment: Rehabilitation  SUBJECTIVE:  SUBJECTIVE STATEMENT: Pt reports he got dizzy at work a few days ago - says he was taking a lot of items off of a moving belt with another belt moving in a different direction - says the looking back and forth with the belt moving made him dizzy - had to take a break for a short while Pt accompanied by: self  PERTINENT HISTORY: per chart note -- The patient is a 58 YO man coming in for medical management (see A/P for details). Worsening right knee, left ankle, lower back pain and vertigo.  PAIN:  Are you having pain? Yes: NPRS scale: 3/10 for low back - stays at 3/10 Pain location: low back, Rt knee, Lt ankle - due to OA - says not severe enough for TKA but may need in future Pain description: discomfort, sore Aggravating factors: weight bearing Relieving factors: pain medication - takes only as needed Pt reports his Lt ankle pain is due to the weight shift onto LLE in compensation for the Rt knee pain  PRECAUTIONS: None  RED FLAGS: None   WEIGHT BEARING RESTRICTIONS: No  FALLS: Has patient fallen in last 6 months? No  LIVING ENVIRONMENT: Lives with: lives with their spouse Lives in: House/apartment Stairs: Yes: Internal: 12 steps; on right going up and External: 1 steps; none Has following equipment at home: None  PLOF: Independent  PATIENT GOALS: try to function as normally as I can   OBJECTIVE:  Note: Objective measures were completed at Evaluation unless otherwise noted.  DIAGNOSTIC FINDINGS:  N/A  COGNITION: Overall cognitive status: Within functional limits for tasks assessed   SENSATION: WFL  COORDINATION: WNL's bil. LE's  POSTURE: No Significant postural limitations  LOWER EXTREMITY ROM:  WNL's bil. LE's    LOWER EXTREMITY MMT: WNL's bil LE's    BED MOBILITY:  Not tested  TRANSFERS: Sit to stand: Complete Independence  Assistive device utilized: None      RAMP:  Not tested  CURB:  Not tested  STAIRS:  pt reports no problem with step negotiation Not tested GAIT: Findings: Gait Characteristics: WFL, Distance walked: 50', Assistive device utilized:None, Level of assistance: Complete Independence, and Comments: slightly antalgic gait pattern due to Rt knee pain and Lt ankle pain  FUNCTIONAL TESTS:  MCTSIB: Condition 1: Avg of 3 trials: 30 sec, Condition 2: Avg of 3 trials: 30 sec, Condition 3: Avg of 3 trials: 30 sec, Condition 4: Avg of 3 trials: 19 sec, and Total Score: 109/120 Positional testing = Rt and Lt sidelying tests and Dix-Hallpike tests (-) with no nystagmus and no c/o vertigo SVA - line 10:  DVA line 8 - pt reported dizziness upon completion of test                                                                                                                             TREATMENT DATE: 09-30-23  NeuroRe-ed:    Standing Balance: Surface: Airex Position: Narrow Base of  Support Feet Hip Width Apart Completed with: Eyes Open and Eyes Closed; Head Turns x 5 Reps and Head Nods x 5 Reps    Stood on Airex - read called lines/letters on [place] charts (1 colored and 1 black/white)  for improved visual scanning and gaze stabilization while standing on compliant surface Standing on Airex - moved colored [employer] chart in various directions in front of patient - pt read letters of specific called color   X1 viewing - plain background 60 secs x 1 rep horizontal and vertical 50 secs x 1 rep; progressed to patterned background - posture grid used - 30 secs  horizontal and 15 secs vertical - instructed pt to progress to patterned background for HEP as tolerated   TherAct:   Amb. Making circles CW with ball - 40' x 1 rep:  CCW with ball 40'  x 1 rep  Black and white patterned cloth placed on floor - 4 cones - pt amb. To end to pick up cone and turned around 180 degrees to place cone down on other end of cloth; progressed to diagonal walking on cloth - pick up cone and placed on other end of cloth diagonally - 4 cones used - for habituation with bending down/standing up/turning with increased visual input incorporated      HEP: see below   Access Code: [email or web address] URL: [email or web address] Date: 08/15/2023 Prepared by: Rock Kussmaul  Exercises - Standing Balance with Eyes OPEN, then CLOSED on Foam  - 1 x daily - 7 x weekly - 1 sets - 2 reps - 30 sec  hold - gaze stabilization exercise in standing - 30 secs - 3-5x daily- 30 secs each rep;  1 set - 3-5x/day; both horizontal and vertical directions   PATIENT EDUCATION: Education details: [employer] HEP [phone] Person educated: Patient Education method: Explanation, Demonstration, and Handouts Education comprehension: verbalized understanding and returned demonstration  HOME EXERCISE PROGRAM: [employer]  GOALS: Goals reviewed with patient? Yes  SHORT TERM GOALS: same as LTG's as ELOS = 4 weeks  LONG TERM GOALS: Target date: 09-19-23 (pushed out 1 week due to schedule availability)  Pt will stand for 30 secs on condition 4 of mCTSIB with minimal to no postural sway and no c/o dizziness to demo increased vestibular input in maintaining balance. Baseline: 13 secs 1st trial with moderate postural sway Goal status: IN PROGRESS  2.  Pt will perform x1 viewing exercise in standing (perform both horizontal and vertical head turns) for 60 secs with </= minimal c/o dizziness for improved gaze stabilization.   Baseline:  Goal status: IN PROGRESS  3.  Pt will subjectively report at  least 25% improvement in Rt knee and Lt ankle pain to increase ease and tolerance with ADL's & work activities.  Baseline:  Goal status: DEFERRED  4.  Independent in HEP for vestibular/balance exercises and habituation exercises as needed. Baseline:  Goal status: Goal met 09-30-23  ASSESSMENT:  CLINICAL IMPRESSION: PT session focused on visual/vestibular exercises to improve gaze stabilization with increased visual input.  Pt reported minimal dizziness with reading [facility] charts, standing on compliant surface.  Pt appears to have some visual motion sensitivity based on report of symptoms provoked with work activity.  Pt has met LTG #4 with LTG's #1 & 2 in progress; LTG #3 deferred as focus of PT has been on vestibular deficits and also due to limited visits attended as today is only the 3rd visit since initial eval on 08-14-23.  Cont with POC.  OBJECTIVE  IMPAIRMENTS: decreased balance, difficulty walking, dizziness, obesity, and pain.   ACTIVITY LIMITATIONS: bending, standing, squatting, and locomotion level  PARTICIPATION LIMITATIONS: community activity and yard work  PERSONAL FACTORS: Fitness, Past/current experiences, Time since onset of injury/illness/exacerbation, and 1 comorbidity: Rt knee OA and obesity are also affecting patient's functional outcome.   REHAB POTENTIAL: Good  CLINICAL DECISION MAKING: Evolving/moderate complexity  EVALUATION COMPLEXITY: Moderate  PLAN:  PT FREQUENCY: 1x/week  PT DURATION: 4 weeks +eval  PLANNED INTERVENTIONS: 97110-Therapeutic exercises, 97530- Therapeutic activity, [id number]- Neuromuscular re-education, 97535- Self Care, 02883- Gait training, [phone]- Canalith repositioning, and [phone]- Aquatic Therapy  PLAN FOR NEXT SESSION:  progressed x1 viewing to 60 secs plain background; 15 secs patterned background;  Balance on foam, vestibular exercises; d/c next session?   Roxanna [place], PT 10/01/2023, 4:01 PM

## 2023-10-01 ENCOUNTER — Encounter: Payer: Self-pay | Admitting: Physical Therapy

## 2023-10-01 ENCOUNTER — Ambulatory Visit: Admitting: Gastroenterology

## 2023-10-01 ENCOUNTER — Encounter: Payer: Self-pay | Admitting: Gastroenterology

## 2023-10-01 VITALS — BP 114/70 | HR 93 | Ht 70.0 in | Wt 312.4 lb

## 2023-10-01 DIAGNOSIS — K582 Mixed irritable bowel syndrome: Secondary | ICD-10-CM | POA: Diagnosis not present

## 2023-10-01 DIAGNOSIS — R140 Abdominal distension (gaseous): Secondary | ICD-10-CM

## 2023-10-01 MED ORDER — AMBULATORY NON FORMULARY MEDICATION: Status: AC

## 2023-10-01 MED ORDER — IBGARD 90 MG PO CPCR: ORAL_CAPSULE | ORAL | 0 refills | Status: AC

## 2023-10-01 NOTE — Patient Instructions (Signed)
 Please purchase the following medications over the counter and take as directed:  Citrucel - Take every day  We have given you samples of the following medication to take: Adolfo  IBgard   Stop Bentyl   Thank you for entrusting me with your care and for choosing [facility], Dr. Elspeth Naval    _______________________________________________________  If your blood pressure at your visit was 140/90 or greater, please contact your primary care physician to follow up on this.  _______________________________________________________  If you are age 59 or older, your body mass index should be between 23-30. Your Body mass index is 44.82 kg/m. If this is out of the aforementioned range listed, please consider follow up with your Primary Care Provider.  If you are age 85 or younger, your body mass index should be between 19-25. Your Body mass index is 44.82 kg/m. If this is out of the aformentioned range listed, please consider follow up with your Primary Care Provider.   ________________________________________________________  The [facility] providers would like to encourage you to use MYCHART to communicate with providers for non-urgent requests or questions.  Due to long hold times on the telephone, sending your provider a message by [facility] may be a faster and more efficient way to get a response.  Please allow 48 business hours for a response.  Please remember that this is for non-urgent requests.  _______________________________________________________  [facility] is using a team-based approach to care.  Your team is made up of your doctor and two to three APPS. Our APPS (Nurse Practitioners and Physician Assistants) work with your physician to ensure care continuity for you. They are fully qualified to address your health concerns and develop a treatment plan. They communicate directly with your gastroenterologist to care for you. Seeing the [facility]  Practice Practitioners on your physician's team can help you by facilitating care more promptly, often allowing for earlier appointments, access to diagnostic testing, procedures, and other specialty referrals.

## 2023-10-01 NOTE — Progress Notes (Signed)
 HPI :  58 year old male with a history of altered bowel habits, suspected IBS, here for follow-up visit.  He was last seen in 2023.  Recall late in 2017 he developed a severe infectious enteritis which led to an ED visit, since that time he has had altered bowel habits which include alternating loose stools and constipation with gas and bloating.  He has had cross-sectional imaging with CT scan and colonoscopy which have not revealed any pathology to account for symptoms.  His last colonoscopy was in 2022 which showed a few small polyps and no inflammation in his colon. Of note he has a chronic microcytic anemia. 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  He will typically have a cycle where he will not use the bathroom for several days, then he will have a few days worth of loose stools, then he may have normal bowel movements for a week or 2 until the cycle begins again.  We had discussed some options in the past including a daily fiber supplement.  He got the Citrucel but wife states he takes it only perhaps once per month and he has never tried taking it daily.  He has prescribed dicyclomine  but it sounds like he does not really take this much at all.  He has taken IBgard in the past which does help with his gas and bloating.  Of note he does not have any blood in his stools or new symptoms that bother him, this seems mostly stable compared to previous.  As of this June he has been on Ozempic  which is new for him.  He does not think this is really affected his bowel habits since being on it.  EGD 12/14/20: 2 cm hiatal hernia and some gastritis, biopsies negative for H. pylori.    Colonoscopy 12/14/20: - The perianal and digital rectal  examinations were normal. - Limited views of the distal terminal ileum appeared normal. - A 3 to 4 mm polyp was found in the transverse colon. The polyp was flat. The polyp was removed with a cold snare. Resection and retrieval were complete. - A 3 mm polyp was found in the sigmoid colon. The polyp was sessile. The polyp was removed with a cold snare. Resection and retrieval were complete. - Multiple small-mouthed diverticula were found in the transverse colon and left colon. - Internal hemorrhoids were found during retroflexion. - The exam was otherwise without abnormality. No inflammatory changes  1. Surgical [P], gastric antrum and gastric body - BENIGN GASTRIC MUCOSA - NO H. PYLORI, INTESTINAL METAPLASIA OR MALIGNANCY IDENTIFIED 2. Surgical [P], colon, transverse, sigmoid, polyp (2) - TUBULAR ADENOMA (1 OF 2 FRAGMENTS) - HYPERPLASTIC POLYP (1 OF 2 FRAGMENTS) - NO HIGH-GRADE DYSPLASIA OR MALIGNANCY IDENTIFIED     Past Medical History:  Diagnosis Date   Allergic rhinitis    ALLERGIC RHINITIS    Allergy    Anxiety    Gastroenteritis    GERD (gastroesophageal reflux disease)    occasionally   Gout    Hemorrhoid    Kidney stone on right side 05/2004   OBESITY, CLASS II    OSA (obstructive sleep apnea)    CPAP qhs   Oxygen deficiency    PATELLAR DISLOCATION, RIGHT    Sleep  apnea    wears cpap     Past Surgical History:  Procedure Laterality Date   HEMORRHOID SURGERY     WISDOM TOOTH EXTRACTION     Family History  Problem Relation Age of Onset   Diabetes Mother    Anemia Mother    Immunodeficiency Mother    Stroke Father    Dementia Father    Hypertension Father    Stomach cancer Maternal Grandmother    Colon cancer Neg Hx    Prostate cancer Neg Hx    Cancer Neg Hx    Heart disease Neg Hx    COPD Neg Hx    Colon polyps Neg Hx    Esophageal cancer Neg Hx    Rectal cancer Neg Hx    Social History   Tobacco Use   Smoking status: Never   Smokeless tobacco: Never   Vaping Use   Vaping status: Never Used  Substance Use Topics   Alcohol use: No    Alcohol/week: 0.0 standard drinks of alcohol   Drug use: No   Current Outpatient Medications  Medication Sig Dispense Refill   allopurinol  (ZYLOPRIM ) 100 MG tablet TAKE 1 TABLET(100 MG) BY MOUTH DAILY 90 tablet 3   azelastine  (ASTELIN ) 0.1 % nasal spray USE 2 SPRAYS IN EACH NOSTRIL TWICE DAILY AS NEEDED 30 mL 6   Cyanocobalamin (VITAMIN B 12 PO) Take 1 tablet by mouth daily.     cyclobenzaprine  (FLEXERIL ) 10 MG tablet Take 1 tablet (10 mg total) by mouth 3 (three) times daily as needed for muscle spasms. 60 tablet 0   diazepam  (VALIUM ) 5 MG tablet Take one tablet by mouth with food one hour prior to procedure. May repeat 30 minutes prior if needed. 2 tablet 0   dicyclomine  (BENTYL ) 10 MG capsule Take 1 capsule (10 mg total) by mouth 2 (two) times daily before a meal. 60 capsule 5   fexofenadine (ALLEGRA) 180 MG tablet Take 180 mg by mouth daily.     Garcinia Cambogia-Chromium 500-200 MG-MCG TABS Take 1 capsule by mouth daily.     glucose blood (ACCU-CHEK GUIDE) test strip TEST FOUR TIMES DAILY AS DIRECTED 100 strip 2   Green Tea, Camillia sinensis, (GREEN TEA PO) Take 1 capsule by mouth daily.     Lancets (ONETOUCH DELICA PLUS LANCET30G) MISC Use daily up to 4 times a day 400 each 3   losartan  (COZAAR ) 50 MG tablet Take 1 tablet (50 mg total) by mouth daily. 90 tablet 3   methocarbamol  (ROBAXIN ) 500 MG tablet Take 1 tablet (500 mg total) by mouth 3 (three) times daily. 60 tablet 0   methylcellulose (CITRUCEL) oral powder Citrucel: use as directed once daily (Patient taking differently: Taking 1 dose as needed)     mometasone  (NASONEX ) 50 MCG/ACT nasal spray Place 2 sprays into the nose daily.  1   Multiple Vitamin (MULTIVITAMIN) capsule Take 1 capsule by mouth daily.     omeprazole  (PRILOSEC) 20 MG capsule Take 1 capsule (20 mg total) by mouth daily. 90 capsule 3   OZEMPIC , 2 MG/DOSE, 8 MG/3ML SOPN INJECT 2  MG INTO SKIN ONCE WEEKLY 3 mL 5   SYSTANE ULTRA 0.4-0.3 % SOLN SMARTSIG:1 Drop(s) In Eye(s) PRN     traMADol  (ULTRAM ) 50 MG tablet Take 1 tablet (50 mg total) by mouth every 8 (eight) hours as needed. 15 tablet 0   VITAMIN D  PO Take by mouth.     No current facility-administered medications for this visit.  No Known Allergies   Review of Systems: All systems reviewed and negative except where noted in HPI.   Lab Results  Component Value Date   WBC 8.4 04/14/2023   HGB 11.5 (L) 04/14/2023   HCT 36.3 (L) 04/14/2023   MCV 69.3 Repeated and verified X2. (L) 04/14/2023   PLT 211.0 04/14/2023    Lab Results  Component Value Date   NA 135 04/14/2023   CL 98 04/14/2023   K 4.4 04/14/2023   CO2 23 04/14/2023   BUN 16 04/14/2023   CREATININE 1.00 04/14/2023   GFR 82.98 04/14/2023   CALCIUM 9.2 04/14/2023   ALBUMIN 4.2 04/14/2023   GLUCOSE 414 (H) 04/14/2023    Lab Results  Component Value Date   ALT 30 04/14/2023   AST 20 04/14/2023   ALKPHOS 90 04/14/2023   BILITOT 1.4 (H) 04/14/2023      Physical Exam: BP 114/70   Pulse 93   Ht 5' 10 (1.778 m)   Wt (!) 312 lb 6 oz (141.7 kg)   SpO2 93%   BMI 44.82 kg/m  Constitutional: Pleasant,well-developed, male in no acute distress. Neurological: Alert and oriented to person place and time. Psychiatric: Normal mood and affect. Behavior is normal.   ASSESSMENT: 58 y.o. male here for assessment of the following  1. Irritable bowel syndrome with both constipation and diarrhea   2. Bloating    Longstanding symptoms as outlined above.  Colonoscopy and CT scan over the years have not shown any concerning pathology.  His labs are stable.  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  He has alternating loose stools and constipation.  He has not tried a daily fiber supplement and I think that may be his best option right now to provide some regularity.  Specifically, recommend either Citrucel or Benefiber  given they have less risk for causing gas and bloating, which is 1 thing that can bother him.  I recommend he take Citrucel every day, he prefers capsule form which is fine, just drink plenty of water with this but I would like him to try it every day for at least a few weeks to assess response.  I think he can stop the Bentyl  although not really taking it much, this may more likely make him constipated.  To help with his gas and bloating I give him some free samples of FODZYME supplement to see if that will help.  He can also use IBgard as needed which has helped him in the past.  I counseled him that Ozempic  can alter bowel function potentially worsen his symptoms however he appears to be tolerating it well so far and wants to continue with it.  I asked him to touch base with me in the next 4 to 6 weeks to see how he is doing and if not improving we can consider other options.  He agrees   PLAN: - take Citrucel daily - every day, they prefer capsule form - stop bentyl  - FODZYME supplement PRN - samples given - IB gard PRN - samples given - counseled on Ozempic  use and how it can alter bowel function - contact me in 4-6 weeks if not happy with regimen  Marcey Naval, MD [facility]

## 2023-10-02 ENCOUNTER — Ambulatory Visit (INDEPENDENT_AMBULATORY_CARE_PROVIDER_SITE_OTHER): Admitting: Behavioral Health

## 2023-10-02 ENCOUNTER — Encounter: Payer: Self-pay | Admitting: Behavioral Health

## 2023-10-02 ENCOUNTER — Encounter: Payer: Self-pay | Admitting: Physical Therapy

## 2023-10-02 DIAGNOSIS — F411 Generalized anxiety disorder: Secondary | ICD-10-CM

## 2023-10-02 DIAGNOSIS — R454 Irritability and anger: Secondary | ICD-10-CM | POA: Diagnosis not present

## 2023-10-02 NOTE — Progress Notes (Signed)
 [facility] Counselor/Therapist Progress Note  Patient ID: Randy Hendrix, MRN: [id number],    Date: 10/02/2023  Time Spent: 56 minutes, 11:01 until 11:57 AM this session was held via video teletherapy. The patient consented to the video teletherapy and was located in his car during this session. He is aware it is the responsibility of the patient to secure confidentiality on his end of the session. The provider was in a private home office for the duration of this session.      Treatment Type: Individual Therapy The patient's work situation is still shorthanded but he hopes they will have somebody getting back up to speed soon who has been out.  He does feel that his manager is trying to protect he and his coworkers which she appreciates.  He is taking physical therapy to help with some of the dizziness and is in the early stages but is optimistic that we will be beneficial.  He says anytime that he stands up quickly he gets dizzy and has learned how to compensate they are trying to teach in a different way so the dizziness does not happen as quickly or as intensely.  After that he will work on physical therapy on his knee and ankle.  He and his wife had some good conversations about communication and misinterpretations and he feels like that is getting better.  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  He recognizes that worklife balance is work in process but he feels that their communication has been even better so that they can plan around very busy schedules.  He reports that for the most part his anxiety has been minimal as well as his irritability has been minimal.  He does contract for safety having no thoughts of hurting himself or anyone else.  Reported Symptoms: Anxiety/irritability  Mental Status Exam: Appearance:  Fairly Groomed     Behavior: Appropriate  Motor: Normal  Speech/Language:  Normal Rate   Affect: Appropriate  Mood: normal  Thought process: normal  Thought content:   WNL  Sensory/Perceptual disturbances:   WNL  Orientation: oriented to person, place, time/date, situation, day of week, month of year, and year  Attention: Good  Concentration: Good  Memory: WNL  Fund of knowledge:  Good  Insight:   Good  Judgment:  Good  Impulse Control: Good   Risk Assessment: Danger to Self:  No Self-injurious Behavior: No Danger to Others: No Duty to Warn:no Physical Aggression / Violence:No  Access to Firearms a concern: No  Gang Involvement:No   Subjective: Interventions: Cognitive Behavioral Therapy and Dialectical Behavioral Therapy  Diagnosis: Generalized anxiety disorder, irritability  Plan: I will meet with the patient virtually every 2 to 3 weeks.  Target date December 19, 2022.  Progress: 40% Goals will be to improve the patient's ability to manage anxiety and stress in a healthier way, identify causes for anxiety and explore ways to lower them, resolve any core conflicts contributing to anxiety and help the patient manage worrisome thoughts and thinking's contributing to stress and anxiety.  Interventions will include providing education about anxiety and stress to help him identify its causes, facilitate problem solution skills as well as teach coping skills to manage anxiety symptoms such as grounding exercises, progressive muscle relaxation etc.  We will also use cognitive behavioral therapy to identify and change anxiety provoking thought and behavior patterns as well as use dialectical behavior therapy distress  tolerance and mindfulness skills to help him learn better anxiety management skills.  I reviewed the goals for the patient and he would like to continue as goals stated above.  New target date is December 19, 2023. Lorrene CHRISTELLA Hasten, [facility]                Lorrene CHRISTELLA Hasten, [facility]               Lorrene CHRISTELLA Hasten,  [facility]               Lorrene CHRISTELLA Hasten, [facility]               Lorrene CHRISTELLA Hasten, [facility]               Lorrene CHRISTELLA Hasten, [facility]               Lorrene CHRISTELLA Hasten, [facility]               Lorrene CHRISTELLA Hasten, [facility]               Lorrene CHRISTELLA Hasten, [facility]               Lorrene CHRISTELLA Hasten, [facility]               Lorrene CHRISTELLA Hasten, [facility]               Lorrene CHRISTELLA Hasten, [facility]           Lorrene CHRISTELLA Hasten, [facility]               Lorrene CHRISTELLA Hasten, [facility]               Lorrene CHRISTELLA Hasten, [facility]               Lorrene CHRISTELLA Hasten, [facility]               Lorrene CHRISTELLA Hasten, [facility]               Lorrene CHRISTELLA Hasten, [facility]               Lorrene CHRISTELLA Hasten, [facility]               Lorrene CHRISTELLA Hasten, [facility]               Lorrene CHRISTELLA Hasten, [facility]

## 2023-10-08 ENCOUNTER — Telehealth: Payer: Self-pay

## 2023-10-08 NOTE — Telephone Encounter (Signed)
 Copied from CRM [id number]. Topic: General - Other >> Oct 07, 2023 11:54 AM Martinique E wrote: Reason for CRM: Patient's wife, Annetta, called asking if she could have a print off the patient's physical therapy order. Spouse stated she will be in the area tomorrow morning so questioning if she can come by then to pick it up. Callback number for wife is [phone].

## 2023-10-08 NOTE — Telephone Encounter (Signed)
 Pt wife came in today and asked for the physical therapy referrals to be printed out by me and I have given them to sarah to give back to the patient.

## 2023-10-08 NOTE — Telephone Encounter (Signed)
 Physical therapy referral  has been printed out and handed to patients wife.

## 2023-10-08 NOTE — Telephone Encounter (Signed)
 Yes, Randy Hendrix came back here and retrieve the print outs for both referrals and this was handed to the patient by lauraine

## 2023-10-23 ENCOUNTER — Ambulatory Visit: Payer: Self-pay | Attending: Internal Medicine | Admitting: Physical Therapy

## 2023-10-23 DIAGNOSIS — R42 Dizziness and giddiness: Secondary | ICD-10-CM | POA: Insufficient documentation

## 2023-10-23 DIAGNOSIS — R2681 Unsteadiness on feet: Secondary | ICD-10-CM | POA: Insufficient documentation

## 2023-10-23 NOTE — Therapy (Signed)
 OUTPATIENT PHYSICAL THERAPY NEURO TREATMENT NOTE/DISCHARGE SUMMARY   Patient Name: Randy Hendrix MRN: [id number] DOB:06/08/1965, 58 y.o., male Today's Date: 10/26/2023   PCP: Rollene Almarie LABOR, MD REFERRING PROVIDER: Rollene Almarie LABOR, MD  END OF SESSION:  PT End of Session - 10/26/23 1652     Visit Number 5    Number of Visits 5   4 visits + eval   Date for PT Re-Evaluation 09/19/23    Authorization Type [employer]    Authorization - Number of Visits 60    PT Start Time 0847    PT Stop Time 0931    PT Time Calculation (min) 44 min    Activity Tolerance Patient tolerated treatment well    Behavior During Therapy WFL for tasks assessed/performed              Past Medical History:  Diagnosis Date   Allergic rhinitis    ALLERGIC RHINITIS    Allergy    Anxiety    Gastroenteritis    GERD (gastroesophageal reflux disease)    occasionally   Gout    Hemorrhoid    Kidney stone on right side 05/2004   OBESITY, CLASS II    OSA (obstructive sleep apnea)    CPAP qhs   Oxygen deficiency    PATELLAR DISLOCATION, RIGHT    Sleep apnea    wears cpap   Past Surgical History:  Procedure Laterality Date   HEMORRHOID SURGERY     WISDOM TOOTH EXTRACTION     Patient Active Problem List   Diagnosis Date Noted   Chronic pain of right knee 07/18/2023   Chronic pain of left ankle 07/18/2023   Vertigo 07/18/2023   PTSD (post-traumatic stress disorder) 05/14/2022   Cough present for greater than 3 weeks 08/29/2021   Diarrhea 05/30/2021   Dyspnea on exertion 09/26/2020   Lumbar radiculopathy 12/07/2019   Essential hypertension 08/09/2019   Diabetes mellitus (HCC) 07/07/2018   Sleep disorder, 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   Gout 12/15/2014   Routine health maintenance 07/28/2011   Severe obesity (BMI >= 40) (HCC) 06/21/2008   OSA (obstructive sleep apnea) 04/28/2007    ONSET DATE: Referral date 07-18-23  REFERRING DIAG: M25.561,G89.29 (ICD-10-CM) - Chronic pain of right  knee M25.572,G89.29 (ICD-10-CM) - Chronic pain of left ankle R42 (ICD-10-CM) - Dizziness and giddiness  THERAPY DIAG:  Unsteadiness on feet  Dizziness and giddiness  Rationale for Evaluation and Treatment: Rehabilitation  SUBJECTIVE:  SUBJECTIVE STATEMENT: Pt reports he is doing well - had a great time on vacation; is getting ready to start PT for his knee at [facility].  Says he has learned how to manage his dizziness with the exercises - is ready for D/C Pt accompanied by: self  PERTINENT HISTORY: per chart note -- The patient is a 58 YO man coming in for medical management (see A/P for details). Worsening right knee, left ankle, lower back pain and vertigo.  PAIN:  Are you having pain? Yes: NPRS scale: 3/10 for low back - stays at 3/10 Pain location: low back, Rt knee, Lt ankle - due to OA - says not severe enough for TKA but may need in future Pain description: discomfort, sore Aggravating factors: weight bearing Relieving factors: pain medication - takes only as needed Pt reports his Lt ankle pain is due to the weight shift onto LLE in compensation for the Rt knee pain  PRECAUTIONS: None  RED FLAGS: None   WEIGHT BEARING RESTRICTIONS: No  FALLS: Has patient fallen in last 6 months? No  LIVING ENVIRONMENT: Lives with: lives with their spouse Lives in: House/apartment Stairs: Yes: Internal: 12 steps; on right going up and External: 1 steps; none Has following equipment at home: None  PLOF: Independent  PATIENT GOALS: try to function as normally as I can   OBJECTIVE:  Note: Objective measures were completed at Evaluation unless otherwise noted.  DIAGNOSTIC FINDINGS: N/A  COGNITION: Overall cognitive status: Within functional limits for tasks  assessed   SENSATION: WFL  COORDINATION: WNL's bil. LE's  POSTURE: No Significant postural limitations  LOWER EXTREMITY ROM:  WNL's bil. LE's    LOWER EXTREMITY MMT: WNL's bil LE's    BED MOBILITY:  Not tested  TRANSFERS: Sit to stand: Complete Independence  Assistive device utilized: None      RAMP:  Not tested  CURB:  Not tested  STAIRS:  pt reports no problem with step negotiation Not tested GAIT: Findings: Gait Characteristics: WFL, Distance walked: 50', Assistive device utilized:None, Level of assistance: Complete Independence, and Comments: slightly antalgic gait pattern due to Rt knee pain and Lt ankle pain  FUNCTIONAL TESTS:  MCTSIB: Condition 1: Avg of 3 trials: 30 sec, Condition 2: Avg of 3 trials: 30 sec, Condition 3: Avg of 3 trials: 30 sec, Condition 4: Avg of 3 trials: 19 sec, and Total Score: 109/120 Positional testing = Rt and Lt sidelying tests and Dix-Hallpike tests (-) with no nystagmus and no c/o vertigo SVA - line 10:  DVA line 8 - pt reported dizziness upon completion of test                                                                                                                             TREATMENT DATE: 10-23-23  NeuroRe-ed:    X1 viewing - plain background 60 secs x 1 rep horizontal and vertical 60 secs x 1 rep - no dizziness  reported upon completion of either direction   Standing Balance: Surface: Airex Position: Narrow Base of Support Feet Hip Width Apart Completed with: Eyes Open and Eyes Closed; Head Turns x 5 Reps and Head Nods x 5 Reps  Pt stood with feet together on Airex with EC for 30 secs (for LTG assessment) - no LOB occurred    Amb. Making circles CW with ball - 30' x 1 rep:  CCW with ball 30'  x 1 rep; made + and v patterns with ball with head and eye moving for improved gaze stabilization during ambulation  VOR cancellation - pt amb. 30' x 2 reps moving letter for VOR cancellation Amb. 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 Reviewed HEP for management of symptoms     HEP: see below   Access Code: [email or web address] URL: [email or web address] Date: 08/15/2023 Prepared by: Rock Kussmaul  Exercises - Standing Balance with Eyes OPEN, then CLOSED on Foam  - 1 x daily - 7 x weekly - 1 sets - 2 reps - 30 sec  hold - gaze stabilization exercise in standing - 30 secs - 3-5x daily- 30 secs each rep;  1 set - 3-5x/day; both horizontal and vertical directions   PATIENT EDUCATION: Education details: Medbridge HEP [phone] Person educated: Patient Education method: Explanation, Demonstration, and Handouts Education comprehension: verbalized understanding and returned demonstration  HOME EXERCISE PROGRAM: Medbridge  GOALS: Goals reviewed with patient? Yes  SHORT TERM GOALS: same as LTG's as ELOS = 4 weeks  LONG TERM GOALS: Target date: 09-19-23 (pushed out 1 week due to schedule availability)  Pt will stand for 30 secs on condition 4 of mCTSIB with minimal to no postural sway and no c/o dizziness to demo increased vestibular input in maintaining balance. Baseline: 13 secs 1st trial with moderate postural sway;   goal met 10-23-23 Goal status: Goal met 10-23-23  2.  Pt will perform x1 viewing exercise in standing (perform both horizontal and vertical head turns) for 60 secs with </= minimal c/o dizziness for improved gaze stabilization.   Baseline:  Goal status: Goal met 10-23-23  3.  Pt will subjectively report at least 25% improvement in Rt knee and Lt ankle pain to increase ease and tolerance with ADL's & work activities.  Baseline:  Goal status: DEFERRED - pt is scheduled to receive ortho PT at [facility]   4.  Independent in HEP for vestibular/balance  exercises and habituation exercises as needed. Baseline:  Goal status: Goal met 09-30-23  ASSESSMENT:  CLINICAL IMPRESSION: PT session focused on assessment of LTG's and review of HEP for discharge from PT.  Pt has met LTG's #1, 2, and 4:  LTG #3 deferred as pt reports he wants to receive PT for his knee at [facility], where is wife is currently receiving therapy (wants to go with her and get PT at that facility).  Pt has made excellent progress in vestibular PT and reports he is able to better manage his dizziness with exercises prescribed for HEP.  Pt is discharged due to goals met.    OBJECTIVE IMPAIRMENTS: decreased balance, difficulty walking, dizziness, obesity, and pain.   ACTIVITY LIMITATIONS: bending, standing, squatting, and locomotion level  PARTICIPATION LIMITATIONS: community activity and yard work  PERSONAL FACTORS: Fitness, Past/current experiences, Time since onset  of injury/illness/exacerbation, and 1 comorbidity: Rt knee OA and obesity are also affecting patient's functional outcome.   REHAB POTENTIAL: Good  CLINICAL DECISION MAKING: Evolving/moderate complexity  EVALUATION COMPLEXITY: Moderate  PLAN:  PT FREQUENCY: 1x/week  PT DURATION: 4 weeks +eval  PLANNED INTERVENTIONS: 97110-Therapeutic exercises, 97530- Therapeutic activity, [id number]- Neuromuscular re-education, [phone]- Self Care, [id number]- Gait training, [phone]- Canalith repositioning, and [phone]- Aquatic Therapy  PLAN FOR NEXT SESSION:  N/A - D/C on 10-23-23   PHYSICAL THERAPY DISCHARGE SUMMARY  Visits from Start of Care: 5  Current functional level related to goals / functional outcomes: See above for progress towards goals   Remaining deficits: Continue c/o dizziness with increased visual input  - pt appears to have some visual motion sensitivity Continued c/o knee pain - pt reports he wishes to receive PT at [facility] to address this issue   Education / Equipment: Pt has been instructed in HEP for  vestibular exercises   Patient agrees to discharge. Patient goals were met. Patient is being discharged due to meeting the stated rehab goals.    Roxanna [place] Area, PT 10/26/2023, 4:55 PM

## 2023-10-24 ENCOUNTER — Ambulatory Visit (INDEPENDENT_AMBULATORY_CARE_PROVIDER_SITE_OTHER): Admitting: Behavioral Health

## 2023-10-24 ENCOUNTER — Encounter: Payer: Self-pay | Admitting: Behavioral Health

## 2023-10-24 DIAGNOSIS — F411 Generalized anxiety disorder: Secondary | ICD-10-CM

## 2023-10-24 DIAGNOSIS — F331 Major depressive disorder, recurrent, moderate: Secondary | ICD-10-CM

## 2023-10-24 DIAGNOSIS — R454 Irritability and anger: Secondary | ICD-10-CM | POA: Diagnosis not present
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
## 2023-10-25 ENCOUNTER — Other Ambulatory Visit: Payer: Self-pay | Admitting: Internal Medicine

## 2023-10-26 ENCOUNTER — Encounter: Payer: Self-pay | Admitting: Physical Therapy

## 2023-10-27 ENCOUNTER — Telehealth: Payer: Self-pay | Admitting: Podiatry

## 2023-10-27 ENCOUNTER — Other Ambulatory Visit: Payer: Self-pay | Admitting: Internal Medicine

## 2023-10-27 NOTE — Telephone Encounter (Signed)
 The patient's wife, Randy Hendrix, reports that she has left several messages but has not received a returned call. She is inquiring about a date of service on 11/12/2022 with a charge of $170.00.  According to [employer], the claim was filed with the wrong code and needs to be coded as a diabetic visit in order to be covered. Could you please look into this and give Randy Hendrix a call to provide an update and discuss further?  Thank you.

## 2023-10-28 ENCOUNTER — Ambulatory Visit (INDEPENDENT_AMBULATORY_CARE_PROVIDER_SITE_OTHER): Admitting: Podiatry

## 2023-10-28 ENCOUNTER — Ambulatory Visit (INDEPENDENT_AMBULATORY_CARE_PROVIDER_SITE_OTHER)

## 2023-10-28 ENCOUNTER — Ambulatory Visit: Payer: 59 | Admitting: Podiatry

## 2023-10-28 VITALS — Ht 70.0 in | Wt 312.0 lb

## 2023-10-28 DIAGNOSIS — M7752 Other enthesopathy of left foot: Secondary | ICD-10-CM | POA: Diagnosis not present

## 2023-10-28 DIAGNOSIS — M2141 Flat foot [pes planus] (acquired), right foot: Secondary | ICD-10-CM

## 2023-10-28 DIAGNOSIS — M2142 Flat foot [pes planus] (acquired), left foot: Secondary | ICD-10-CM | POA: Diagnosis not present

## 2023-10-28 DIAGNOSIS — M79675 Pain in left toe(s): Secondary | ICD-10-CM | POA: Diagnosis not present

## 2023-10-28 DIAGNOSIS — M79674 Pain in right toe(s): Secondary | ICD-10-CM

## 2023-10-28 DIAGNOSIS — B351 Tinea unguium: Secondary | ICD-10-CM | POA: Diagnosis not present

## 2023-10-28 DIAGNOSIS — M62462 Contracture of muscle, left lower leg: Secondary | ICD-10-CM | POA: Diagnosis not present

## 2023-10-28 DIAGNOSIS — M76822 Posterior tibial tendinitis, left leg: Secondary | ICD-10-CM

## 2023-10-28 MED ORDER — KETOCONAZOLE 2 % EX CREA: 1.0000 | TOPICAL_CREAM | Freq: Every day | CUTANEOUS | 2 refills | Status: AC

## 2023-10-28 NOTE — Progress Notes (Signed)
 Subjective:  Patient ID: Randy Hendrix, male    DOB: 1965/12/06,  MRN: [id number]  Chief Complaint  Patient presents with   Ankle Pain    Rm 2 Pt is here for ankle pain and swelling. 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 Pt uses cold therapy for pain relief. Patient is requesting nail trimming today.    Discussed the use of AI scribe software for clinical note transcription with the patient, who gave verbal consent to proceed.  History of Present Illness Randy Hendrix is a 58 year old male who presents with ankle pain and swelling.  He experiences worsening pain in his ankle, and reports a history of knee issues that began during his time in the [name]. The pain is rhythmic, starting at the ankle and extending downward, with tenderness when pressure is applied to specific areas of the ankle. He wears regular insoles with his boots for work and experiences pain when pulling his foot back towards him and pushing in certain directions.  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 He wears boots that go above the ankle, providing some support. He is currently undergoing physical therapy for his knee at [facility] and wants his foot worked on as well. He sees Dr. May every nine weeks for follow-up on his diabetic foot exams and care.  He reports soreness at the bottom and side of his foot, which has improved but still persists, along with swelling in the area. He has a history of flat feet, a family trait, and experiences tightness in his Achilles tendon, similar to other family members.  He reports moisture between his toes.      Objective:    Physical Exam VASCULAR: DP and PT pulse palpable. Foot is warm and well-perfused. Capillary fill time is brisk. DERMATOLOGIC: Xerosis cutis noted. Interdigital tinea pedis of multiple areas.  Thick and elongated mycotic nails. NEUROLOGIC: Normal sensation to light touch and pressure. No paresthesias on examination. ORTHOPEDIC: Pain and swelling along medial posterior ankle, posterior tibial tendon. Pain with resisted eversion, plantar flexion, and dorsiflexion. Able to do double heel rise, partial single heel rise with pain. Pes planus deformity noted. No ecchymosis or bruising. No gross deformity.   No images are attached to the encounter.    Results RADIOLOGY Left ankle radiograph: No fracture or stress fracture. Pes planus deformity. Ankle mortise well aligned and intact without visible osteochondral defect (OCD) lesions. (10/28/2023)   Assessment:   1. Posterior tibial tendon dysfunction (PTTD) of left lower extremity   2. Gastrocnemius equinus of left lower extremity   3. Pes planus of both feet   4. Pain due to onychomycosis of toenails of both feet      Plan:  Patient was evaluated and treated and all questions answered.  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 Pain with resisted eversion, plantar flexion, and dorsiflexion. Able to perform a double heel rise and partially able to do a single heel rise with pain. X-ray shows no fracture or stress fracture, and the ankle mortis is well aligned. Likely due to overuse and compensatory stress from knee issues. No evidence of tendon tear, but inflammation and thickening are present. - Prescribe lace-up  ankle brace for temporary support.  We do not have a size that would fit him correctly so he will purchase this on line - Refer to physical therapy for ankle strengthening exercises.  Starting PT at [facility] soon for his right knee we will send additional referral for his left ankle -  Prescribe custom orthotics for better arch support.  Appointment be scheduled with [name] for this -Trial Voltaren  gel as needed - Advise limited use of NSAIDs like ibuprofen or Aleve  due to kidney concerns. - Consider oral steroids like prednisone  if significant swelling and pain occur, with caution due to potential blood sugar impact. - Schedule MRI if no improvement after six weeks of physical therapy.  Interdigital tinea pedis, left foot Interdigital tinea pedis between toes of the left foot with moisture indicating fungal infection. - Prescribe ketoconazole  cream for application between toes daily. - Advise drying thoroughly between toes to prevent moisture accumulation.  Onychomycosis, left foot Discussed the etiology and treatment options for the condition in detail with the patient. Recommended debridement of the nails today. Sharp and mechanical debridement performed of all painful and mycotic nails today. Nails debrided in length and thickness using a nail nipper to level of comfort. Follow up as needed for painful nails.   Xerosis cutis, left foot Xerosis cutis on the left foot with dry skin present.  Follow-Up - Schedule follow-up appointment in six weeks to assess ankle progress after physical therapy. - Arrange appointment with [name] for custom insoles fitting.       Return in about 6 weeks (around 12/09/2023) for PT tendinitis left.

## 2023-10-28 NOTE — Patient Instructions (Signed)
 VISIT SUMMARY: Today, you were seen for ankle pain and swelling, which has been affecting your ability to work comfortably. You also reported soreness and moisture between your toes, and you have a history of knee issues and flat feet.  YOUR PLAN: -LEFT POSTERIOR TIBIAL TENDINITIS WITH PES PLANUS: This condition involves inflammation of the posterior tibial tendon, often associated with flat feet, causing pain and swelling in the ankle. You will be prescribed a lace-up ankle brace for support, referred to physical therapy for ankle strengthening exercises, and given custom orthotics for better arch support. You will also use topical glutaral gel for inflammation and limit the use of NSAIDs like ibuprofen or Aleve  due to kidney concerns. If significant swelling and pain occur, oral steroids like prednisone  may be considered, but with caution due to potential blood sugar impact. An MRI will be scheduled if there is no improvement after six weeks of physical therapy.  -INTERDIGITAL TINEA PEDIS, LEFT FOOT: This is a fungal infection between the toes, often caused by moisture. You will use ketoconazole  cream daily between your toes and ensure the area is thoroughly dried to prevent moisture accumulation.  -ONYCHOMYCOSIS, LEFT FOOT: This is a fungal infection of the nails, causing them to become thick and elongated. Treatment will be discussed further if needed.  -XEROSIS CUTIS, LEFT FOOT: This condition involves dry skin on your left foot. Moisturizing regularly can help manage this condition.  INSTRUCTIONS: Please schedule a follow-up appointment in six weeks to assess your ankle progress after physical therapy. Arrange an appointment with [name] for custom insoles fitting. Confirm your follow-up with Doctor May in nine weeks.                      Contains text generated by Abridge.                                 Contains text generated by  Abridge.  Posterior Tibial Tendinitis  Posterior tibial tendinitis is irritation of a tendon called the posterior tibial tendon. Your posterior tibial tendon is a cord-like tissue that connects bones of your lower leg and foot to a muscle that: Supports your arch. Helps you raise up on your toes. Helps you turn your foot down and in. This condition causes foot and ankle pain. It can also lead to a flat foot. What are the causes? This condition is most often caused by repeated stress to the tendon (overuse injury). It can also be caused by a sudden injury that stresses the tendon, such as landing on your foot after jumping or falling. What increases the risk? This condition is more likely to develop in: People who play a sport that involves putting a lot of pressure on the feet, such as: Basketball. Tennis. Soccer. Hockey. Runners. Females who are older than 59 years of age and are overweight. People with diabetes. People with decreased foot stability. People with flat feet. What are the signs or symptoms? Symptoms include: Pain in the inner ankle. Pain at the arch of your foot. Pain that gets worse with running, walking, or standing. Swelling on the inside of your ankle and foot. Weakness in your ankle or foot. Inability to stand up on tiptoe. Flattening of the arch of your foot. How is this diagnosed? This condition may be diagnosed based on: Your symptoms. Your medical history. A physical exam. Tests, such as: X-ray. MRI. Ultrasound. How is this treated?  This condition may be treated by: Putting ice to the injured area. Taking NSAIDs, such as ibuprofen, to reduce pain and swelling. Wearing a special shoe or shoe insert to support your arch (orthotic). Having physical therapy. Replacing high-impact exercise with low-impact exercise, such as swimming or cycling. If your symptoms do not improve with these treatments, you may need to wear a splint, removable walking boot,  or short leg cast for 6-8 weeks to keep your foot and ankle still (immobilized). Follow these instructions at home: If you have a cast, splint, or boot: Keep it clean and dry. Check the skin around it every day. Tell your health care provider about any concerns. If you have a cast: Do not stick anything inside it to scratch your skin. Doing that increases your risk of infection. You may put lotion on dry skin around the edges of the cast. Do not put lotion on the skin underneath the cast. If you have a splint or boot: Wear it as told by your health care provider. Remove it only as told by your health care provider. Loosen it if your toes tingle, become numb, or turn cold and blue. Bathing Do not take baths, swim, or use a hot tub until your health care provider approves. Ask your health care provider if you may take showers. If your cast, splint, or boot is not waterproof: Do not let it get wet. Cover it with a waterproof covering while you take a bath or a shower. Managing pain and swelling   If directed, put ice on the injured area. If you have a removable splint or boot, remove it as told by your health care provider. Put ice in a plastic bag. Place a towel between your skin and the bag or between your cast and the bag. Leave the ice on for 20 minutes, 2-3 times a day. Move your toes often to reduce stiffness and swelling. Raise (elevate) the injured area above the level of your heart while you are sitting or lying down. Activity Do not use the injured foot to support your body weight until your health care provider says that you can. Use crutches as told by your health care provider. Do not do activities that make pain or swelling worse. Ask your health care provider when it is safe to drive if you have a cast, splint, or boot on your foot. Return to your normal activities as told by your health care provider. Ask your health care provider what activities are safe for you. Do  exercises as told by your health care provider. General instructions Take over-the-counter and prescription medicines only as told by your health care provider. If you have an orthotic, use it as told by your health care provider. Keep all follow-up visits as told by your health care provider. This is important. How is this prevented? Wear footwear that is appropriate to your athletic activity. Avoid athletic activities that cause pain or swelling in your ankle or foot. Before being active, do range-of-motion and stretching exercises. If you develop pain or swelling while training, stop training. If you have pain or swelling that does not improve after a few days of rest, see your health care provider. If you start a new athletic activity, start gradually so you can build up your strength and flexibility. Contact a health care provider if: Your symptoms get worse. Your symptoms do not improve in 6-8 weeks. You develop new, unexplained symptoms. Your splint, boot, or cast gets damaged. Summary Posterior  tibial tendinitis is irritation of a tendon called the posterior tibial tendon. This condition is most often caused by repeated stress to the tendon (overuse injury). This condition causes foot pain and ankle pain. It can also lead to a flat foot. This condition may be treated by not doing high-impact activities, applying ice, having physical therapy, wearing orthotics, and wearing a cast, splint, or boot if needed. This information is not intended to replace advice given to you by your health care provider. Make sure you discuss any questions you have with your health care provider. Document Revised: 06/02/2018 Document Reviewed: 04/09/2018 Elsevier Patient Education  2020 [employer]  Posterior Tibial Tendinitis Rehab Ask your health care provider which exercises are safe for you. Do exercises exactly as told by your health care provider and adjust them as directed. It is normal to feel  mild stretching, pulling, tightness, or discomfort as you do these exercises. Stop right away if you feel sudden pain or your pain gets worse. Do not begin these exercises until told by your health care provider. Stretching and range-of-motion exercises These exercises warm up your muscles and joints and improve the movement and flexibility in your ankle and foot. These exercises may also help to relieve pain. Standing wall calf stretch, knee straight   Stand with your hands against a wall. Extend your left / right leg behind you, and bend your front knee slightly. If directed, place a folded washcloth under the arch of your foot for support. Point the toes of your back foot slightly inward. Keeping your heels on the floor and your back knee straight, shift your weight toward the wall. Do not allow your back to arch. You should feel a gentle stretch in your upper left / right calf. Hold this position for 10 seconds. Repeat 10 times. Complete this exercise 2 times a day. Standing wall calf stretch, knee bent Stand with your hands against a wall. Extend your left / right leg behind you, and bend your front knee slightly. If directed, place a folded washcloth under the arch of your foot for support. Point the toes of your back foot slightly inward. Unlock your back knee so it is bent. Keep your heels on the floor. You should feel a gentle stretch deep in your lower left / right calf. Hold this position for 10 seconds. Repeat 10 times. Complete this exercise 2 times a day. Strengthening exercises These exercises build strength and endurance in your ankle and foot. Endurance is the ability to use your muscles for a long time, even after they get tired. Ankle inversion with band Secure one end of a rubber exercise band or tubing to a fixed object, such as a table leg or a pole, that will stay still when the band is pulled. Loop the other end of the band around the middle of your left / right  foot. Sit on the floor facing the object with your left / right leg extended. The band or tube should be slightly tense when your foot is relaxed. Leading with your big toe, slowly bring your left / right foot and ankle inward, toward your other foot (inversion). Hold this position for 10 seconds. Slowly return your foot to the starting position. Repeat 10 times. Complete this exercise 2 times a day. Towel curls   Sit in a chair on a non-carpeted surface, and put your feet on the floor. Place a towel in front of your feet. Keeping your heel on the floor, put  your left / right foot on the towel. Pull the towel toward you by grabbing the towel with your toes and curling them under. Keep your heel on the floor while you do this. Let your toes relax. Grab the towel with your toes again. Keep going until the towel is completely underneath your foot. Repeat 10 times. Complete this exercise 2 times a day. Balance exercise This exercise improves or maintains your balance. Balance is important in preventing falls. Single leg stand Without wearing shoes, stand near a railing or in a doorway. You may hold on to the railing or door frame as needed for balance. Stand on your left / right foot. Keep your big toe down on the floor and try to keep your arch lifted. If balancing in this position is too easy, try the exercise with your eyes closed or while standing on a pillow. Hold this position for 10 seconds. Repeat 10 times. Complete this exercise 2 times a day. This information is not intended to replace advice given to you by your health care provider. Make sure you discuss any questions you have with your health care provider.

## 2023-11-12 NOTE — Progress Notes (Unsigned)
 HPI  M never smoker followed for OSA, former third shift worker, complicated by morbid obesity, allergic rhinitis, GERD, NPSG 2000:  AHI 124/hr  ----------------------------------------------------------------    06/26/23- 58 year old male never smoker followed for OSA, Second -shift Worker, complicated by GERD, allergic rhinitis, obesity, DM2, Gout, Anxiety,  CPAP  Auto 10-20/[facility] Download-compliance 100%. AHI 0.5/hr Body weight today-303 lbs -----Doing well.  Sleep is good.   Discussed the use of AI scribe software for clinical note transcription with the patient, who gave verbal consent to proceed.  History of Present Illness   Randy Hendrix is a 58 year old male with sleep apnea who presents for CPAP therapy follow-up. He is accompanied by his wife.  He uses his CPAP machine nightly with a pressure setting between eleven and twelve centimeters of water, experiencing less than one breakthrough apnea per hour. Snoring and mouth breathing occur while using a nasal mask. He sleeps six to six and a half hours on nights without appointments, with additional sleep on weekends. Frequent naps occur, often in a chair, but he feels his sleep is stable. His wife notes snoring through the CPAP and occasional sleep duration of four and a half to five hours. We discussed need for adequate sleep time. He has trouble unwinding on return home from work. SABRA He denies issues with the CPAP machine's performance. He is using a nasal mask and we discussed trial of sleep tape to keep mouth closed.     Assessment and Plan:    Obstructive Sleep Apnea Obstructive sleep apnea well-managed with CPAP. Apnea events <1/hr. Snoring and mouth breathing likely due to air escape from nasal mask. CPAP provider transition needed. - Recommend snore tape/ sleep tape to reduce mouth breathing and air escape. - Coordinate CPAP service transition to new provider in [place].   Sleep Hygiene -encourage  good sleep habits and adequate sleep time   Obesity -continue to work on diet, exsercise      11/13/23- 58 year old male never smoker followed for OSA, Second -shift Worker, complicated by GERD, allergic rhinitis, obesity, DM2, Gout, Anxiety,  CPAP  Auto 10-20/[facility] Download-compliance  Body weight today-315 lbs         ROS-see HPI + = positive Constitutional:    weight loss, night sweats, fevers, chills, fatigue, lassitude. HEENT:    headaches, difficulty swallowing, tooth/dental problems, sore throat,       sneezing, itching, ear ache, nasal congestion, post nasal drip, snoring CV:    chest pain, orthopnea, PND, swelling in lower extremities, anasarca,                                                     dizziness, palpitations Resp:   shortness of breath with exertion or at rest.                productive cough,  + non-productive cough, coughing up of blood.              change in color of mucus.  +wheezing.   Skin:    rash or lesions. GI:  No-   heartburn, indigestion, abdominal pain, nausea, vomiting, diarrhea,                 change in bowel habits, loss of appetite GU: dysuria, change in color of urine, no urgency or  frequency.   flank pain. MS:   joint pain, stiffness, decreased range of motion, back pain. Neuro-     nothing unusual Psych:  change in mood or affect.  depression or anxiety.   memory loss.  OBJ- Physical Exam General- Alert, Oriented, Affect-appropriate, Distress- none acute + obese Skin- rash-none, lesions- none, excoriation- none Lymphadenopathy- none Head- atraumatic            Eyes- Gross vision intact, PERRLA, conjunctivae and secretions clear            Ears- Hearing, canals-normal            Nose- Clear, no-Septal dev, mucus, polyps, erosion, perforation             Throat- Mallampati IV , mucosa clear , drainage- none, tonsils- atrophic Neck- flexible , trachea midline, no stridor , thyroid  nl, carotid no bruit Chest - symmetrical  excursion , unlabored           Heart/CV- RRR , no murmur , no gallop  , no rub, nl s1 s2                           - JVD- none , edema+1, stasis changes- none, varices- none           Lung- + trace basilar crackles/unlabored, wheeze+mild, cough- none , dullness-none, rub- none           Chest wall-  Abd-  Br/ Gen/ Rectal- Not done, not indicated Extrem- cyanosis- none, clubbing, none, atrophy- none, strength- nl Neuro- grossly intact to observation

## 2023-11-13 ENCOUNTER — Encounter: Payer: Self-pay | Admitting: Internal Medicine

## 2023-11-13 ENCOUNTER — Ambulatory Visit: Admitting: Behavioral Health

## 2023-11-13 ENCOUNTER — Ambulatory Visit: Admitting: Internal Medicine

## 2023-11-13 VITALS — BP 108/66 | HR 95 | Ht 70.0 in | Wt 315.0 lb

## 2023-11-13 DIAGNOSIS — G4733 Obstructive sleep apnea (adult) (pediatric): Secondary | ICD-10-CM

## 2023-11-13 NOTE — Patient Instructions (Signed)
 You are doing well with CPAP- we can continue auto 10-20  Please call if we can help

## 2023-11-14 ENCOUNTER — Encounter: Payer: Self-pay | Admitting: Internal Medicine

## 2023-11-14 ENCOUNTER — Ambulatory Visit: Admitting: Internal Medicine

## 2023-11-14 VITALS — BP 122/84 | HR 80 | Temp 97.7°F | Ht 70.0 in | Wt 317.0 lb

## 2023-11-14 DIAGNOSIS — H9190 Unspecified hearing loss, unspecified ear: Secondary | ICD-10-CM

## 2023-11-14 DIAGNOSIS — E119 Type 2 diabetes mellitus without complications: Secondary | ICD-10-CM

## 2023-11-14 DIAGNOSIS — Z6841 Body Mass Index (BMI) 40.0 and over, adult: Secondary | ICD-10-CM

## 2023-11-14 DIAGNOSIS — Z0000 Encounter for general adult medical examination without abnormal findings: Secondary | ICD-10-CM | POA: Diagnosis not present

## 2023-11-14 DIAGNOSIS — Z23 Encounter for immunization: Secondary | ICD-10-CM

## 2023-11-14 LAB — LIPID PANEL
Cholesterol: 103 mg/dL (ref 0–200)
HDL: 38.4 mg/dL — ABNORMAL LOW (ref >39.00)
LDL Cholesterol: 55 mg/dL (ref 0–99)
NonHDL: 64.58
Total CHOL/HDL Ratio: 3
Triglycerides: 47 mg/dL (ref 0.0–149.0)
VLDL: 9.4 mg/dL (ref 0.0–40.0)

## 2023-11-14 LAB — CBC
HCT: 36.2 % — ABNORMAL LOW (ref 39.0–52.0)
Hemoglobin: 11.4 g/dL — ABNORMAL LOW (ref 13.0–17.0)
MCHC: 31.5 g/dL (ref 30.0–36.0)
MCV: 68.8 fl — ABNORMAL LOW (ref 78.0–100.0)
Platelets: 221 K/uL (ref 150.0–400.0)
RBC: 5.26 Mil/uL (ref 4.22–5.81)
RDW: 18.2 % — ABNORMAL HIGH (ref 11.5–15.5)
WBC: 9.6 K/uL (ref 4.0–10.5)

## 2023-11-14 LAB — COMPREHENSIVE METABOLIC PANEL WITH GFR
ALT: 18 U/L (ref 0–53)
AST: 18 U/L (ref 0–37)
Albumin: 4.3 g/dL (ref 3.5–5.2)
Alkaline Phosphatase: 79 U/L (ref 39–117)
BUN: 12 mg/dL (ref 6–23)
CO2: 30 meq/L (ref 19–32)
Calcium: 9.3 mg/dL (ref 8.4–10.5)
Chloride: 102 meq/L (ref 96–112)
Creatinine, Ser: 1.03 mg/dL (ref 0.40–1.50)
GFR: 79.76 mL/min (ref >60.00)
Glucose, Bld: 89 mg/dL (ref 70–99)
Potassium: 4.3 meq/L (ref 3.5–5.1)
Sodium: 139 meq/L (ref 135–145)
Total Bilirubin: 1.1 mg/dL (ref 0.2–1.2)
Total Protein: 6.6 g/dL (ref 6.0–8.3)

## 2023-11-14 LAB — PSA: PSA: 0.68 ng/mL (ref 0.10–4.00)

## 2023-11-14 LAB — HEMOGLOBIN A1C: Hgb A1c MFr Bld: 6.1 % (ref 4.6–6.5)

## 2023-11-14 LAB — MICROALBUMIN / CREATININE URINE RATIO
Creatinine,U: 107.6 mg/dL
Microalb Creat Ratio: UNDETERMINED mg/g (ref 0.0–30.0)
Microalb, Ur: <0.7 mg/dL

## 2023-11-14 MED ORDER — TRAMADOL HCL 50 MG PO TABS: 50.0000 mg | ORAL_TABLET | Freq: Three times a day (TID) | ORAL | 0 refills | Status: AC | PRN

## 2023-11-14 MED ORDER — CYCLOBENZAPRINE HCL 10 MG PO TABS: 10.0000 mg | ORAL_TABLET | Freq: Three times a day (TID) | ORAL | 0 refills | Status: AC | PRN

## 2023-11-14 MED ORDER — OMEPRAZOLE 20 MG PO CPDR: 20.0000 mg | DELAYED_RELEASE_CAPSULE | Freq: Every day | ORAL | 3 refills | Status: DC

## 2023-11-14 MED ORDER — LOSARTAN POTASSIUM 50 MG PO TABS: 50.0000 mg | ORAL_TABLET | Freq: Every day | ORAL | 3 refills | Status: DC

## 2023-11-14 MED ORDER — HYDROCODONE-ACETAMINOPHEN 5-325 MG PO TABS: 1.0000 | ORAL_TABLET | Freq: Four times a day (QID) | ORAL | 0 refills | Status: AC | PRN

## 2023-11-14 NOTE — Assessment & Plan Note (Signed)
 BMI 45 and he is working on reducing. They will increase focus on diet.

## 2023-11-14 NOTE — Patient Instructions (Signed)
 Try some carpal tunnel braces

## 2023-11-14 NOTE — Assessment & Plan Note (Signed)
 Checking HgA1c, UACR, lipid panel, CMP. Adjust as needed regimen.

## 2023-11-14 NOTE — Assessment & Plan Note (Signed)
 Flu shot given. Pneumonia up to date. Shingrix  complete. Tetanus up to date. Colonoscopy up to date. Counseled about sun safety and mole surveillance. Counseled about the dangers of distracted driving. Given 10 year screening recommendations.

## 2023-11-14 NOTE — Progress Notes (Signed)
   Subjective:   Patient ID: Randy Hendrix, male    DOB: [birth year], 58 y.o.   MRN: [id number]  The patient is here for physical. Pertinent topics discussed: Discussed the use of AI scribe software for clinical note transcription with the patient, who gave verbal consent to proceed.  History of Present Illness Randy Hendrix is a 58 year old male who presents with chronic pain and hand numbness and tingling.  He experiences chronic pain in his back, knee, and ankle, which varies in intensity and location. The pain is constant and can be worse on certain days, significantly impacting his daily activities and work, especially on Wednesdays. He uses methocarbamol  and tramadol  for pain management and mentions needing a refill for Flexeril , which he finds helpful. Hydrocodone  is taken as needed for pain.  He reports issues with his hand, including dropping objects and experiencing sharp pain upon touching things. Episodes of numbness and tingling occur, particularly when he sleeps with his arm above his head, sometimes waking him up. He is unsure of the cause but mentions it feels like nerve damage. He has previously undergone surgery on his hand, which has not fully restored his range of motion.  He uses a CPAP machine for sleep apnea, which is working well, although symptoms increase during allergy season. No new chest pain, tightness, or pressure is reported.  PMH, [facility], social history reviewed and updated  Review of Systems  Constitutional: Negative.   HENT: Negative.    Eyes: Negative.   Respiratory:  Negative for cough, chest tightness and shortness of breath.   Cardiovascular:  Negative for chest pain, palpitations and leg swelling.  Gastrointestinal:  Negative for abdominal distention, abdominal pain, constipation, diarrhea, nausea and vomiting.  Musculoskeletal:  Positive for arthralgias, back pain and myalgias.  Skin: Negative.   Neurological: Negative.    Psychiatric/Behavioral: Negative.      Objective:  Physical Exam Constitutional:      Appearance: He is well-developed. He is obese.  HENT:     Head: Normocephalic and atraumatic.  Cardiovascular:     Rate and Rhythm: Normal rate and regular rhythm.  Pulmonary:     Effort: Pulmonary effort is normal. No respiratory distress.     Breath sounds: Normal breath sounds. No wheezing or rales.  Abdominal:     General: Bowel sounds are normal. There is no distension.     Palpations: Abdomen is soft.     Tenderness: There is no abdominal tenderness.  Musculoskeletal:        General: Tenderness present.     Cervical back: Normal range of motion.  Skin:    General: Skin is warm and dry.  Neurological:     Mental Status: He is alert and oriented to person, place, and time.     Coordination: Coordination normal.     Vitals:   11/14/23 0735  BP: 122/84  Pulse: 80  Temp: 97.7 F (36.5 C)  TempSrc: Temporal  SpO2: 96%  Weight: (!) 317 lb (143.8 kg)  Height: 5' 10 (1.778 m)    Assessment & Plan:  Flu shot given at visit

## 2023-11-17 ENCOUNTER — Ambulatory Visit: Payer: Self-pay | Admitting: Internal Medicine

## 2023-11-20 ENCOUNTER — Ambulatory Visit: Admitting: Behavioral Health

## 2023-11-20 ENCOUNTER — Encounter: Payer: Self-pay | Admitting: Behavioral Health

## 2023-11-20 DIAGNOSIS — R454 Irritability and anger: Secondary | ICD-10-CM

## 2023-11-20 DIAGNOSIS — F411 Generalized anxiety disorder: Secondary | ICD-10-CM | POA: Diagnosis not present

## 2023-11-20 NOTE — Progress Notes (Signed)
 [facility] Counselor/Therapist Progress Note  Patient ID: Randy Hendrix, MRN: [id number],    Date: 11/20/2023  Time Spent: 57 minutes, 802 AM until 859 AM this session was held via video teletherapy. The patient consented to the video teletherapy and was located in his car during this session. He is aware it is the responsibility of the patient to secure confidentiality on his end of the session. The provider was in a private home office for the duration of this session.      Treatment Type: Individual Therapy The patient is tired and in significant pain.  He has been doing physical therapy for his dizziness and once at times and feels that he made progress with that.  That will not go away but he has needed to hold to deal with it.  He is now in physical therapy for his knees ankle back etc.  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  He has family are excited about that.  His daughter will have to have an additional surgery which they feel is related to her diagnosis but he seems to be at peace with that.  He has a lot on his plate with medical appointments for himself his wife and his daughter but he feels that he is managing that as well as he can.  He continues to push toward additional disability from the [name] and the physical therapy is taking they feel strongly related to his time there and thinks that will help with the disability process.  He continues to rely on his faith and that strength in his family.  He is doing his best to separate some time for himself. He does contract for safety having no thoughts of hurting himself or anyone else.  Reported Symptoms: Anxiety/irritability  Mental Status Exam: Appearance:  Fairly Groomed     Behavior: Appropriate  Motor: Normal  Speech/Language:  Normal Rate   Affect: Appropriate  Mood: normal  Thought process: normal  Thought content:   WNL  Sensory/Perceptual disturbances:   WNL  Orientation: oriented to person, place, time/date, situation, day of week, month of year, and year  Attention: Good  Concentration: Good  Memory: WNL  Fund of knowledge:  Good  Insight:   Good  Judgment:  Good  Impulse Control: Good   Risk Assessment: Danger to Self:  No Self-injurious Behavior: No Danger to Others: No Duty to Warn:no Physical Aggression / Violence:No  Access to Firearms a concern: No  Gang Involvement:No   Subjective: Interventions: Cognitive Behavioral Therapy and Dialectical Behavioral Therapy  Diagnosis: Generalized anxiety disorder, irritability  Plan: I will meet with the patient virtually every 2 to 3 weeks.  Target date December 19, 2022.  Progress: 40% Goals will be to improve the patient's ability to manage anxiety and stress in a healthier way, identify causes for anxiety and explore ways to lower them, resolve any core conflicts contributing to anxiety and help the patient manage worrisome thoughts and thinking's contributing to stress and anxiety.  Interventions will include providing education about anxiety and stress to help him identify its causes, facilitate problem solution skills as well as teach coping skills to manage anxiety symptoms such as grounding exercises, progressive muscle relaxation etc.  We will also use cognitive behavioral therapy to identify and change anxiety provoking thought and behavior patterns  as well as use dialectical behavior therapy distress tolerance and mindfulness skills to help him learn better anxiety management skills.  I reviewed the goals for the patient and he would like to continue as goals stated above.  New target date is December 19, 2023. Lorrene CHRISTELLA Hasten, [facility]                Lorrene CHRISTELLA Hasten, [facility]               Lorrene CHRISTELLA Hasten,  [facility]               Lorrene CHRISTELLA Hasten, [facility]               Lorrene CHRISTELLA Hasten, [facility]               Lorrene CHRISTELLA Hasten, [facility]               Lorrene CHRISTELLA Hasten, [facility]               Lorrene CHRISTELLA Hasten, [facility]               Lorrene CHRISTELLA Hasten, [facility]               Lorrene CHRISTELLA Hasten, [facility]               Lorrene CHRISTELLA Hasten, [facility]               Lorrene CHRISTELLA Hasten, [facility]           Lorrene CHRISTELLA Hasten, [facility]               Lorrene CHRISTELLA Hasten, [facility]               Lorrene CHRISTELLA Hasten, [facility]               Lorrene CHRISTELLA Hasten, [facility]               Lorrene CHRISTELLA Hasten, [facility]               Lorrene CHRISTELLA Hasten, [facility]               Lorrene CHRISTELLA Hasten, [facility]               Lorrene CHRISTELLA Hasten, [facility]               Lorrene CHRISTELLA Hasten, [facility]               Lorrene CHRISTELLA Hasten, [facility]               Lorrene CHRISTELLA Hasten, [facility]

## 2023-12-03 ENCOUNTER — Encounter: Payer: Self-pay | Admitting: Internal Medicine

## 2023-12-04 ENCOUNTER — Ambulatory Visit (INDEPENDENT_AMBULATORY_CARE_PROVIDER_SITE_OTHER): Admitting: Behavioral Health

## 2023-12-04 ENCOUNTER — Encounter: Payer: Self-pay | Admitting: Behavioral Health

## 2023-12-04 DIAGNOSIS — F411 Generalized anxiety disorder: Secondary | ICD-10-CM

## 2023-12-04 DIAGNOSIS — F331 Major depressive disorder, recurrent, moderate: Secondary | ICD-10-CM

## 2023-12-04 DIAGNOSIS — R454 Irritability and anger: Secondary | ICD-10-CM

## 2023-12-04 NOTE — Progress Notes (Addendum)
 [facility] Counselor/Therapist Progress Note  Patient ID: Randy Hendrix, MRN: [id number],    Date: 12/04/2023  Time Spent: 11 AM to 11:58 AM, 58 minutes.  This session was held via video teletherapy. The patient consented to the video teletherapy and was located in his car during this session. He is aware it is the responsibility of the patient to secure confidentiality on his end of the session. The provider was in a private home office for the duration of this session.      Treatment Type: Individual Therapy The patient has had the week off from work but that has been so busy going to appointments and doing other things that he has not had much time to slow down.  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  He continues to do physical therapy which he feels is helpful but he still has pain in his feet knees hips back and he says he feels it takes turns which hurts the most which today.  He continues to do physical therapy at home to help with his balance.  His daughter has another surgery on December 5 so they are preparing for that.  He feels that they are doing what they can to minimize stress in her life financially and otherwise and he is using coping skills.  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  At times he has dreams about being in a place where he can get out of but they have diminished over the years.  We will process that as he feels comfortable doing so.  He does contract for safety having no thoughts of hurting himself or anyone else.  Reported Symptoms: Anxiety/irritability  Mental Status Exam: Appearance:  Fairly Groomed     Behavior: Appropriate  Motor: Normal  Speech/Language:  Normal Rate  Affect: Appropriate  Mood: normal   Thought process: normal  Thought content:   WNL  Sensory/Perceptual disturbances:   WNL  Orientation: oriented to person, place, time/date, situation, day of week, month of year, and year  Attention: Good  Concentration: Good  Memory: WNL  Fund of knowledge:  Good  Insight:   Good  Judgment:  Good  Impulse Control: Good   Risk Assessment: Danger to Self:  No Self-injurious Behavior: No Danger to Others: No Duty to Warn:no Physical Aggression / Violence:No  Access to Firearms a concern: No  Gang Involvement:No   Subjective: Interventions: Cognitive Behavioral Therapy and Dialectical Behavioral Therapy  Diagnosis: Generalized anxiety disorder, irritability  Plan: I will meet with the patient virtually every 2 to 3 weeks.  Target date December 19, 2022.  Progress: 40% Goals will be to improve the patient's ability to manage anxiety and stress in a healthier way, identify causes for anxiety and explore ways to lower them, resolve any core conflicts contributing to anxiety and help the patient manage worrisome thoughts and thinking's contributing to stress and anxiety.  Interventions will include providing education about anxiety and stress to help him identify its causes, facilitate problem solution skills as well as teach coping skills to manage anxiety symptoms such as grounding exercises, progressive muscle relaxation etc.  We will also use cognitive behavioral therapy to identify and change anxiety provoking thought and behavior patterns  as well as use dialectical behavior therapy distress tolerance and mindfulness skills to help him learn better anxiety management skills.  I reviewed the goals for the patient and he would like to continue as goals stated above.  New target date is December 19, 2023. Lorrene CHRISTELLA Hasten, [facility]                Lorrene CHRISTELLA Hasten, [facility]               Lorrene CHRISTELLA Hasten, [facility]               Lorrene CHRISTELLA Hasten,  [facility]               Lorrene CHRISTELLA Hasten, [facility]               Lorrene CHRISTELLA Hasten, [facility]               Lorrene CHRISTELLA Hasten, [facility]               Lorrene CHRISTELLA Hasten, [facility]               Lorrene CHRISTELLA Hasten, [facility]               Lorrene CHRISTELLA Hasten, [facility]               Lorrene CHRISTELLA Hasten, [facility]               Lorrene CHRISTELLA Hasten, [facility]           Lorrene CHRISTELLA Hasten, [facility]               Lorrene CHRISTELLA Hasten, [facility]               Lorrene CHRISTELLA Hasten, [facility]               Lorrene CHRISTELLA Hasten, [facility]               Lorrene CHRISTELLA Hasten, [facility]               Lorrene CHRISTELLA Hasten, [facility]               Lorrene CHRISTELLA Hasten, [facility]               Lorrene CHRISTELLA Hasten, [facility]               Lorrene CHRISTELLA Hasten, [facility]               Lorrene CHRISTELLA Hasten, [facility]               Lorrene CHRISTELLA Hasten, [facility]               Lorrene CHRISTELLA Hasten, [facility]

## 2023-12-04 NOTE — Telephone Encounter (Signed)
This has been placed

## 2023-12-04 NOTE — Addendum Note (Signed)
 Addended by: ROSALVA LEX RAMAN on: 12/04/2023 08:02 AM   Modules accepted: Orders

## 2023-12-09 ENCOUNTER — Ambulatory Visit: Admitting: Podiatry

## 2023-12-09 VITALS — Ht 70.0 in | Wt 317.0 lb

## 2023-12-09 DIAGNOSIS — M66872 Spontaneous rupture of other tendons, left ankle and foot: Secondary | ICD-10-CM | POA: Diagnosis not present

## 2023-12-09 NOTE — Progress Notes (Signed)
 Subjective:  Patient ID: Randy Hendrix, male    DOB: [birth year],  MRN: [id number]  Chief Complaint  Patient presents with   Tendonitis   Diabetic Ulcer    RM 2 Patient is here fot /u on pttd of the left foot. Pt states support was too tight. Pt states pt has provided some relief with the pain.    Discussed the use of AI scribe software for clinical note transcription with the patient, who gave verbal consent to proceed.  History of Present Illness Randy Hendrix is a 58 year old male who presents with ankle pain and swelling.  He experiences worsening pain in his ankle, and reports a history of knee issues that began during his time in the [name]. The pain is rhythmic, starting at the ankle and extending downward, with tenderness when pressure is applied to specific areas of the ankle. He wears regular insoles with his boots for work and experiences pain when pulling his foot back towards him and pushing in certain directions.  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 He wears boots that go above the ankle, providing some support. He is currently undergoing physical therapy for his knee at [facility] and wants his foot worked on as well. He sees Dr. May every nine weeks for follow-up on his diabetic foot exams and care.  He reports soreness at the bottom and side of his foot, which has improved but still persists, along with swelling in the area. He has a history of flat feet, a family trait, and experiences tightness in his Achilles tendon, similar to other family members.  He reports moisture between his toes.    Interval history: He returns notes little change in symptoms.  Ankle brace did not fit correctly and has not helped      Objective:    Physical Exam VASCULAR: DP and PT pulse palpable. Foot is warm and well-perfused.  Capillary fill time is brisk. DERMATOLOGIC: Xerosis cutis noted. Interdigital tinea pedis of multiple areas. Thick and elongated mycotic nails. NEUROLOGIC: Normal sensation to light touch and pressure. No paresthesias on examination. ORTHOPEDIC: Pain and swelling along medial posterior ankle, posterior tibial tendon. Pain with resisted eversion, plantar flexion, and dorsiflexion. Able to do double heel rise, partial single heel rise with pain. Pes planus deformity noted. No ecchymosis or bruising. No gross deformity.   No images are attached to the encounter.    Results RADIOLOGY Left ankle radiograph: No fracture or stress fracture. Pes planus deformity. Ankle mortise well aligned and intact without visible osteochondral defect (OCD) lesions. (10/28/2023)   Assessment:   1. Nontraumatic rupture of left posterior tibial tendon      Plan:  Patient was evaluated and treated and all questions answered.  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 Pain with resisted eversion, plantar flexion, and dorsiflexion. Able to perform a double heel rise and partially able to do a single heel rise with pain. X-ray shows no fracture or stress fracture, and the ankle mortis is well aligned.  -Little change in symptoms.  Bracing has been difficult.  We looked at the option of an over the shoe Tayco brace he is not able to wear CAM boot  for work at this point.  We do not have a appropriate sized Tayco brace for him today we will order an extra-large and have him return for fitting once this has been completed.  With little improvement in symptoms I recommended an MRI to evaluate for the possibility of tearing.  This has been ordered and I will see him back following the study.  We  discussed use of a prednisone  taper to reduce inflammation would like to avoid this for now due to the risk of hyperglycemia he will let me know if it worsens and requires this.       Return for after MRI to review.

## 2023-12-09 NOTE — Patient Instructions (Signed)

## 2023-12-19 ENCOUNTER — Ambulatory Visit
Admission: RE | Admit: 2023-12-19 | Discharge: 2023-12-19 | Disposition: A | Discharge status: Home / Self Care | Source: Ambulatory Visit | Attending: Podiatry | Admitting: Podiatry

## 2023-12-19 DIAGNOSIS — M66872 Spontaneous rupture of other tendons, left ankle and foot: Secondary | ICD-10-CM

## 2023-12-21 ENCOUNTER — Other Ambulatory Visit: Payer: Self-pay | Admitting: Internal Medicine

## 2023-12-22 ENCOUNTER — Encounter: Payer: Self-pay | Admitting: Radiology

## 2023-12-26 ENCOUNTER — Ambulatory Visit: Admitting: Behavioral Health

## 2023-12-26 ENCOUNTER — Encounter: Payer: Self-pay | Admitting: Behavioral Health

## 2023-12-26 DIAGNOSIS — F331 Major depressive disorder, recurrent, moderate: Secondary | ICD-10-CM

## 2023-12-26 DIAGNOSIS — R454 Irritability and anger: Secondary | ICD-10-CM

## 2023-12-26 DIAGNOSIS — F411 Generalized anxiety disorder: Secondary | ICD-10-CM | POA: Diagnosis not present

## 2023-12-26 NOTE — Progress Notes (Addendum)
 [facility] Counselor/Therapist Progress Note  Patient ID: Randy Hendrix, MRN: [id number],    Date: 12/26/2023  Time Spent: 2:02 PM until 2:58 PM, 56 minutes.  This session was held via video teletherapy. The patient consented to the video teletherapy and was located in his car during this session. He is aware it is the responsibility of the patient to secure confidentiality on his end of the session. The provider was in a private home office for the duration of this session.      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  Although he has a fairly good team he said they could still use another person but the company cannot justify that.  He is doing the best that he can to compartmentalize while at work.  Physically he continues to go through physical therapy which she says is tough in the midst but he is optimistic it will help with some of the pain and mobility.  His daughter has surgery on December 5 so they are preparing for that in many different ways.  He consents anxiety and stress in his wife so they are trying very hard to keep things as calm and as smooth as possible.  He says they are constantly working on understanding and being better communicators with each other.  I encouraged continued use of coping skills for reduction of stress. He does contract for safety having no thoughts of hurting himself or anyone else.  Reported Symptoms: Anxiety/irritability  Mental Status Exam: Appearance:  Fairly Groomed     Behavior: Appropriate  Motor: Normal  Speech/Language:  Normal Rate  Affect: Appropriate  Mood: normal  Thought process: normal  Thought content:   WNL  Sensory/Perceptual disturbances:   WNL  Orientation: oriented to person, place, time/date, situation, day of week, month of year,  and year  Attention: Good  Concentration: Good  Memory: WNL  Fund of knowledge:  Good  Insight:   Good  Judgment:  Good  Impulse Control: Good   Risk Assessment: Danger to Self:  No Self-injurious Behavior: No Danger to Others: No Duty to Warn:no Physical Aggression / Violence:No  Access to Firearms a concern: No  Gang Involvement:No   Subjective: Interventions: Cognitive Behavioral Therapy and Dialectical Behavioral Therapy  Diagnosis: Generalized anxiety disorder, irritability  Plan: I will meet with the patient virtually every 2 to 3 weeks.  Target date December 19, 2022.  Progress: 40% Goals will be to improve the patient's ability to manage anxiety and stress in a healthier way, identify causes for anxiety and explore ways to lower them, resolve any core conflicts contributing to anxiety and help the patient manage worrisome thoughts and thinking's contributing to stress and anxiety.  Interventions will include providing education about anxiety and stress to help him identify its causes, facilitate problem solution skills as well as teach coping skills to manage anxiety symptoms such as grounding exercises, progressive muscle relaxation etc.  We will also use cognitive behavioral therapy to identify and change anxiety provoking thought and behavior patterns as well as use dialectical behavior therapy distress tolerance and mindfulness skills to help him learn better anxiety management skills.  I reviewed the goals for the patient and he would like to continue as goals stated above.  December 18, 2024 Target date  Progress: 40% Lorrene CHRISTELLA Hasten, [facility]  Lorrene CHRISTELLA Hasten, [facility]               Lorrene CHRISTELLA Hasten, [facility]               Lorrene CHRISTELLA Hasten, [facility]               Lorrene CHRISTELLA Hasten, [facility]               Lorrene CHRISTELLA Hasten, [facility]               Lorrene CHRISTELLA Hasten, [facility]               Lorrene CHRISTELLA Hasten, [facility]               Lorrene CHRISTELLA Hasten, [facility]               Lorrene CHRISTELLA Hasten, [facility]               Lorrene CHRISTELLA Hasten, [facility]               Lorrene CHRISTELLA Hasten, [facility]           Lorrene CHRISTELLA Hasten, [facility]               Lorrene CHRISTELLA Hasten, [facility]               Lorrene CHRISTELLA Hasten, [facility]               Lorrene CHRISTELLA Hasten, [facility]               Lorrene CHRISTELLA Hasten, [facility]               Lorrene CHRISTELLA Hasten, [facility]               Lorrene CHRISTELLA Hasten, [facility]               Lorrene CHRISTELLA Hasten, [facility]               Lorrene CHRISTELLA Hasten, [facility]               Lorrene CHRISTELLA Hasten, [facility]               Lorrene CHRISTELLA Hasten, [facility]               Lorrene CHRISTELLA Hasten, [facility]               Lorrene CHRISTELLA Hasten, [facility]

## 2023-12-28 ENCOUNTER — Ambulatory Visit: Payer: Self-pay | Admitting: Podiatry

## 2023-12-30 ENCOUNTER — Encounter: Payer: Self-pay | Admitting: Podiatry

## 2023-12-30 ENCOUNTER — Ambulatory Visit: Admitting: Podiatry

## 2023-12-30 DIAGNOSIS — M79674 Pain in right toe(s): Secondary | ICD-10-CM | POA: Diagnosis not present

## 2023-12-30 DIAGNOSIS — E119 Type 2 diabetes mellitus without complications: Secondary | ICD-10-CM

## 2023-12-30 DIAGNOSIS — L84 Corns and callosities: Secondary | ICD-10-CM | POA: Diagnosis not present

## 2023-12-30 DIAGNOSIS — M79675 Pain in left toe(s): Secondary | ICD-10-CM | POA: Diagnosis not present

## 2023-12-30 DIAGNOSIS — B351 Tinea unguium: Secondary | ICD-10-CM

## 2024-01-06 ENCOUNTER — Encounter: Payer: Self-pay | Admitting: Podiatry

## 2024-01-06 NOTE — Progress Notes (Signed)
  Subjective:  Patient ID: Randy Hendrix, male    DOB: 01/01/1966,  MRN: [id number]  Randy Hendrix presents to clinic today for for annual diabetic foot examination, preventative diabetic foot care, and callus(es) b/l lower extremities and painful thick toenails that are difficult to trim. Painful toenails interfere with ambulation. Aggravating factors include wearing enclosed shoe gear. Pain is relieved with periodic professional debridement. Painful calluses are aggravated when weightbearing with and without shoegear. Pain is relieved with periodic professional debridement. 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 Chief Complaint  Patient presents with   Nail Problem    Thick painful toenails, 3 month follow up    Diabetes    A1C 6.1   New problem(s): None.   PCP is Rollene Almarie LABOR, MD.  No Known Allergies  Review of Systems: Negative except as noted in the HPI.  Objective: No changes noted in today's physical examination. There were no vitals filed for this visit. Randy Hendrix is a pleasant 58 y.o. male morbidly obese in NAD. AAO x 3.  Vascular Examination: Vascular status intact b/l with palpable pedal pulses. CFT immediate b/l. Pedal hair present. No edema. No pain with calf compression b/l. Skin temperature gradient WNL b/l. No varicosities noted. No cyanosis or clubbing noted.  Neurological Examination: Sensation grossly intact b/l with 10 gram monofilament. Vibratory sensation intact b/l.  Dermatological Examination: Pedal skin with normal turgor, texture and tone b/l. No open wounds nor interdigital macerations noted. Toenails 1-5 b/l thick, discolored, elongated with subungual debris and pain on dorsal palpation.   Hyperkeratotic lesion(s) submet head 5 left foot and submet head 5 right foot.  No erythema, no edema, no drainage, no fluctuance.  Musculoskeletal Examination: Muscle strength 5/5 to b/l LE.  No pain, crepitus noted b/l. Pes  planus b/l. Patient ambulates independently without assistive aids.   Radiographs: None.  Assessment/Plan: 1. Pain due to onychomycosis of toenails of both feet   2. Callus   3. Type 2 diabetes mellitus without complication, without long-term current use of insulin  (HCC)     Diabetic foot examination performed today. All patient's and/or POA's questions/concerns addressed on today's visit. Toenails 1-5 b/l debrided in length and girth without incident. Callus(es) submet head 5 b/l pared with sharp debridement without incident. Continue daily foot inspections and monitor blood glucose per PCP/Endocrinologist's recommendations.Continue soft, supportive shoe gear daily. Report any pedal injuries to medical professional. Call office if there are any questions/concerns.  Return in about 9 months (around 09/28/2024).  Randy Hendrix, DPM      [place] LOCATION: [address], [place] [address]                   Office [phone]   [facility] LOCATION: [address] [place], [place] [address] Office [phone]

## 2024-01-10 ENCOUNTER — Other Ambulatory Visit: Payer: Self-pay | Admitting: Internal Medicine

## 2024-01-18 ENCOUNTER — Other Ambulatory Visit: Payer: Self-pay | Admitting: Internal Medicine

## 2024-01-22 ENCOUNTER — Ambulatory Visit: Payer: Self-pay | Attending: Internal Medicine | Admitting: Audiologist

## 2024-01-22 ENCOUNTER — Ambulatory Visit: Admitting: Behavioral Health

## 2024-01-22 ENCOUNTER — Encounter: Payer: Self-pay | Admitting: Behavioral Health

## 2024-01-22 ENCOUNTER — Encounter: Payer: Self-pay | Admitting: Internal Medicine

## 2024-01-22 DIAGNOSIS — F411 Generalized anxiety disorder: Secondary | ICD-10-CM

## 2024-01-22 DIAGNOSIS — H903 Sensorineural hearing loss, bilateral: Secondary | ICD-10-CM | POA: Insufficient documentation

## 2024-01-22 DIAGNOSIS — H9193 Unspecified hearing loss, bilateral: Secondary | ICD-10-CM | POA: Insufficient documentation

## 2024-01-22 NOTE — Procedures (Signed)
  Outpatient Audiology and [facility] [address] 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 Randy Hendrix denies pain or pain in each each. 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 Randy Hendrix has history of hazardous noise exposure.  Medical history shows no additional risk for hearing loss.    Evaluation:  Otoscopy showed a clear view of the tympanic membranes, bilaterally Tympanometry results were consistent with normal middle ear function, bilaterally   Audiometric testing was completed using Conventional Audiometry techniques with insert earphones and supraural headphones. Test results are consistent with normal hearing sloping to mild loss by 8kHz bilaterally. Speech Recognition Thresholds were obtained at 25 dB HL in the right ear and at 25  dB HL in the left ear. Word Recognition Testing was completed at  40dB SL and Randy Hendrix scored 100% in the right ear and 96% in the left ear using [place]  CNC list.    Results:  The test results were reviewed with Randy Hendrix and his wife. Randy Hendrix has a mild degree of hearing loss. He does not need hearing aids.  Audiogram printed and provided to [name] to submit to the [place].  Recommendations: Annual audiometric testing recommended to monitor hearing loss for progression. [facility]   27 minutes spent testing and counseling on results.   If you have any questions please feel free to contact me at [phone].  Lauraine Ka Stalnaker Au.D.  Audiologist    01/22/2024  1:29 PM  Cc: Rollene Almarie LABOR, MD

## 2024-01-22 NOTE — Progress Notes (Signed)
 [facility] Counselor/Therapist Progress Note  Patient ID: Randy Hendrix, MRN: [id number],    Date: 01/22/2024  Time Spent: 11:02 AM until 11:56 AM, 54 minutes.  This session was held via video teletherapy. The patient consented to the video teletherapy and was located in his car during this session. He is aware it is the responsibility of the patient to secure confidentiality on his end of the session. The provider was in a private home office for the duration of this session.      Treatment Type: Individual Therapy  The patient is under significant stress.  His daughter surgery is tomorrow.  They have been very busy today preparing for that because when she comes home she will not be able to do much for herself at least for a couple of weeks until she can get onto knee scooter.  Because of the way the home is laid out the daughter will be upstairs so he and his wife will have to take meals upstairs to her the daughter.  This is the second time she has had to have the surgery so they understand what to expect but they are still anxiety associated with that.  Knows that they are controlling all that they can and a lot of people care about them and are praying for them.  There is also significant work stress with a very busy time at work including an end of the year project which she says in part affects their end of your bonus.  He feels that he is managing his stress as well as he can at work.  He claims that he is using a lot of breathing and grounding exercises to both work at home.  I encouraged him to try to find small ways to offset some of the stress.  He continues to chip away at submitting more documentation to the [place] for disability.  He is going to get a hearing test which will contribute to his case and works on the other paperwork as he and his wife at time.  He continues physical therapy which she says helps but also can be very tiring and painful. He does contract  for safety having no thoughts of hurting himself or anyone else.  Reported Symptoms: Anxiety/irritability  Mental Status Exam: Appearance:  Fairly Groomed     Behavior: Appropriate  Motor: Normal  Speech/Language:  Normal Rate  Affect: Appropriate  Mood: normal  Thought process: normal  Thought content:   WNL  Sensory/Perceptual disturbances:   WNL  Orientation: oriented to person, place, time/date, situation, day of week, month of year, and year  Attention: Good  Concentration: Good  Memory: WNL  Fund of knowledge:  Good  Insight:   Good  Judgment:  Good  Impulse Control: Good   Risk Assessment: Danger to Self:  No Self-injurious Behavior: No Danger to Others: No Duty to Warn:no Physical Aggression / Violence:No  Access to Firearms a concern: No  Gang Involvement:No   Subjective: Interventions: Cognitive Behavioral Therapy and Dialectical Behavioral Therapy  Diagnosis: Generalized anxiety disorder, irritability  Plan: I will meet with the patient virtually every 2 to 3 weeks.  Target date December 19, 2022.  Progress: 40% Goals will be to improve the patient's ability to manage anxiety and stress in a healthier way, identify causes for anxiety and explore ways to lower them, resolve any core conflicts contributing to anxiety and help the patient manage worrisome thoughts and thinking's contributing to stress and anxiety.  Interventions  will include providing education about anxiety and stress to help him identify its causes, facilitate problem solution skills as well as teach coping skills to manage anxiety symptoms such as grounding exercises, progressive muscle relaxation etc.  We will also use cognitive behavioral therapy to identify and change anxiety provoking thought and behavior patterns as well as use dialectical behavior therapy distress tolerance and mindfulness skills to help him learn better anxiety management skills.  I reviewed the goals for the patient and he  would like to continue as goals stated above.  December 18, 2024 Target date  Progress: 40% Lorrene CHRISTELLA Hasten, [facility]                Lorrene CHRISTELLA Hasten, [facility]               Lorrene CHRISTELLA Hasten, [facility]               Lorrene CHRISTELLA Hasten, [facility]               Lorrene CHRISTELLA Hasten, [facility]               Lorrene CHRISTELLA Hasten, [facility]               Lorrene CHRISTELLA Hasten, [facility]               Lorrene CHRISTELLA Hasten, [facility]               Lorrene CHRISTELLA Hasten, [facility]               Lorrene CHRISTELLA Hasten, [facility]               Lorrene CHRISTELLA Hasten, [facility]               Lorrene CHRISTELLA Hasten, [facility]           Lorrene CHRISTELLA Hasten, [facility]               Lorrene CHRISTELLA Hasten, [facility]               Lorrene CHRISTELLA Hasten, [facility]               Lorrene CHRISTELLA Hasten, [facility]               Lorrene CHRISTELLA Hasten, [facility]               Lorrene CHRISTELLA Hasten, [facility]               Lorrene CHRISTELLA Hasten, [facility]               Lorrene CHRISTELLA Hasten, [facility]               Lorrene CHRISTELLA Hasten, [facility]               Lorrene CHRISTELLA Hasten, [facility]               Lorrene CHRISTELLA Hasten, [facility]               Lorrene CHRISTELLA Hasten, [facility]               Lorrene CHRISTELLA Hasten, [facility]               Lorrene CHRISTELLA Hasten, [facility]

## 2024-02-26 ENCOUNTER — Ambulatory Visit: Admitting: Behavioral Health

## 2024-03-02 ENCOUNTER — Encounter: Payer: Self-pay | Admitting: Podiatry

## 2024-03-02 ENCOUNTER — Ambulatory Visit: Admitting: Podiatry

## 2024-03-02 DIAGNOSIS — M79675 Pain in left toe(s): Secondary | ICD-10-CM

## 2024-03-02 DIAGNOSIS — B351 Tinea unguium: Secondary | ICD-10-CM | POA: Diagnosis not present

## 2024-03-02 DIAGNOSIS — L84 Corns and callosities: Secondary | ICD-10-CM

## 2024-03-02 DIAGNOSIS — M79674 Pain in right toe(s): Secondary | ICD-10-CM

## 2024-03-02 DIAGNOSIS — E119 Type 2 diabetes mellitus without complications: Secondary | ICD-10-CM

## 2024-03-08 NOTE — Progress Notes (Addendum)
"  °  Subjective:  Patient ID: Randy Hendrix, male    DOB: 1965/06/11,  MRN: [id number]  Josip Earl Reaume presents to clinic today for preventative diabetic foot care and callus(es) of both feet and painful mycotic toenails that are difficult to trim. Painful toenails interfere with ambulation. Aggravating factors include wearing enclosed shoe gear. Pain is relieved with periodic professional debridement. Painful calluses are aggravated when weightbearing with and without shoegear. Pain is relieved with periodic professional debridement.  Chief Complaint  Patient presents with   Diabetes    DFC NIDDM A1C 6.1. Toenail trim and callus left foot plantar. LOV with PCP 11/17/23.   New problem(s): None.   PCP is Rollene Almarie LABOR, MD.  Allergies[1]  Review of Systems: Negative except as noted in the HPI.  Objective: No changes noted in today's physical examination. There were no vitals filed for this visit. Zyen Aster Eckrich is a pleasant 59 y.o. male morbidly obese in NAD. AAO x 3.  Vascular Examination: Vascular status intact b/l with palpable pedal pulses. CFT immediate b/l. Pedal hair present. No edema. No pain with calf compression b/l. Skin temperature gradient WNL b/l. No varicosities noted. No cyanosis or clubbing noted.  Neurological Examination: Sensation grossly intact b/l with 10 gram monofilament. Vibratory sensation intact b/l.  Dermatological Examination: Pedal skin with normal turgor, texture and tone b/l. No open wounds nor interdigital macerations noted. Toenails 1-5 b/l thick, discolored, elongated with subungual debris and pain on dorsal palpation.   Hyperkeratotic lesion(s) submet head 5 left foot and submet head 5 right foot.  No erythema, no edema, no drainage, no fluctuance.  Musculoskeletal Examination: Muscle strength 5/5 to b/l LE.  No pain, crepitus noted b/l. Pes planus b/l. Patient ambulates independently without assistive aids.   Radiographs:  None. Assessment/Plan: 1. Pain due to onychomycosis of toenails of both feet   2. Callus   3. Type 2 diabetes mellitus without complication, without long-term current use of insulin  [facility])    Consent given for treatment. Patient examined. All patient's and/or POA's questions/concerns addressed on today's visit. Mycotic toenails 1-5 b/l  debrided in length and girth without incident. Treatment was provided by assistant Andrez Albany under my supervision. Callus(es) submet head 5 b/l pared with sharp debridement without incident. Continue foot and shoe inspections daily. Monitor blood glucose per PCP/Endocrinologist's recommendations.Continue soft, supportive shoe gear daily. Report any pedal injuries to medical professional. Call office if there are any quesitons/concerns.  Return in about 9 weeks (around 05/04/2024).  Delon LITTIE Merlin, DPM      [facility] LOCATION: [address], [place] [address]                   Office [phone]   [facility] LOCATION: [address] [place], [place] [address] Office [phone]      [1] No Known Allergies  "

## 2024-03-11 ENCOUNTER — Other Ambulatory Visit: Payer: Self-pay | Admitting: Internal Medicine

## 2024-03-18 ENCOUNTER — Ambulatory Visit: Admitting: Behavioral Health

## 2024-03-18 ENCOUNTER — Encounter: Payer: Self-pay | Admitting: Behavioral Health

## 2024-03-18 DIAGNOSIS — F411 Generalized anxiety disorder: Secondary | ICD-10-CM

## 2024-03-18 DIAGNOSIS — R454 Irritability and anger: Secondary | ICD-10-CM | POA: Diagnosis not present

## 2024-03-18 DIAGNOSIS — F331 Major depressive disorder, recurrent, moderate: Secondary | ICD-10-CM

## 2024-03-18 NOTE — Progress Notes (Signed)
 " [facility] Counselor/Therapist Progress Note  Patient ID: Randy Hendrix, MRN: [id number],    Date: 03/18/2024  Time Spent: 11:00 AM until 11:58 AM, 58 minutes.  This session was held via video teletherapy. The patient consented to the video teletherapy and was located in his car during this session. He is aware it is the responsibility of the patient to secure confidentiality on his end of the session. The provider was in a private home office for the duration of this session.      Treatment Type: Individual Therapy Patient's daughter surgery went well but she still cannot put any weight on her foot and follows up with the doctor sometime in February.  Still requires he has wife helping her more until she is for basically able to do more.  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  His son does help out when he can.  The biggest stressor right now is that his job at a meeting recently and they are changing hours significantly.  His options are 6 in the morning to 6:30 in the evening, 6:30 in the evening until 6:00 in the morning or in a different building at 6 AM until 2:30 PM.  The 6 AM to 2:30 PM would be better but he feels fairly strongly that they are closing that building down soon and that shifts would go away.  He is aware of how much wear and tear that would be on his body for 12-hour shifts but those are the only other options.  The night shift is not easy because he would have to sleep during the day.  The dayshift would give him a couple of days off but he would have to work at least a part of every other weekend and right now he has every Saturday off as well as 3 days throughout the week.  He said everybody felt as if they were blindsided and not listen to and they did not have a lot of answers to employees questions.  He has to submit his priority beds  on which shift he wants in the next couple of weeks and it may not even be implemented until the end of the year.  That they have already decided who they were aware of this is just a formality which is even more frustrating.  He and his wife are sitting down to talk about it and pray about it but he is not ruling out any other options depending on what the outcome will be.  I encouraged continued use of coping skills especially at a very stressful time for he and his family.  I validated that he is a good husband and father takes great care of his wife and daughter and understand how much that plays into the decision that he will make for the work situation.  He does contract for safety having no thoughts of hurting himself or anyone else.  Reported Symptoms: Anxiety/irritability  Mental Status Exam: Appearance:  Fairly Groomed     Behavior: Appropriate  Motor: Normal  Speech/Language:  Normal Rate  Affect: Appropriate  Mood: normal  Thought process: normal  Thought content:   WNL  Sensory/Perceptual disturbances:   WNL  Orientation: oriented to person, place, time/date, situation, day of week, month of year, and year  Attention: Good  Concentration: Good  Memory: WNL  Fund  of knowledge:  Good  Insight:   Good  Judgment:  Good  Impulse Control: Good   Risk Assessment: Danger to Self:  No Self-injurious Behavior: No Danger to Others: No Duty to Warn:no Physical Aggression / Violence:No  Access to Firearms a concern: No  Gang Involvement:No   Subjective: Interventions: Cognitive Behavioral Therapy and Dialectical Behavioral Therapy  Diagnosis: Generalized anxiety disorder, irritability  Plan: I will meet with the patient virtually every 2 to 3 weeks.  Target date December 19, 2022.  Progress: 40% Goals will be to improve the patient's ability to manage anxiety and stress in a healthier way, identify causes for anxiety and explore ways to lower them, resolve any core conflicts  contributing to anxiety and help the patient manage worrisome thoughts and thinking's contributing to stress and anxiety.  Interventions will include providing education about anxiety and stress to help him identify its causes, facilitate problem solution skills as well as teach coping skills to manage anxiety symptoms such as grounding exercises, progressive muscle relaxation etc.  We will also use cognitive behavioral therapy to identify and change anxiety provoking thought and behavior patterns as well as use dialectical behavior therapy distress tolerance and mindfulness skills to help him learn better anxiety management skills.  I reviewed the goals for the patient and he would like to continue as goals stated above.  December 18, 2024 Target date  Progress: 40% Lorrene CHRISTELLA Hasten, [facility]                Lorrene CHRISTELLA Hasten, [facility]               Lorrene CHRISTELLA Hasten, [facility]               Lorrene CHRISTELLA Hasten, [facility]               Lorrene CHRISTELLA Hasten, [facility]               Lorrene CHRISTELLA Hasten, [facility]               Lorrene CHRISTELLA Hasten, [facility]               Lorrene CHRISTELLA Hasten, [facility]               Lorrene CHRISTELLA Hasten, [facility]               Lorrene CHRISTELLA Hasten, [facility]               Lorrene CHRISTELLA Hasten, [facility]               Lorrene CHRISTELLA Hasten, [facility]           Lorrene CHRISTELLA Hasten, [facility]               Lorrene CHRISTELLA Hasten, [facility]               Lorrene CHRISTELLA Hasten, [facility]               Lorrene CHRISTELLA Hasten, [facility]               Lorrene CHRISTELLA Hasten, [facility]               Lorrene CHRISTELLA Hasten, [facility]               Lorrene CHRISTELLA Hasten, [facility]               Lorrene CHRISTELLA Hasten, [facility]               Lorrene  CHRISTELLA Hasten, [facility]               Lorrene CHRISTELLA Hasten, [facility]               Lorrene CHRISTELLA Hasten,  [facility]               Lorrene CHRISTELLA Hasten, [facility]               Lorrene CHRISTELLA Hasten, [facility]               Lorrene CHRISTELLA Hasten, [facility]               Lorrene CHRISTELLA Hasten, [facility] "

## 2024-04-02 ENCOUNTER — Ambulatory Visit: Payer: Self-pay | Admitting: Internal Medicine

## 2024-04-08 ENCOUNTER — Ambulatory Visit: Admitting: Behavioral Health

## 2024-04-28 ENCOUNTER — Ambulatory Visit: Admitting: Podiatry

## 2024-04-30 ENCOUNTER — Ambulatory Visit: Admitting: Behavioral Health

## 2024-06-30 ENCOUNTER — Ambulatory Visit: Admitting: Podiatry

## 2024-11-17 ENCOUNTER — Encounter: Admitting: Internal Medicine
